# Patient Record
Sex: Male | Born: 1935 | Race: White | Hispanic: No | Smoking: Former smoker
Health system: Southern US, Community
[De-identification: ages and names within clinical notes are randomized; demographics above are authoritative.]

## PROBLEM LIST (undated history)

## (undated) DIAGNOSIS — C9200 Acute myeloblastic leukemia, not having achieved remission: Principal | ICD-10-CM

## (undated) DIAGNOSIS — D61818 Other pancytopenia: Secondary | ICD-10-CM

## (undated) DIAGNOSIS — L309 Dermatitis, unspecified: Secondary | ICD-10-CM

## (undated) DIAGNOSIS — I2510 Atherosclerotic heart disease of native coronary artery without angina pectoris: Secondary | ICD-10-CM

## (undated) DIAGNOSIS — M1990 Unspecified osteoarthritis, unspecified site: Secondary | ICD-10-CM

## (undated) DIAGNOSIS — N400 Enlarged prostate without lower urinary tract symptoms: Secondary | ICD-10-CM

## (undated) DIAGNOSIS — E785 Hyperlipidemia, unspecified: Secondary | ICD-10-CM

## (undated) DIAGNOSIS — M719 Bursopathy, unspecified: Secondary | ICD-10-CM

## (undated) DIAGNOSIS — N181 Chronic kidney disease, stage 1: Secondary | ICD-10-CM

## (undated) DIAGNOSIS — N200 Calculus of kidney: Secondary | ICD-10-CM

## (undated) DIAGNOSIS — S51009A Unspecified open wound of unspecified elbow, initial encounter: Secondary | ICD-10-CM

## (undated) DIAGNOSIS — D46Z Other myelodysplastic syndromes: Secondary | ICD-10-CM

## (undated) DIAGNOSIS — I10 Essential (primary) hypertension: Secondary | ICD-10-CM

## (undated) DIAGNOSIS — E039 Hypothyroidism, unspecified: Secondary | ICD-10-CM

## (undated) DIAGNOSIS — M549 Dorsalgia, unspecified: Secondary | ICD-10-CM

## (undated) HISTORY — DX: Dorsalgia, unspecified: M54.9

## (undated) HISTORY — DX: Benign prostatic hyperplasia without lower urinary tract symptoms: N40.0

## (undated) HISTORY — DX: Atherosclerotic heart disease of native coronary artery without angina pectoris: I25.10

## (undated) HISTORY — DX: Acute myeloblastic leukemia, not having achieved remission: C92.00

## (undated) HISTORY — DX: Calculus of kidney: N20.0

## (undated) HISTORY — DX: Bursopathy, unspecified: M71.9

## (undated) HISTORY — DX: Chronic kidney disease, stage 1: N18.1

## (undated) HISTORY — DX: Unspecified open wound of unspecified elbow, initial encounter: S51.009A

## (undated) HISTORY — PX: TRANSURETHRAL RESECTION OF PROSTATE: SHX73

## (undated) HISTORY — DX: Hypothyroidism, unspecified: E03.9

## (undated) HISTORY — DX: Other pancytopenia: D61.818

## (undated) HISTORY — DX: Hyperlipidemia, unspecified: E78.5

## (undated) HISTORY — DX: Other myelodysplastic syndromes: D46.Z

## (undated) HISTORY — DX: Dermatitis, unspecified: L30.9

## (undated) HISTORY — DX: Unspecified osteoarthritis, unspecified site: M19.90

## (undated) HISTORY — DX: Essential (primary) hypertension: I10

---

## 1966-12-25 HISTORY — PX: APPENDECTOMY: SHX54

## 1993-12-25 HISTORY — PX: CORONARY ARTERY BYPASS GRAFT: SHX141

## 2000-09-19 ENCOUNTER — Encounter: Payer: Self-pay | Admitting: Cardiology

## 2000-09-19 ENCOUNTER — Ambulatory Visit (HOSPITAL_COMMUNITY): Admission: RE | Admit: 2000-09-19 | Discharge: 2000-09-19 | Payer: Self-pay | Admitting: Cardiology

## 2001-08-23 ENCOUNTER — Encounter (INDEPENDENT_AMBULATORY_CARE_PROVIDER_SITE_OTHER): Payer: Self-pay

## 2001-08-23 ENCOUNTER — Other Ambulatory Visit: Admission: RE | Admit: 2001-08-23 | Discharge: 2001-08-23 | Payer: Self-pay | Admitting: Urology

## 2003-04-10 ENCOUNTER — Encounter: Payer: Self-pay | Admitting: Orthopedic Surgery

## 2003-04-17 ENCOUNTER — Ambulatory Visit (HOSPITAL_COMMUNITY): Admission: RE | Admit: 2003-04-17 | Discharge: 2003-04-17 | Payer: Self-pay | Admitting: Orthopedic Surgery

## 2004-03-15 ENCOUNTER — Ambulatory Visit (HOSPITAL_COMMUNITY): Admission: RE | Admit: 2004-03-15 | Discharge: 2004-03-15 | Payer: Self-pay | Admitting: Critical Care Medicine

## 2004-03-15 ENCOUNTER — Encounter (INDEPENDENT_AMBULATORY_CARE_PROVIDER_SITE_OTHER): Payer: Self-pay | Admitting: *Deleted

## 2004-03-22 ENCOUNTER — Emergency Department (HOSPITAL_COMMUNITY): Admission: EM | Admit: 2004-03-22 | Discharge: 2004-03-22 | Payer: Self-pay

## 2004-04-08 ENCOUNTER — Encounter (INDEPENDENT_AMBULATORY_CARE_PROVIDER_SITE_OTHER): Payer: Self-pay | Admitting: *Deleted

## 2004-04-08 ENCOUNTER — Observation Stay (HOSPITAL_COMMUNITY): Admission: RE | Admit: 2004-04-08 | Discharge: 2004-04-09 | Payer: Self-pay | Admitting: Urology

## 2004-04-12 ENCOUNTER — Emergency Department (HOSPITAL_COMMUNITY): Admission: EM | Admit: 2004-04-12 | Discharge: 2004-04-12 | Payer: Self-pay | Admitting: Emergency Medicine

## 2004-11-24 ENCOUNTER — Ambulatory Visit: Payer: Self-pay | Admitting: Internal Medicine

## 2004-12-02 ENCOUNTER — Ambulatory Visit: Payer: Self-pay | Admitting: Cardiology

## 2004-12-12 ENCOUNTER — Ambulatory Visit: Payer: Self-pay | Admitting: Cardiovascular Disease

## 2005-04-04 ENCOUNTER — Ambulatory Visit: Payer: Self-pay | Admitting: Critical Care Medicine

## 2005-12-21 ENCOUNTER — Ambulatory Visit: Payer: Self-pay | Admitting: Cardiology

## 2005-12-25 HISTORY — PX: TOTAL KNEE ARTHROPLASTY: SHX125

## 2006-02-26 ENCOUNTER — Ambulatory Visit: Payer: Self-pay

## 2006-03-15 ENCOUNTER — Ambulatory Visit: Payer: Self-pay | Admitting: Cardiology

## 2006-04-02 ENCOUNTER — Inpatient Hospital Stay (HOSPITAL_COMMUNITY): Admission: RE | Admit: 2006-04-02 | Discharge: 2006-04-12 | Payer: Self-pay | Admitting: Orthopedic Surgery

## 2006-04-10 ENCOUNTER — Ambulatory Visit: Payer: Self-pay | Admitting: Physical Medicine & Rehabilitation

## 2007-02-26 ENCOUNTER — Ambulatory Visit: Payer: Self-pay | Admitting: Cardiology

## 2007-03-11 ENCOUNTER — Ambulatory Visit: Payer: Self-pay

## 2007-11-28 ENCOUNTER — Encounter (HOSPITAL_BASED_OUTPATIENT_CLINIC_OR_DEPARTMENT_OTHER): Admission: RE | Admit: 2007-11-28 | Discharge: 2007-12-23 | Payer: Self-pay | Admitting: Surgery

## 2007-12-24 ENCOUNTER — Encounter (HOSPITAL_BASED_OUTPATIENT_CLINIC_OR_DEPARTMENT_OTHER): Admission: RE | Admit: 2007-12-24 | Discharge: 2008-03-23 | Payer: Self-pay | Admitting: Surgery

## 2008-02-12 ENCOUNTER — Encounter (HOSPITAL_COMMUNITY): Admission: RE | Admit: 2008-02-12 | Discharge: 2008-02-26 | Payer: Self-pay | Admitting: Internal Medicine

## 2008-03-12 ENCOUNTER — Ambulatory Visit: Payer: Self-pay | Admitting: Cardiology

## 2008-03-27 ENCOUNTER — Encounter (HOSPITAL_BASED_OUTPATIENT_CLINIC_OR_DEPARTMENT_OTHER): Admission: RE | Admit: 2008-03-27 | Discharge: 2008-06-25 | Payer: Self-pay | Admitting: Surgery

## 2008-04-09 ENCOUNTER — Ambulatory Visit: Payer: Self-pay | Admitting: Gastroenterology

## 2008-04-09 LAB — CONVERTED CEMR LAB: Tissue Transglutaminase Ab, IgA: 0.2 units (ref <7)

## 2008-04-23 ENCOUNTER — Encounter: Payer: Self-pay | Admitting: Gastroenterology

## 2008-04-23 ENCOUNTER — Ambulatory Visit: Payer: Self-pay | Admitting: Gastroenterology

## 2008-04-28 ENCOUNTER — Encounter: Payer: Self-pay | Admitting: Gastroenterology

## 2008-07-01 ENCOUNTER — Encounter: Payer: Self-pay | Admitting: Gastroenterology

## 2008-07-01 ENCOUNTER — Telehealth: Payer: Self-pay | Admitting: Gastroenterology

## 2008-07-02 ENCOUNTER — Ambulatory Visit: Payer: Self-pay | Admitting: Gastroenterology

## 2008-07-16 ENCOUNTER — Ambulatory Visit: Payer: Self-pay | Admitting: Gastroenterology

## 2008-07-17 ENCOUNTER — Encounter (HOSPITAL_BASED_OUTPATIENT_CLINIC_OR_DEPARTMENT_OTHER): Admission: RE | Admit: 2008-07-17 | Discharge: 2008-09-18 | Payer: Self-pay | Admitting: Internal Medicine

## 2008-07-22 ENCOUNTER — Encounter: Payer: Self-pay | Admitting: Gastroenterology

## 2008-07-22 ENCOUNTER — Telehealth: Payer: Self-pay | Admitting: Gastroenterology

## 2008-08-24 ENCOUNTER — Ambulatory Visit (HOSPITAL_COMMUNITY): Admission: RE | Admit: 2008-08-24 | Discharge: 2008-08-24 | Payer: Self-pay | Admitting: Internal Medicine

## 2008-09-24 ENCOUNTER — Encounter (HOSPITAL_BASED_OUTPATIENT_CLINIC_OR_DEPARTMENT_OTHER): Admission: RE | Admit: 2008-09-24 | Discharge: 2008-12-17 | Payer: Self-pay | Admitting: Internal Medicine

## 2008-11-06 ENCOUNTER — Encounter: Payer: Self-pay | Admitting: Gastroenterology

## 2008-12-22 ENCOUNTER — Encounter (HOSPITAL_BASED_OUTPATIENT_CLINIC_OR_DEPARTMENT_OTHER): Admission: RE | Admit: 2008-12-22 | Discharge: 2008-12-24 | Payer: Self-pay | Admitting: Internal Medicine

## 2008-12-24 ENCOUNTER — Encounter (HOSPITAL_BASED_OUTPATIENT_CLINIC_OR_DEPARTMENT_OTHER): Admission: RE | Admit: 2008-12-24 | Discharge: 2009-03-24 | Payer: Self-pay | Admitting: Internal Medicine

## 2009-03-18 ENCOUNTER — Encounter (HOSPITAL_BASED_OUTPATIENT_CLINIC_OR_DEPARTMENT_OTHER): Admission: RE | Admit: 2009-03-18 | Discharge: 2009-06-16 | Payer: Self-pay | Admitting: General Surgery

## 2009-03-20 DIAGNOSIS — I10 Essential (primary) hypertension: Secondary | ICD-10-CM | POA: Insufficient documentation

## 2009-03-20 DIAGNOSIS — N200 Calculus of kidney: Secondary | ICD-10-CM | POA: Insufficient documentation

## 2009-03-20 DIAGNOSIS — Z87898 Personal history of other specified conditions: Secondary | ICD-10-CM

## 2009-03-20 DIAGNOSIS — R0602 Shortness of breath: Secondary | ICD-10-CM

## 2009-03-20 DIAGNOSIS — E039 Hypothyroidism, unspecified: Secondary | ICD-10-CM | POA: Insufficient documentation

## 2009-03-20 DIAGNOSIS — D630 Anemia in neoplastic disease: Secondary | ICD-10-CM

## 2009-03-20 DIAGNOSIS — J4489 Other specified chronic obstructive pulmonary disease: Secondary | ICD-10-CM | POA: Insufficient documentation

## 2009-03-20 DIAGNOSIS — J209 Acute bronchitis, unspecified: Secondary | ICD-10-CM

## 2009-03-20 DIAGNOSIS — M1990 Unspecified osteoarthritis, unspecified site: Secondary | ICD-10-CM | POA: Insufficient documentation

## 2009-03-20 DIAGNOSIS — E785 Hyperlipidemia, unspecified: Secondary | ICD-10-CM

## 2009-03-20 DIAGNOSIS — D126 Benign neoplasm of colon, unspecified: Secondary | ICD-10-CM

## 2009-03-20 DIAGNOSIS — K644 Residual hemorrhoidal skin tags: Secondary | ICD-10-CM | POA: Insufficient documentation

## 2009-03-20 DIAGNOSIS — I2510 Atherosclerotic heart disease of native coronary artery without angina pectoris: Secondary | ICD-10-CM | POA: Insufficient documentation

## 2009-03-20 DIAGNOSIS — K219 Gastro-esophageal reflux disease without esophagitis: Secondary | ICD-10-CM

## 2009-03-20 DIAGNOSIS — J449 Chronic obstructive pulmonary disease, unspecified: Secondary | ICD-10-CM | POA: Insufficient documentation

## 2009-03-22 DIAGNOSIS — I10 Essential (primary) hypertension: Secondary | ICD-10-CM | POA: Insufficient documentation

## 2009-03-22 DIAGNOSIS — I25810 Atherosclerosis of coronary artery bypass graft(s) without angina pectoris: Secondary | ICD-10-CM | POA: Insufficient documentation

## 2009-04-01 ENCOUNTER — Encounter: Payer: Self-pay | Admitting: Cardiology

## 2009-04-01 ENCOUNTER — Ambulatory Visit: Payer: Self-pay | Admitting: Cardiology

## 2009-04-06 ENCOUNTER — Telehealth (INDEPENDENT_AMBULATORY_CARE_PROVIDER_SITE_OTHER): Payer: Self-pay | Admitting: *Deleted

## 2009-04-07 ENCOUNTER — Ambulatory Visit: Payer: Self-pay

## 2009-04-07 ENCOUNTER — Encounter: Payer: Self-pay | Admitting: Cardiology

## 2009-04-15 ENCOUNTER — Telehealth (INDEPENDENT_AMBULATORY_CARE_PROVIDER_SITE_OTHER): Payer: Self-pay | Admitting: *Deleted

## 2009-05-03 ENCOUNTER — Encounter: Payer: Self-pay | Admitting: Cardiology

## 2009-05-04 ENCOUNTER — Telehealth: Payer: Self-pay | Admitting: Cardiology

## 2009-06-24 ENCOUNTER — Encounter (HOSPITAL_BASED_OUTPATIENT_CLINIC_OR_DEPARTMENT_OTHER): Admission: RE | Admit: 2009-06-24 | Discharge: 2009-09-01 | Payer: Self-pay | Admitting: General Surgery

## 2009-09-29 ENCOUNTER — Encounter (HOSPITAL_BASED_OUTPATIENT_CLINIC_OR_DEPARTMENT_OTHER): Admission: RE | Admit: 2009-09-29 | Discharge: 2009-11-01 | Payer: Self-pay | Admitting: General Surgery

## 2009-10-28 ENCOUNTER — Telehealth: Payer: Self-pay | Admitting: Gastroenterology

## 2010-01-05 ENCOUNTER — Encounter (HOSPITAL_BASED_OUTPATIENT_CLINIC_OR_DEPARTMENT_OTHER): Admission: RE | Admit: 2010-01-05 | Discharge: 2010-04-05 | Payer: Self-pay | Admitting: Internal Medicine

## 2010-04-28 ENCOUNTER — Telehealth (INDEPENDENT_AMBULATORY_CARE_PROVIDER_SITE_OTHER): Payer: Self-pay | Admitting: *Deleted

## 2010-06-07 ENCOUNTER — Telehealth: Payer: Self-pay | Admitting: Cardiology

## 2010-07-20 ENCOUNTER — Encounter (HOSPITAL_BASED_OUTPATIENT_CLINIC_OR_DEPARTMENT_OTHER): Admission: RE | Admit: 2010-07-20 | Discharge: 2010-09-26 | Payer: Self-pay | Admitting: Internal Medicine

## 2010-08-04 ENCOUNTER — Ambulatory Visit (HOSPITAL_COMMUNITY): Admission: RE | Admit: 2010-08-04 | Discharge: 2010-08-04 | Payer: Self-pay | Admitting: General Surgery

## 2010-09-28 ENCOUNTER — Encounter (HOSPITAL_BASED_OUTPATIENT_CLINIC_OR_DEPARTMENT_OTHER)
Admission: RE | Admit: 2010-09-28 | Discharge: 2010-12-27 | Payer: Self-pay | Source: Home / Self Care | Attending: Internal Medicine | Admitting: Internal Medicine

## 2010-10-21 ENCOUNTER — Encounter: Payer: Self-pay | Admitting: Cardiology

## 2010-10-21 ENCOUNTER — Ambulatory Visit: Payer: Self-pay | Admitting: Cardiology

## 2010-11-24 ENCOUNTER — Ambulatory Visit (HOSPITAL_COMMUNITY)
Admission: RE | Admit: 2010-11-24 | Discharge: 2010-11-24 | Payer: Self-pay | Source: Home / Self Care | Admitting: Internal Medicine

## 2010-12-23 ENCOUNTER — Encounter
Admission: RE | Admit: 2010-12-23 | Discharge: 2010-12-23 | Payer: Self-pay | Source: Home / Self Care | Attending: Plastic Surgery | Admitting: Plastic Surgery

## 2010-12-25 HISTORY — PX: ELBOW ARTHROSCOPY: SHX614

## 2010-12-29 ENCOUNTER — Ambulatory Visit
Admission: RE | Admit: 2010-12-29 | Discharge: 2010-12-29 | Payer: Self-pay | Source: Home / Self Care | Attending: Plastic Surgery | Admitting: Plastic Surgery

## 2010-12-29 LAB — POCT HEMOGLOBIN-HEMACUE: Hemoglobin: 11.5 g/dL — ABNORMAL LOW (ref 13.0–17.0)

## 2011-01-09 LAB — TISSUE CULTURE
Culture: NO GROWTH
Gram Stain: NONE SEEN
Gram Stain: NONE SEEN

## 2011-01-24 NOTE — Progress Notes (Signed)
Summary: refill  Phone Note Refill Request Message from:  Patient on June 07, 2010 1:55 PM  Refills Requested: Medication #1:  TOPROL XL 100MG  1 tab qd [facility]  [phone] 90 supply  Initial call taken by: Judie Grieve,  June 07, 2010 1:56 PM    New/Updated Medications: METOPROLOL SUCCINATE 100 MG XR24H-TAB (METOPROLOL SUCCINATE) Take one tablet by mouth daily Prescriptions: METOPROLOL SUCCINATE 100 MG XR24H-TAB (METOPROLOL SUCCINATE) Take one tablet by mouth daily  #90 x 3   Entered by:   Kem Parkinson   Authorized by:   Lenoria Farrier, MD, [facility]   Signed by:   Kem Parkinson on 06/07/2010   Method used:   Electronically to        [facility]* (retail)             ,          Ph: [phone]       Fax: [phone]   RxID:   [id number]

## 2011-01-24 NOTE — Procedures (Signed)
Summary: EGD   EGD  Procedure date:  07/16/2008  Findings:      Location: [facility]    Patient Name: Kyle Brewer, Kyle Brewer MRN:  Procedure Procedures: Panendoscopy (EGD) CPT: 43235.    with esophageal dilation. CPT: G9296129.  Personnel: Endoscopist: Venita Lick. Russella Dar, MD, Clementeen Graham.  Exam Location: Exam performed in Outpatient Clinic. Outpatient  Patient Consent: Procedure, Alternatives, Risks and Benefits discussed, consent obtained, from patient. Consent was obtained by the RN.  Indications Symptoms: Dysphagia.  Surveillance of: Prior gastric ulcer.  History  Current Medications: Patient is not currently taking Coumadin.  Pre-Exam Physical: Performed Jul 16, 2008  Cardio-pulmonary exam, HEENT exam, Abdominal exam, Mental status exam WNL.  Comments: Pt. history reviewed/updated, physical exam performed prior to initiation of sedation?Yes Exam Exam Info: Maximum depth of insertion Duodenum, intended Duodenum. Vocal cords not visualized. Gastric retroflexion performed. Images taken. ASA Classification: II.  Sedation Meds: Patient assessed and found to be appropriate for moderate (conscious) sedation. Fentanyl 25 mcg. given IV. Versed 4 mg. given IV. Cetacaine Spray 2 sprays given aerosolized.  Monitoring: BP and pulse monitoring done. Oximetry used. Supplemental O2 given  Findings Normal: Proximal Esophagus to Mid Esophagus.  STRICTURE / STENOSIS: Distal Esophagus.  Constriction: partial. Lumen diameter is 14 mm. ICD9: Esophageal Stricture: 530.3.  - Dilation: Distal Esophagus. Savary dilator used, Diameter: 16 mm, Minimal Resistance, No Heme present on extraction. Savary dilator used, Diameter: 17 mm, Minimal Resistance, No Heme present on extraction. Patient tolerance excellent. Outcome: successful.  HIATAL HERNIA: Regular, 7 cms. in length. ICD9: Hernia, Hiatal: 553.3. Normal: Fundus to Proximal Esophagus. Not Seen: Ulcer.  Normal: Duodenal Bulb to  Duodenal 2nd Portion.   Assessment  Diagnoses: 553.3: Hernia, Hiatal.  530.3: Esophageal Stricture.  530.81: GERD.   Events  Unplanned Intervention: No unplanned interventions were required.  Unplanned Events: There were no complications. Plans Medication(s): Await pathology. PPI: Omeprazole/Prilosec 40 mg QAM, for indefinitely.   Patient Education: Patient given standard instructions for: Hiatal Hernia. Reflux. Stenosis / Stricture.  Disposition: After procedure patient sent to recovery. After recovery patient sent home.  Scheduling: Office Visit, to [employer]. Russella Dar, MD, Clementeen Graham, prn  Primary Care Provider, to Creola Corn, MD, as planned     cc.   John Russo,MD   This report was created from the original endoscopy report, which was reviewed and signed by the above listed endoscopist.

## 2011-01-24 NOTE — Assessment & Plan Note (Signed)
Summary: f1y   Visit Type:  1 yr f/u Primary Provider:  Dr. Timothy Lasso  CC:  pt c/o middle to upper back pain when he is cleaning house...sob at times...denies any cp....  History of Present Illness: Kyle Brewer is 77 years old and returns for management of CAD. In 1995 he underwent bypass surgery. In 2001 he had patent grafts. He had a negative Myoview scan last year with an ejection fraction of 64%.  He's done well over the past year with no chest pain or palpitations. He does have some shortness of breath with exertion but this has not changed.  His past medical history significant for hypertension hyperlipidemia and anemia.  Current Medications (verified): 1)  Calcium 600 Mg Tabs (Calcium) .Marland Kitchen.. 1 Tab Two Times A Day 2)  Aspirin 81 Mg Tbec (Aspirin) .... Take One Tablet By Mouth Daily 3)  Lorazepam 0.5 Mg Tabs (Lorazepam) .Marland Kitchen.. 1 Tab As Needed 4)  Alendronate Sodium 70 Mg Tabs (Alendronate Sodium) .Marland Kitchen.. 1 Tab Weekly 5)  Mobic 7.5 Mg Tabs (Meloxicam) .Marland Kitchen.. 1 Tab Two Times A Day 6)  Avodart 0.5 Mg Caps (Dutasteride) .Marland Kitchen.. 1 Cap At Bedtime 7)  Terazosin Hcl 10 Mg Caps (Terazosin Hcl) .Marland Kitchen.. 1 Cap At Bedtime 8)  Synthroid 50 Mcg Tabs (Levothyroxine Sodium) .Marland Kitchen.. 1 Tab At Bedtime 9)  Lisinopril 10 Mg Tabs (Lisinopril) .Marland Kitchen.. 1 Tab At Bedtime 10)  Simvastatin 20 Mg Tabs (Simvastatin) .Marland Kitchen.. 1 Tab At Bedtime 11)  Amlodipine Besylate 5 Mg Tabs (Amlodipine Besylate) .Marland Kitchen.. 1 Tab Once Daily 12)  Metoprolol Succinate 100 Mg Xr24h-Tab (Metoprolol Succinate) .... Take One Tablet By Mouth Daily  Allergies: 1)  ! Septra  Past History:  Past Medical History: Reviewed history from 03/31/2009 and no changes required. Current Problems:  AZOTEMIA, MILD (ICD-790.6) GERD (ICD-530.81) BENIGN PROSTATIC HYPERTROPHY, HX OF (ICD-V13.8) DYSPNEA (ICD-786.05) UNSPECIFIED ANEMIA (ICD-285.9) ASTHMATIC BRONCHITIS, ACUTE (ICD-466.0) COPD (ICD-496) EXTERNAL HEMORRHOIDS (ICD-455.3) COLONIC POLYPS (ICD-211.3) DEGENERATIVE  JOINT DISEASE (ICD-715.90) CAD (ICD-414.00) HYPERLIPIDEMIA (ICD-272.4) 1. Coronary artery disease status post coronary artery bypass graft     1994 now stable. 2. Good left ventricular function. 3. Hypertension. 4. Hyperlipidemia. 5. Degenerative joint disease. 6. Chronic anemia.  1. Coronary artery disease status post coronary artery bypass graft     1994 now stable. 2. Good left ventricular function. 3. Hypertension. 4. Hyperlipidemia. 5. Degenerative joint disease. 6. Chronic anemia.  NEPHROLITHIASIS (ICD-592.0) HYPOTHYROIDISM (ICD-244.9) HYPERTENSION (ICD-401.9)  Review of Systems       ROS is negative except as outlined in HPI.   Vital Signs:  Patient profile:   77 year old male Height:      70 inches Weight:      199 pounds BMI:     28.66 Pulse rate:   58 / minute Pulse rhythm:   irregular BP sitting:   116 / 72  (left arm) Cuff size:   large  Vitals Entered By: Danielle Rankin, CMA (October 21, 2010 3:26 PM)  Physical Exam  Additional Exam:  Gen. Well-nourished, in no distress   Neck: No JVD, thyroid not enlarged, no carotid bruits Lungs: No tachypnea, clear without rales, rhonchi or wheezes Cardiovascular: Rhythm regular, PMI not displaced,  heart sounds  normal, no murmurs or gallops, no peripheral edema, pulses normal in all 4 extremities. Abdomen: BS normal, abdomen soft and non-tender without masses or organomegaly, no hepatosplenomegaly. MS: No deformities, no cyanosis or clubbing   Neuro:  No focal sns   Skin:  no lesions    Impression &  Recommendations:  Problem # 1:  CAD (ICD-414.00) He is status post bypass surgery in 1995. He has had no chest pain and this problem is stable. The following medications were removed from the medication list:    Amlodipine Besylate 5 Mg Tabs (Amlodipine besylate) .Marland Kitchen... Take 1 tablet by mouth once a day    Metoprolol Succinate 100 Mg Xr24h-tab (Metoprolol succinate) .Marland Kitchen... Take 1 tablet by mouth once a day His updated  medication list for this problem includes:    Aspirin 81 Mg Tbec (Aspirin) .Marland Kitchen... Take one tablet by mouth daily    Lisinopril 10 Mg Tabs (Lisinopril) .Marland Kitchen... 1 tab at bedtime    Amlodipine Besylate 5 Mg Tabs (Amlodipine besylate) .Marland Kitchen... 1 tab once daily    Metoprolol Succinate 100 Mg Xr24h-tab (Metoprolol succinate) .Marland Kitchen... Take one tablet by mouth daily  Problem # 2:  HYPERLIPIDEMIA-MIXED (ICD-272.4) This is followed by his primary care physician. His updated medication list for this problem includes:    Simvastatin 20 Mg Tabs (Simvastatin) .Marland Kitchen... 1 tab at bedtime  Problem # 3:  HYPERTENSION, BENIGN (ICD-401.1) This is well controlled on current medications. The following medications were removed from the medication list:    Amlodipine Besylate 5 Mg Tabs (Amlodipine besylate) .Marland Kitchen... Take 1 tablet by mouth once a day    Metoprolol Succinate 100 Mg Xr24h-tab (Metoprolol succinate) .Marland Kitchen... Take 1 tablet by mouth once a day His updated medication list for this problem includes:    Aspirin 81 Mg Tbec (Aspirin) .Marland Kitchen... Take one tablet by mouth daily    Terazosin Hcl 10 Mg Caps (Terazosin hcl) .Marland Kitchen... 1 cap at bedtime    Lisinopril 10 Mg Tabs (Lisinopril) .Marland Kitchen... 1 tab at bedtime    Amlodipine Besylate 5 Mg Tabs (Amlodipine besylate) .Marland Kitchen... 1 tab once daily    Metoprolol Succinate 100 Mg Xr24h-tab (Metoprolol succinate) .Marland Kitchen... Take one tablet by mouth daily  Other Orders: EKG w/ Interpretation (93000)  Patient Instructions: 1)  Your physician recommends that you continue on your current medications as directed. Please refer to the Current Medication list given to you today. 2)  Your physician wants you to follow-up in: 1 year with Dr. Clifton James.  You will receive a reminder letter in the mail two months in advance. If you don't receive a letter, please call our office to schedule the follow-up appointment.

## 2011-01-24 NOTE — Progress Notes (Signed)
Summary: refill  Phone Note Refill Request Message from:  Patient on Apr 28, 2010 10:04 AM  Refills Requested: Medication #1:  AMLODIPINE BESYLATE 5 MG TABS Take 1 tablet by mouth once a day Send to [facility] [phone]  Initial call taken by: Judie Grieve,  Apr 28, 2010 10:05 AM  Follow-up for Phone Call        Rx faxed to pharmacy [facility]  Follow-up by: Oswald Hillock,  Apr 28, 2010 1:59 PM    Prescriptions: AMLODIPINE BESYLATE 5 MG TABS (AMLODIPINE BESYLATE) Take 1 tablet by mouth once a day  #90 x 3   Entered by:   Oswald Hillock   Authorized by:   Lenoria Farrier, MD, [facility]   Signed by:   Oswald Hillock on 04/28/2010   Method used:   Electronically to        [facility]* (mail-order)             ,          Ph: [phone]       Fax: [phone]   RxID:   [id number]

## 2011-02-03 NOTE — Op Note (Signed)
  NAMEJUAQUIN, Kyle Brewer               ACCOUNT NO.:  [id number]  MEDICAL RECORD NO.:  [id number]          PATIENT TYPE:  AMB  LOCATION:  [facility]                          FACILITY:  [facility]  PHYSICIAN:  [employer], DO      DATE OF BIRTH:  07-27-1927  DATE OF PROCEDURE:  12/29/2010 DATE OF DISCHARGE:                              OPERATIVE REPORT   PREOPERATIVE DIAGNOSIS:  Right elbow wound 2 x 3cm.  POSTOPERATIVE DIAGNOSIS:  Right elbow wound.  PROCEDURE:  Irrigation and debridement of right elbow wound with culture and primary closure.  ATTENDING SURGEON:  Wayland Denis, DO  ANESTHESIA:  General.  INDICATIONS FOR PROCEDURE:  The patient is an 77 year old who has had a chronic wound to the right elbow.  He has had infection in the past, it was not clear whether of not the infection was still present.  There was no obvious soft tissue infection but the bone was in question.  He was seen in preop holding, the wound was examined, and the decision was made for irrigation and debridement with possible primary closure and possible flap depending on the way that it looked and how the tissue responded.  The patient was in agreement and understood that he may have a wound dress postoperatively.  DESCRIPTION OF PROCEDURE:  The patient was taken to the operating room and placed on the operating room table in supine position.  General anesthesia was administered.  Once adequate, he was prepped and draped in usual sterile fashion.  A tourniquet was applied prior to the prepping just in case we need it.  Normal saline 3 L was used to irrigate the wound with an additional bag of antibiotic solution.  A #15 blade was used to make an incision at the proximal and distal edge of the wound and to clean up the skin edges.  The area was released.  It was quite scarred down.  It was difficult to assess tissue planes due to the scarring.  The bone was very visible and was rongeured. The first layer was  sent for micro and then a deeper layer was sent to micro as well.  The area closed with very little tension and therefore 3- 0 nylon was used to reapproximate the skin edges with horizontal mattress sutures and a 5-0 nylon just at the skin edge.  Xeroform was applied and a Kerlix.  The patient was awoken and taken to the recovery room in stable condition.     Wayland Denis, DO     CS/MEDQ  D:  12/29/2010  T:  12/29/2010  Job:  [id number]  Electronically Signed by Wayland Denis  on 02/03/2011 02:31:56 PM

## 2011-02-28 ENCOUNTER — Encounter (HOSPITAL_BASED_OUTPATIENT_CLINIC_OR_DEPARTMENT_OTHER): Payer: Medicare Other | Attending: Plastic Surgery

## 2011-02-28 DIAGNOSIS — Y929 Unspecified place or not applicable: Secondary | ICD-10-CM | POA: Insufficient documentation

## 2011-02-28 DIAGNOSIS — S51009A Unspecified open wound of unspecified elbow, initial encounter: Secondary | ICD-10-CM | POA: Insufficient documentation

## 2011-02-28 DIAGNOSIS — Z7982 Long term (current) use of aspirin: Secondary | ICD-10-CM | POA: Insufficient documentation

## 2011-02-28 DIAGNOSIS — Z87442 Personal history of urinary calculi: Secondary | ICD-10-CM | POA: Insufficient documentation

## 2011-02-28 DIAGNOSIS — Z8614 Personal history of Methicillin resistant Staphylococcus aureus infection: Secondary | ICD-10-CM | POA: Insufficient documentation

## 2011-02-28 DIAGNOSIS — I10 Essential (primary) hypertension: Secondary | ICD-10-CM | POA: Insufficient documentation

## 2011-02-28 DIAGNOSIS — M7020 Olecranon bursitis, unspecified elbow: Secondary | ICD-10-CM | POA: Insufficient documentation

## 2011-02-28 DIAGNOSIS — Z96659 Presence of unspecified artificial knee joint: Secondary | ICD-10-CM | POA: Insufficient documentation

## 2011-02-28 DIAGNOSIS — Z79899 Other long term (current) drug therapy: Secondary | ICD-10-CM | POA: Insufficient documentation

## 2011-02-28 DIAGNOSIS — X58XXXA Exposure to other specified factors, initial encounter: Secondary | ICD-10-CM | POA: Insufficient documentation

## 2011-03-06 LAB — BASIC METABOLIC PANEL
BUN: 18 mg/dL (ref 6–23)
CO2: 27 mEq/L (ref 19–32)
Calcium: 8.9 mg/dL (ref 8.4–10.5)
Chloride: 110 mEq/L (ref 96–112)
Creatinine, Ser: 1.4 mg/dL (ref 0.4–1.5)
GFR calc Af Amer: 59 mL/min — ABNORMAL LOW (ref >60)
GFR calc non Af Amer: 49 mL/min — ABNORMAL LOW (ref >60)
Glucose, Bld: 87 mg/dL (ref 70–99)
Potassium: 4.6 mEq/L (ref 3.5–5.1)
Sodium: 140 mEq/L (ref 135–145)

## 2011-03-27 ENCOUNTER — Encounter (HOSPITAL_BASED_OUTPATIENT_CLINIC_OR_DEPARTMENT_OTHER): Payer: Medicare Other | Attending: Plastic Surgery

## 2011-03-27 DIAGNOSIS — Y929 Unspecified place or not applicable: Secondary | ICD-10-CM | POA: Insufficient documentation

## 2011-03-27 DIAGNOSIS — X58XXXA Exposure to other specified factors, initial encounter: Secondary | ICD-10-CM | POA: Insufficient documentation

## 2011-03-27 DIAGNOSIS — I10 Essential (primary) hypertension: Secondary | ICD-10-CM | POA: Insufficient documentation

## 2011-03-27 DIAGNOSIS — Z8614 Personal history of Methicillin resistant Staphylococcus aureus infection: Secondary | ICD-10-CM | POA: Insufficient documentation

## 2011-03-27 DIAGNOSIS — Z87442 Personal history of urinary calculi: Secondary | ICD-10-CM | POA: Insufficient documentation

## 2011-03-27 DIAGNOSIS — M7020 Olecranon bursitis, unspecified elbow: Secondary | ICD-10-CM | POA: Insufficient documentation

## 2011-03-27 DIAGNOSIS — Z96659 Presence of unspecified artificial knee joint: Secondary | ICD-10-CM | POA: Insufficient documentation

## 2011-03-27 DIAGNOSIS — S51009A Unspecified open wound of unspecified elbow, initial encounter: Secondary | ICD-10-CM | POA: Insufficient documentation

## 2011-03-27 DIAGNOSIS — Z79899 Other long term (current) drug therapy: Secondary | ICD-10-CM | POA: Insufficient documentation

## 2011-03-27 DIAGNOSIS — Z7982 Long term (current) use of aspirin: Secondary | ICD-10-CM | POA: Insufficient documentation

## 2011-04-13 ENCOUNTER — Telehealth: Payer: Self-pay | Admitting: Cardiology

## 2011-04-13 NOTE — Telephone Encounter (Signed)
Amlodipine and metoprolol needs written rx for 90 days-pls mail to pt

## 2011-04-14 MED ORDER — AMLODIPINE BESYLATE 5 MG PO TABS: 5.0000 mg | ORAL_TABLET | Freq: Every day | ORAL | Status: DC

## 2011-04-14 MED ORDER — METOPROLOL SUCCINATE ER 100 MG PO TB24: 100.0000 mg | ORAL_TABLET | Freq: Every day | ORAL | Status: DC

## 2011-04-14 NOTE — Telephone Encounter (Signed)
Printed -- will give to [facility] for Dr Sanjuana Kava to sign --- Pt was Dr Regino Schultze but will now see Dr Sanjuana Kava

## 2011-04-18 MED ORDER — AMLODIPINE BESYLATE 5 MG PO TABS: 5.0000 mg | ORAL_TABLET | Freq: Every day | ORAL | Status: DC

## 2011-04-18 MED ORDER — METOPROLOL SUCCINATE ER 100 MG PO TB24: 100.0000 mg | ORAL_TABLET | Freq: Every day | ORAL | Status: DC

## 2011-04-26 ENCOUNTER — Encounter (HOSPITAL_BASED_OUTPATIENT_CLINIC_OR_DEPARTMENT_OTHER): Payer: Medicare Other | Attending: Plastic Surgery

## 2011-04-26 DIAGNOSIS — S51009A Unspecified open wound of unspecified elbow, initial encounter: Secondary | ICD-10-CM | POA: Insufficient documentation

## 2011-04-26 DIAGNOSIS — X58XXXA Exposure to other specified factors, initial encounter: Secondary | ICD-10-CM | POA: Insufficient documentation

## 2011-04-26 DIAGNOSIS — M7020 Olecranon bursitis, unspecified elbow: Secondary | ICD-10-CM | POA: Insufficient documentation

## 2011-05-09 NOTE — Assessment & Plan Note (Signed)
[facility]   NAME:  BREYLAN, LEFEVERS NO.:  [id number]   MEDICAL RECORD NO.:  [id number]      DATE OF BIRTH:  February 21, 1927   PHYSICIAN:  Maxwell Caul, M.D. VISIT DATE:  09/18/2008                                   OFFICE VISIT   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  I have been working on this solidly  for perhaps the last 2 months with Oasis and we have made good progress  in taking what was an oval-shaped longitudinal wound down to 2-3 smaller  wounds, however.  The one that we have labeled as the distal right wound  is a slit-like area that probes right down to the olecranon itself.  Nonetheless, we have been making progress here.   PHYSICAL EXAMINATION:  He is afebrile.  The two tinier wounds that we  have labeled, right distal lateral and right proximal, both are tiny  wounds but still present.  The major wound here was given a  nonexcisional debridement of some adherent eschar.  We reapplied Oasis  to wall of these areas.  Ultimately, I would like to try to put a  Apligraf folded into the slit-like wound and I will see if I can get a  piece of one soon perhaps tomorrow.  At that point in time, I will wrap  the entire area in an Ace wrap.   IMPRESSION:  Chronic olecranon bursitis related wounds.  The delay in  healing is related to chronic inflammation and a very chronic wound of  uncertain etiology.  Nevertheless, I have continued the Oasis with a  plan to try and put use off label Apligraf to the slit-like wound to  probe to bone.  There is no evidence of underlying infection.           ______________________________  Maxwell Caul, M.D.     MGR/MEDQ  D:  09/17/2008  T:  09/18/2008  Job:  [id number]

## 2011-05-09 NOTE — Assessment & Plan Note (Signed)
 HEALTHCARE                            CARDIOLOGY OFFICE NOTE   Kyle, Brewer                      MRN:          [id number]  DATE:03/12/2008                            DOB:          1927-02-02    PRIMARY CARE PHYSICIAN:  Dr. Creola Brewer.   CLINICAL HISTORY:  Kyle Brewer is 77 years old and returns for follow-up  management of his coronary heart disease.  He is a retired Engineer, drilling.  He had bypass surgery in 1994 by Dr. Andrey Brewer and had patent  grafts at catheterization in 1991.  He had a Myoview scan last year  which showed no evidence of ischemia and ejection fraction of 60%.   He has done quite well from standpoint of his heart.  He has had no  chest pain or palpitations.  He does have shortness of breath with  exertion, but this is a chronic problem and it has not changed.  He also  has had some slight edema in the lower extremities which had not  changed.   PAST MEDICAL HISTORY:  Significant for hypertension and hyperlipidemia.  He also has severe degenerative joint disease, and has had knee  replacement and hip replacement surgery.   CURRENT MEDICATIONS:  1. Toprol XL 100 mg daily.  2. Amlodipine 5 mg daily.  3. Zocor 20 mg daily.  4. Lisinopril 10 mg daily.  5. Synthroid.  6. Hytrin.  7. Avodart.  8. Fosamax.  9. Aspirin.  10.Calcium.  11.Iron.   PAST MEDICAL HISTORY:  Also significant for anemia which is partially  iron deficiency managed by Dr. Timothy Brewer.   PHYSICAL EXAMINATION:  VITAL SIGNS:  Blood pressure 140/80, pulse 76 and  regular.  He indicated his blood pressures have been 122/70 at home.  NECK:  There was no venous distension.  Carotid pulses were full without  bruits.  CHEST:  Was clear without rales or rhonchi.  HEART:  Rhythm is regular.  No murmurs or gallops.  ABDOMEN:  Soft with normal bowel sounds.  There is no  hepatosplenomegaly.  EXTREMITIES:  There is trace peripheral edema.  Pedal pulses are equal.   IMPRESSION:  1. Coronary artery disease status post coronary artery bypass graft      1994 now stable.  2. Good left ventricular function.  3. Hypertension.  4. Hyperlipidemia.  5. Degenerative joint disease.  6. Chronic anemia.   RECOMMENDATIONS:  I think [name] is doing quite well.  His labs by  Dr. Timothy Brewer showed improved hemoglobin up to or greater than 12 and a good  lipid profile.  Will plan to continue his current medical treatment and  see him back in follow-up in a year.     Kyle Kyle Lennox Juanda Chance, MD, [facility]  Electronically Signed    BRB/MedQ  DD: 03/12/2008  DT: 03/12/2008  Job #: [id number]

## 2011-05-09 NOTE — Assessment & Plan Note (Signed)
[facility]   NAME:  Kyle Brewer, Kyle Brewer NO.:  [id number]   MEDICAL RECORD NO.:  [id number]      DATE OF BIRTH:  01/25/1927   PHYSICIAN:  Maxwell Caul, M.D. VISIT DATE:  09/10/2008                                   OFFICE VISIT   [name] is a gentleman we have been following for a difficult wound on  his right elbow that resulted from some form of chronic olecranon  bursitis.  I have been working on this fairly solidly for the last 4 or  5 weeks with Oasis, and we have made good progress in taking what was an  oval-shaped longitudinal wound down to 2 smaller size wounds.  We  actually had some breakdown 2 weeks ago probably due to coexistent  cellulitis.  However, that wound has healed over today.  He is left with  both a what we have labeled a proximal and a distal area in the elbow.  The proximal area is tiny and always requires a nonselective debridement  to remove some slough, however, underneath this the wound bed looks  healthy.  The more distal wound is actually difficult and goes right  down to bone, although the surrounding tissue looks fairly healthy.  This is going to be a very difficult area to heal.   On examination, temperature 98.2.  Areas as essentially as described  above, the proximal wound had a small area of slough debrided.  Nothing  need a debriding in the distal area.   IMPRESSION:  Wounds on the right elbow secondary to chronic olecranon  bursitis.  Debridement of the wound as noted above.  We reapplied Oasis  to both of these areas.  I would like to get a small piece of an  Apligraf into the base of the distal wound to see if we could stimulate  some degree of granulation here as the Oasis does not seem to be helping  here.  I am optimistic that the right proximal area may close over.  For  now, I am going to continue with the Oasis in both of these areas.           ______________________________  Maxwell Caul, M.D.     MGR/MEDQ  D:  09/10/2008  T:  09/11/2008  Job:  [id number]

## 2011-05-09 NOTE — Assessment & Plan Note (Signed)
[facility]   NAME:  Kyle Brewer, Kyle Brewer               ACCOUNT NO.:  [id number]   MEDICAL RECORD NO.:  [id number]      DATE OF BIRTH:  February 03, 1927   PHYSICIAN:  Leonie Man, M.D.    VISIT DATE:  03/01/2009                                   OFFICE VISIT   PROBLEM:  Nonhealing right elbow wound secondary to chronic olecranon  bursitis.   The patient has been treated vigorously with Oasis and Apligrafs,  Regranex and Promogran as well as Hydrogel.  The wound is slowly closing  at this time.  He did have a recent episode of MRSA infection, treated  with doxycycline 100 mg b.i.d.  His most recent culture shows a few  squamous epithelial cells and PMNs and a few Gram-positive cocci in  pairs, no sensitives were shown.  Currently, the wounds measure 0.3 x  0.7 x 0.5 cm at the distal right elbow and more proximally 0.6 x 0.7 x  0.1.  There is no drainage.  There is no odor.  The base of the wound is  granulating satisfactorily.  Previous measurements were all greater than  1 cm, which shows continued healing.   We will continue him on current therapy with a bulky dressing and  hydrogel.   Follow up with him in 2 weeks.      Leonie Man, M.D.  Electronically Signed     PB/MEDQ  D:  03/01/2009  T:  03/01/2009  Job:  [id number]

## 2011-05-09 NOTE — Assessment & Plan Note (Signed)
[facility]   NAME:  Kyle Brewer, Kyle Brewer NO.:  [id number]   MEDICAL RECORD NO.:  [id number]      DATE OF BIRTH:  August 12, 1927   PHYSICIAN:  Maxwell Caul, M.D.      VISIT DATE:                                   OFFICE VISIT   [name] is a gentleman we have been following for a difficult wound on  his right elbow secondary to chronic olecranon bursitis of really  uncertain etiology.  I had some progress through the summer and fall of  2009 with Oasis in this wound.  However, the wound stalled.  We have  tried Regranex samples, collagen, and hydrogel.  I cultured the wound in  late February, it indeed grew MRSA.  We treated this with doxycycline  for 10 days.  We have also been using SilvaSorb gel, which should have  antibacterial properties.   On examination, the wound itself has now 2 areas.  The major one is the  distal area which has a depth to this.  Previously, this probed the  bone, although it does not do so.  The proximal wound is the smaller of  the areas and is roughly the same in size as last time, although the  overall appearance of this certainly looks better than when I last saw  this, however, at that point it was a single wound.   IMPRESSION:  Now 2 separate wounds with an Delaware of healing between  them.  The slit-like area is improved.  It does not probe the bone.  There is no drainage.  We continued the SilvaSorb gel with a nonadherent  dressing and a Kerlix wrap.  We will see him again in 2 weeks' time.           ______________________________  Maxwell Caul, M.D.     MGR/MEDQ  D:  03/15/2009  T:  03/15/2009  Job:  [id number]

## 2011-05-09 NOTE — Assessment & Plan Note (Signed)
[facility]   NAME:  VIRL, COBLE NO.:  [id number]   MEDICAL RECORD NO.:  [id number]      DATE OF BIRTH:  04-Jan-1927   PHYSICIAN:  Maxwell Caul, M.D.      VISIT DATE:                                   OFFICE VISIT   Mr. Dettore is a gentleman we have been following for refractory area of  wound on his right elbow secondary to chronic olecranon bursitis of  uncertain etiology with some degree of repetitive trauma.  When I first  saw this, he had a fairly large side wound over the entire surface of  the olecranon, which probed easily to bone.  We have been able to get  some resolution of this with lessening of the length of the wound and  some degree of granulation.  However, we have still been left with a  oval-shaped wound over the olecranon, which probes to bone in inferior  recess.  I had some success using Oasis with this.  However, we entered  a stalled phase.  There is no evidence of infection here.  Last week, I  changed him to Regranex with a hydrogel gauze.  We have had some  improvement in the wound in terms of the overall length.   On examination, the wound now measures 0.7 x 1.2 x 0.2, which again is  an improvement in the overall length of things.  However, the recess of  this which again is in the inferior aspect of the wound still probes  down to periosteum.   IMPRESSION:  Chronic wound on the right elbow in the setting of chronic  olecranon bursitis.  I would like to give him a month's trial of  Regranex.  I am hoping to see some granulation form over the periosteum.  So far, I am not seeing this either with the Oasis and/or now Regranex.  However, we are continuing to hope that this will granulate and that we  might get closure.  I would want to use an off-label piece of Apligraf  from another patient, however, this still might be an option.           ______________________________  Maxwell Caul, M.D.     MGR/MEDQ  D:  11/26/2008  T:  11/26/2008  Job:  [id number]

## 2011-05-09 NOTE — Assessment & Plan Note (Signed)
[facility]   NAME:  Kyle Brewer, Kyle Brewer               ACCOUNT NO.:  [id number]   MEDICAL RECORD NO.:  [id number]      DATE OF BIRTH:  27-Jul-1927   PHYSICIAN:  Leonie Man, M.D.    VISIT DATE:  05/31/2009                                   OFFICE VISIT   PROBLEM:  Chronic draining of right elbow wound.  This wound is  associated with an old bursitis with a chronic nonhealing area.  This  has had a history of MRSA in the past.  No recent cultures have been  done to see whether this has resolved.  However, the patient had a full  14-day course of antibiotic therapy with doxycycline.   The patient has been treated last with SilvaSorb.  This along with the  drainage has probably caused some maceration on the skin around the  wound.  Today, there is an area of redness and maceration in inferior  portion of the wound.  Wound measurements today are 1.0 x 0.2 x 0.2.  On  probing the wound, there is tunneling to approximately 1 cm.   We covered the area of maceration with skin protectant and placed a  SilvaSorb down within the wound cavity.  We will follow up with the  patient again in 1 week to assess his progress.  Temperature today is  97, pulse 64, respirations 18, and blood pressure is 118/74.      Leonie Man, M.D.  Electronically Signed     PB/MEDQ  D:  05/31/2009  T:  06/01/2009  Job:  [id number]

## 2011-05-09 NOTE — Assessment & Plan Note (Signed)
[facility]   NAME:  Kyle Brewer, Kyle Brewer               ACCOUNT NO.:  [id number]   MEDICAL RECORD NO.:  [id number]      DATE OF BIRTH:  27-Jun-1927   PHYSICIAN:  Leonie Man, M.D.    VISIT DATE:  04/26/2009                                   OFFICE VISIT   PROBLEM:  Chronic right elbow bursitis with nonhealing wound, now going  on over 15 months.  This has been healing very slowly.  The current  wound size is 1 cm x 0.3 cm x 1.5 cm.   Mr. Heims has been treated during the past several weeks with Promogran,  Kerlix, and Allevyn elbow pad.  The wound has been fairly stable and  unchanged over some period of time.   On examination today, the patient's temperature is 98.0, pulse 60,  respirations 21, blood pressure 136/71.  There is significant scarring  over the elbow with some surrounding reactive erythema but no evidence  of infection, specifically no swelling.  The base of the wound is clean,  and there is a little evidence of any increased granulations despite his  Promogran.   Treatment for this, we will continue Promogran, Kerlix, and Allevyn  elbow pad and to keep the wound moist, we will add some hydrogel.  I  will follow up with him.  This should be changed every 3 days and  followup will be in 2 weeks.      Leonie Man, M.D.  Electronically Signed     PB/MEDQ  D:  04/26/2009  T:  04/26/2009  Job:  [id number]

## 2011-05-09 NOTE — Group Therapy Note (Signed)
NAME:  Kyle Brewer, Kyle Brewer               ACCOUNT NO.:  [id number]   MEDICAL RECORD NO.:  [id number]          PATIENT TYPE:  REC   LOCATION:  FOOT                         FACILITY:  [facility]   PHYSICIAN:  Lenon Curt. Chilton Si, M.D.  DATE OF BIRTH:  Dec 17, 1927                                 PROGRESS NOTE   [name] is a 77 year old male who has been followed chronically for an  ulceration of the right elbow.  He has had Oasis applied for each of the  last 2 weeks and has noticed a substantial improvement in wound healing.  There is no bridging across the center of this wound and the appearance  of granulation tissue.  There is some slight undermining at the superior  proximal edge of this and at the inferior edge of it beyond the area of  bridging in the middle.  There is no pain associated, no significant  drainage.   IMPRESSION:  Patient has benefitted substantially from the application of Oasis.  It  was reapplied today and then covered with Steri-Strips and gauze and  then fishnet gauze on top of the bandage.  The patient was asked to  return in one week for further evaluation of his wound.      Lenon Curt Chilton Si, M.D.  Electronically Signed     AGG/MEDQ  D:  07/31/2008  T:  07/31/2008  Job:  [id number]

## 2011-05-09 NOTE — Assessment & Plan Note (Signed)
[facility]   NAME:  Kyle Brewer, Kyle Brewer               ACCOUNT NO.:  [id number]   MEDICAL RECORD NO.:  [id number]      DATE OF BIRTH:  12-20-1927   PHYSICIAN:  Theresia Majors. Tanda Rockers, M.D. VISIT DATE:  12/16/2007                                   OFFICE VISIT   SUBJECTIVE:  Kyle Brewer is a 77 year old man whom we are treating for an  infected bursa involving the right elbow.  During his last visit his  wound management was changed to eliminate the Oasis applications, and to  place a silver matrix dressing with hydrogel.  In the interim he reports  that there has been less drainage, less pain.  He continues to have a  normal range of motion of the elbow.  There has been no fever and no  malodor.   OBJECTIVE:  Blood pressure is 145/80, respirations 16, pulse rate 77,  temperature is 98.5.  The examination of the wound shows a decrease in the hyperemia.  There  is no drainage whatsoever.  The range of motion remains normal.  There  is evidence of advancing granulation from the 5 o'clock position of the  wound extending toward the center.  The fibrous tissue is still evident,  but there is no evidence of liquefaction or necrosis.  No debridement  was needed.   ASSESSMENT:  Clinical improvement of infected bursa.   PLAN:  We will continue the silver matrix dressing.  We will reevaluate  the patient in 1 week.      [name], M.D.  Electronically Signed     HAN/MEDQ  D:  12/16/2007  T:  12/16/2007  Job:  [id number]

## 2011-05-09 NOTE — Assessment & Plan Note (Signed)
[facility]   NAME:  Kyle Brewer, KISSOON               ACCOUNT NO.:  [id number]   MEDICAL RECORD NO.:  [id number]      DATE OF BIRTH:  [birth year]   PHYSICIAN:  Lenon Curt. Chilton Si, M.D.   VISIT DATE:  05/07/2009                                   OFFICE VISIT   HISTORY:  A 77 year old male with chronic lesion at the right elbow  returns today for a recheck.  This appears to be an old bursitis with a  chronic draining area and nonhealing area.  Drainage has increased some  and it is a little more sore today than recently.  The patient did have  a culture positive for MRSA on February 15, 2009.  This has not been  repeated since then.   PHYSICAL EXAMINATION:  VITAL SIGNS:  Temperature 98, pulse 64,  respirations 14, blood pressure 158/81.  SKIN:  Wound measurement of right elbow now 1.5 x 1.0 x 0.3 cm.  Small  overhanging flap of tissue at about the 6 to 8 o'clock position.  There  is a clear fluid drainage from this wound.  There is no odor to the  fluid.  There is minimal to mild erythema present.   TREATMENT:  Wound was cultured and he was started on doxycycline 100 mg  twice daily empirically pending return in the culture.  Wound was  bandaged with SilvaSorb, Adaptic, and Kerlix.  He is to repeat this  every 2-3 days.  He will return in clinic in 1-2 weeks for re-  inspection.   ICD-9:  707.01, ulcer of the elbow.   CPT Code:  54098.      Lenon Curt Chilton Si, M.D.  Electronically Signed     AGG/MEDQ  D:  05/07/2009  T:  05/08/2009  Job:  [id number]

## 2011-05-09 NOTE — Assessment & Plan Note (Signed)
[facility]   NAME:  Kyle Brewer, Kyle Brewer NO.:  [id number]   MEDICAL RECORD NO.:  [id number]      DATE OF BIRTH:  [birth year]   PHYSICIAN:  Maxwell Caul, M.D. VISIT DATE:  10/15/2008                                   OFFICE VISIT   Kyle Brewer is a 77 year old man with chronic wound in the right elbow  related to chronic olecranon bursitis of uncertain etiology.  We have  been applying Oasis to his right elbow.  We have actually had good  results and what was an initially stalled wound and we are seeing him  today in followup.   On examination, all of the areas except for the slit-like area that was  initially the center part of the original larger wound are closed.  The  base of the slit-like wound no longer probes easily to bone and I think  everything is improving here.  The surrounding tissue inflammation  remains the same.  There is no evidence of infection.  Nothing needed  culturing or deriding.   IMPRESSION:  Wound, right elbow related to chronic olecranon bursitis.  Although, I have wondered about other dressing types.  I think the  continued response to Oasis is there.  Therefore, this was reapplied  with packing into the slit-like space with Oasis.  We applied hydrogel,  Mepitel, fishnet, and Kerlix.  I will see him again in a week.           ______________________________  Maxwell Caul, M.D.     MGR/MEDQ  D:  10/15/2008  T:  10/15/2008  Job:  [id number]

## 2011-05-09 NOTE — Assessment & Plan Note (Signed)
[facility]   NAME:  Kyle Brewer, Kyle Brewer               ACCOUNT NO.:  [id number]   MEDICAL RECORD NO.:  [id number]      DATE OF BIRTH:  [birth year]   PHYSICIAN:  Theresia Majors. Tanda Rockers, M.D. VISIT DATE:  03/02/2008                                   OFFICE VISIT   SUBJECTIVE:  [name] is a 77 year old man whom we are following for  ulcerative bursitis involving his right elbow.  In the interim, he has  been using matrix dressing which he has changed every other day.  He has  not showered or cleansed this wound except with normal saline.  There  has been no interim fever.  He continues to have a full range of motion.  His pain is well managed on the current regimen.   OBJECTIVE:  Blood pressure is 145/79, respirations of 20, pulse rate 71,  temperature is 99.  Inspection of the wound shows constriction of the ulcer bed itself.  There is healthy-appearing granulation with areas of fibrous elbow  capsule.  This area is not desiccated.  There is no evidence of extreme  dehydration.  The periwound area is clean and healthy in appearance.  There is a reproducible pain on palpation of the wound, but there is no  abscess or malodorous drainage.   ASSESSMENT:  Clinical improvement.   PLAN:  We have instructed the patient to use the matrix dressing more  appropriately to the package insert.  I.e., the patient will change this  dressing every 3-4 days.  Prior to the dressing change, he will take a  thorough shower using antiseptic soap.  If he notices any excessive  drainage, redness, or pain.  He will call the clinic for an interim  appointment.  Otherwise we will reevaluate him in 2 weeks.   We have given the patient opportunity to ask questions.  He seems to  understand the instructions and indicates that he will be compliant.      [name], M.D.  Electronically Signed     HAN/MEDQ  D:  03/02/2008  T:  03/02/2008  Job:  [id number]

## 2011-05-09 NOTE — Assessment & Plan Note (Signed)
[facility]   NAME:  LADARRIAN, ASENCIO               ACCOUNT NO.:  [id number]   MEDICAL RECORD NO.:  [id number]      DATE OF BIRTH:  1927/03/21   PHYSICIAN:  Theresia Majors. Tanda Rockers, M.D. VISIT DATE:  12/09/2007                                   OFFICE VISIT   SUBJECTIVE:  Mr. Haskew is a 77 year old man who has been followed for a  chronic ulceration involving the right elbow.  He was last seen on  December 02, 2007 and was treated with Oasis, hydrogel, and a moist  dressing with a bulky wrap.  In the interim there has been no excessive  drainage malodor pain or fever.  He continues to use the elbow without  restriction of activity.  His only complaint is that he has excruciating  pain when he bumps the elbow.   OBJECTIVE:  Blood pressure is 139/104, respirations 16, pulse rate 67,  temperature 98.3.  Inspection of the right elbow shows a penetrating ulcer extending down  to the capsule.  The capsule is glistening white, appears to be healthy  in appearance.  There is a centimeter of undermining circumstantially.  There is a halo of erythema.  There is no malodorous drainage.  The  wound was cultured.  A punctate area located at 4 o'clock was incised  and the wound was converted into a single oval configuration.  The  neurovascular exam of the right upper extremity is normal.  There is no  associated axillary adenopathy.   ASSESSMENT:  Chronic ulceration with a history of infected right  olecranon bursa.  At present this wound does not clinically appear to be  infected.  We have cultured the wound.  We will discontinue the use of  the Oasis protocol for a lack of appropriate granulation in this wound.  Rather we will stimulate the lateral migration with a silver matrix  dressing.  If the culture remains negative, we will discontinue the  silver and institute a promagran dressing with appropriate moisturizing.  We have explained this approach to the patient in terms that  he seems to  understand.  We anticipate that this wound will eventually heal over a  protracted length of time.  Our key therapeutic objectives will be to  maintain a healthy wound environment avoiding infection and continuing  to maintain an adequate range of motion.      [name], M.D.  Electronically Signed     HAN/MEDQ  D:  12/09/2007  T:  12/09/2007  Job:  [id number]

## 2011-05-09 NOTE — Assessment & Plan Note (Signed)
[facility]   NAME:  OSMANY, AZER               ACCOUNT NO.:  [id number]   MEDICAL RECORD NO.:  [id number]      DATE OF BIRTH:  02-02-1927   PHYSICIAN:  Theresia Majors. Tanda Rockers, M.D. VISIT DATE:  04/13/2008                                   OFFICE VISIT   SUBJECTIVE:  Mr. Gariepy is a 77 year old man who we followed for  ulcerative bursitis involving his right elbow.  In the interim, he has  used the moist Moist dressing with daily antibacterial over-the-counter  soap washes.  There has been no excessive drainage, malodor, pain, or  fever.   OBJECTIVE:  VITAL SIGNS:  Blood pressure is 130/71, respirations 18, and  pulse rate 61.  EXTREMITIES:  Inspection of the right elbow shows continued contraction  of the wound.  There is now 98% granulating base.  The edges of the  wound, although, rolled up, continuing to contract.  There is a full  range of motion at the elbow joint with no evidence of effusion or  drainage.   ASSESSMENT:  Clinical improvement.   PLAN:  We will continue the daily antibacterial soap washes with the  moist Moist dressing and a fishnet to hold 4 x 4 in place.  We will re-  evaluate the patient in 2 weeks p.r.n.      [name], M.D.  Electronically Signed     HAN/MEDQ  D:  04/13/2008  T:  04/13/2008  Job:  [id number]

## 2011-05-09 NOTE — Assessment & Plan Note (Signed)
[facility]   NAME:  ZIERE, DOCKEN               ACCOUNT NO.:  [id number]   MEDICAL RECORD NO.:  [id number]      DATE OF BIRTH:  [birth year]   PHYSICIAN:  Theresia Majors. Tanda Rockers, M.D. VISIT DATE:  02/03/2008                                   OFFICE VISIT   SUBJECTIVE:  Mr. Ashe is a 77 year old man who we are following for a  right elbow ulcerative bursitis.  Most recently we have treated him with  open irrigation and Promogran applications.  He reports that there has  been no decrease in the range of motion.  There has been no fever and no  excessive pain.  The drainage has been minuscule.   OBJECTIVE:  VITALS:  Blood pressure is 120/72, respirations 16, pulse  rate 73, temperature is 98.3.  EXTREMITIES:  Inspection of the elbow shows that the wound continues to  contract. The fibrous tissue is being slowly covered by granulation.  There is a normal range of motion.  There is no periwound erythema,  cellulitis or drainage.   ASSESSMENT:  Clinical improvement.   PLAN:  We will continue the Promogran dressing every 3 days.  We will  have the patient evaluated by the nurse in 1 week and by the physician  in 2 weeks.      [name], M.D.  Electronically Signed     HAN/MEDQ  D:  02/03/2008  T:  02/03/2008  Job:  [id number]

## 2011-05-09 NOTE — Assessment & Plan Note (Signed)
[facility]   NAME:  CHANDLER, SWIDERSKI               ACCOUNT NO.:  [id number]   MEDICAL RECORD NO.:  [id number]      DATE OF BIRTH:  1927-01-15   PHYSICIAN:  Theresia Majors. Tanda Rockers, M.D. VISIT DATE:  12/30/2007                                   OFFICE VISIT   SUBJECTIVE:  Mr. Aldaco is a 77 year old man who we are following for an  infected bursa involving his left elbow.  In the interim we have treated  him with Aquacel Silver and Hydrogel.  He continues to have a normal  range of motion.  He denies fever or malodor.  He is having some trouble  keeping the dressing in place due to his motion.   OBJECTIVE:  Blood pressure is 125/72, respiratory rate 18, pulse rate  74, temperature 97.1.  Inspection of the wound shows that there is  additional granulation developing at between 2 and 3 o'clock.  The  previously noted granulation at the 6 o'clock position has increased.  The exposed capsule appears to be clean and not infected.  There is no  drainage.  There is no periwound cellulitis or abscess.   ASSESSMENT:  Improving wound.   PLAN:  Will continue Aquacel Silver and Hydrogel.  Re-evaluate the  patient in one week.      [name], M.D.  Electronically Signed     HAN/MEDQ  D:  12/30/2007  T:  12/30/2007  Job:  [id number]

## 2011-05-09 NOTE — Assessment & Plan Note (Signed)
[facility]   NAME:  Kyle Brewer, Kyle Brewer NO.:  [id number]   MEDICAL RECORD NO.:  [id number]      DATE OF BIRTH:  November 13, 1927   PHYSICIAN:  Maxwell Caul, M.D.      VISIT DATE:                                   OFFICE VISIT   [name] is a gentleman we have been following for most of this year  for a wound related to chronic bursitis over his right elbow.  In any  case, he has been left with a chronic wound with likely surrounding  inflammation.  There is no evidence of gout.  He does not describe pain  in the area.  When I saw him on July 20, 2008, he had been washing this  with antibacterial soap and applying protective dressings.  This did not  seem to help.  The wound was dry and although there was granulation,  there was certainly no epithelialization.   We applied Oasis first on July 20, 2008.  He arrives for a followup  placement and then this will be done weekly for the next several weeks.   On examination, his temperature is 98.1, pulse 62, respirations 18, and  blood pressure 118/72.  The wound measure is an irregular wound over the  extensor surfaces elbow measuring 2.8 x 2.5 x 0.3.  This was  anesthetized with EMLA cream and the wound was lightly debrided to  remove some light adherent eschar.  Underneath the base of the wound  appears to be well granulated.  There is some epithelium moving from the  medial side which is indeed gratifying.  There is also some overhang  measuring perhaps 1.5 cm superiorly from roughly 10 to 2 o'clock.   After debridement, we applied the Oasis with careful attention to pack  the superior area with Oasis.  After the Oasis was applied, we applied  saline, Mepitel, hydrogel over the Mepitel and wrapped this in Kerlix  and fishnet gauze.  This will be changed next week and I think this  should continue for the next several weeks to see if we have any effect.            ______________________________  Maxwell Caul, M.D.     MGR/MEDQ  D:  07/24/2008  T:  07/24/2008  Job:  [phone]

## 2011-05-09 NOTE — Assessment & Plan Note (Signed)
[facility]   NAME:  COE, ANGELOS               ACCOUNT NO.:  [id number]   MEDICAL RECORD NO.:  [id number]      DATE OF BIRTH:  03/27/1927   PHYSICIAN:  Joanne Gavel, M.D.         VISIT DATE:  06/11/2009                                   OFFICE VISIT   HISTORY OF PRESENT ILLNESS:  Chronic right elbow wound, evidently from a  bursitis operation.  MRSA was detected.  The patient was treated with  rifampin and tetracycline.  The wound has been packed with silver  alginate.   PHYSICAL EXAMINATION:  Temperature 98.4, pulse 56, respirations 20,  blood pressure 130/74.  The elbow wound is just a slit now, 0.8 x 0.2 x  0.2.  It can barely be probed.   IMPRESSION:  Marked improvement.   PLAN:  Dress with gauze and Kerlix.  See in 2 weeks.  I believe the  wound is almost healed.      Joanne Gavel, M.D.  Electronically Signed     RA/MEDQ  D:  06/11/2009  T:  06/12/2009  Job:  [id number]

## 2011-05-09 NOTE — Assessment & Plan Note (Signed)
[facility]   NAME:  Kyle Brewer, Kyle Brewer NO.:  [id number]   MEDICAL RECORD NO.:  [id number]      DATE OF BIRTH:  Apr 14, 1927   PHYSICIAN:  Maxwell Caul, M.D. VISIT DATE:  07/20/2008                                   OFFICE VISIT   [name] is a gentleman we had been following for most of this year who  has a chronic bursitis over his right elbow.  I think, he had a surgical  procedure in the area.  In any case, he has been left with a chronic  wound.  He has had this biopsied in the past, which showed chronic  inflammation.  No evidence of gout.  The patient does not describe pain  to the area; however, he is frustrated by the lack of progress in this  wound.  He has been continuing b.i.d. antiseptic soap washes and a dry  dressing.   On examination, temperature is 98.4, pulse 61, respirations 16, and  blood pressure 125/75.  Over the olecranon of the right elbow, is a very  pale, dry wound.  There is surrounding thick, inflamed skin that is not  particularly painful.  The wound itself is not draining.  There is no  evidence of cellulitis.   IMPRESSION:  Surgical wound elbow, likely secondary to ulcerative  bursitis.  This wound is in a stalled phase.  I think this basically has  some degree of surrounding inflammation of uncertain etiology and some  degree of the wound bed being excessively dry.  Going back to what I  think Dr. Cheryll Cockayne thought initially, I have applied Oasis to this wound  with hydrogel covered with Mepitel and a fish net.  I would like to give  this probably most of the next month to see if we can stimulate any  healing here.  I also think the superior aspect of this wound may need  to be debrided.  However, I did not do this today.  We moved to an Oasis  protocol as noted above.  I will change this again on Friday and then it  can be changed again in a week's time.           ______________________________  Maxwell Caul, M.D.     MGR/MEDQ  D:  07/20/2008  T:  07/21/2008  Job:  [id number]

## 2011-05-09 NOTE — Assessment & Plan Note (Signed)
[facility]   NAME:  Kyle Brewer, Kyle Brewer               ACCOUNT NO.:  [id number]   MEDICAL RECORD NO.:  [id number]      DATE OF BIRTH:  27-Sep-1927   PHYSICIAN:  Theresia Majors. Tanda Rockers, M.D. VISIT DATE:  04/27/2008                                   OFFICE VISIT   SUBJECTIVE:  [name] is a 77 year old man whom we are following for  ulcerative bursitis involving the right elbow.  In the interim, we have  treated him with daily antibacterial soap washing and moist-moist  dressing with a tube fish-net gauze.  There has been no fever.  No  excessive drainage or malodor.  He continues to have normal range of  motion in the elbow.   OBJECTIVE:  VITAL SIGNS:  Blood pressure is 120/72, respirations 18,  pulse rate 61, and temperature is 98.7.  EXTREMITIES:  Inspection of the right elbow shows that there is indeed a  normal range of motion.  There is no evidence of local edema, hyperemia,  or axillary adenopathy.  The wound itself has contracted nicely with  100% granulation.  No evidence of significant undermining, drainage, or  malodor.  The radial pulses are readily palpable.   ASSESSMENT:  Clinical improvement and resolution of ulcerative bursitis.   PLAN:  We will continue the current regimen of once to twice daily  antibacterial soap washings and 4 x 4.  We will re-evaluate the patient  in 1 month or p.r.n.  We anticipate that this wound should continue to  contract.  Nevertheless, if the patient develops local swelling,  drainage, malodor, pain, or fever; he is to call the clinic for an  interim appointment.  Otherwise, we will see him in 1 month.  We have  given the patient opportunity to ask questions.  He seems to understand  these instructions and indicates that he will be compliant.      [name], M.D.  Electronically Signed     HAN/MEDQ  D:  04/27/2008  T:  04/27/2008  Job:  [id number]

## 2011-05-09 NOTE — Assessment & Plan Note (Signed)
[facility]   NAME:  Kyle Brewer, Kyle Brewer NO.:  [id number]   MEDICAL RECORD NO.:  [id number]      DATE OF BIRTH:  1927-05-08   PHYSICIAN:  Maxwell Caul, M.D. VISIT DATE:  11/05/2008                                   OFFICE VISIT   HISTORY:  Kyle Brewer is a gentleman with a difficult wound on his right  elbow secondary to chronic olecranon bursitis and probably repetitive  trauma.  He had a fairly large size wound over the entire surface of the  olecranon when I first saw him.  We used Oasis on this and got a  considerable degree of closure, however, recently he has been in a  stalled phase.  We have switched to a collagen-based dressing with  hydrogel and he returns today in followup.   PHYSICAL EXAMINATION:  The wound opening actually is considerably  smaller at 1 x 0.5 x 0.2, although I think this still is probing down to  bone.  He is not having any drainage or pain.   IMPRESSION:  Wound related to chronic olecranon bursitis.  The patient  actually has a large supply of Promogran at home.  We have given him  some hydrogel.  I would like to try and pack this small open area and  the wound surface.  He was going to continue to wrap this with a Kerlix  and fish net.  He can change the dressing every 2-3 days.  We will see  him again in 2 weeks time.           ______________________________  Maxwell Caul, M.D.     MGR/MEDQ  D:  11/05/2008  T:  11/05/2008  Job:  [id number]

## 2011-05-09 NOTE — Assessment & Plan Note (Signed)
[facility]   NAME:  ANA, LIAW NO.:  [id number]   MEDICAL RECORD NO.:  [id number]      DATE OF BIRTH:  01/03/1927   PHYSICIAN:  Maxwell Caul, M.D. VISIT DATE:  08/10/2008                                   OFFICE VISIT   Mr. Welter returns today in followup for his chronic wound on his right  elbow.  The exact etiology of this appears to have been on a chronic  olecranon bursitis.  I applied Oasis for the first time on July 22, 2008  and he has noted substantial improvement on this.  He has had no  discomfort.  He has not had excessive drainage.  This was last changed  on July 31, 2008.   PHYSICAL EXAMINATION:  Temperature is 98.6, pulse 69, respirations 20,  and blood pressure 111/70.  The original wound which was a oval-shaped  wound over his elbow was now on separate islands.  One of these is  proximal and one of these is distal.  Both of these have had  considerable improvement.  However, the proximal one has some overhang  at 9 o'clock as you look at the small circle.  The distal one has more  depth to it.  In between these two open areas, there has been complete  epithelialization.   IMPRESSION:  Chronic wound, likely secondary to chronic olecranon  bursitis which has improved substantially with Oasis.  I reapplied this  to both of the wound areas today covered with Steri-Strip, Mepitel, and  fish neck gauze.  This will be looked out again in a week.  I am hopeful  that we will see further improvement in both of these wound areas.           ______________________________  Maxwell Caul, M.D.     MGR/MEDQ  D:  08/10/2008  T:  08/10/2008  Job:  [id number]

## 2011-05-09 NOTE — Assessment & Plan Note (Signed)
[facility]   NAME:  Kyle Brewer, Kyle Brewer               ACCOUNT NO.:  [id number]   MEDICAL RECORD NO.:  [id number]      DATE OF BIRTH:  [birth year]   PHYSICIAN:  Theresia Majors. Tanda Rockers, M.D. VISIT DATE:  01/06/2008                                   OFFICE VISIT   SUBJECTIVE:  [name] is a 77 year old man who we are following for an  infected bursa.  We have been treating the wound open with a silver  matrix dressing and hydrogel.  There has been no interim fever.  The  patient continues to be active with no restrictions in his range of  motion.   OBJECTIVE:  Blood pressure is 117/65, respirations 18, pulse rate 67,  temperature 98.1.  Inspection of the wound shows that there continues to  be advancing granulation, the fibrous capsule of the bursa remains  prominent in the wound but there is clear advancing epithelium.  There  is no active infection or hyperemia, debridement is not needed.   ASSESSMENT:  Definite improvement of the wound.   PLAN:  We will continue Aquacel silver with hydrogel, 4x4 and fishnet  gauze.  We will re-evaluate the patient in 1 week p.r.n.      [name], M.D.  Electronically Signed     HAN/MEDQ  D:  01/06/2008  T:  01/06/2008  Job:  [id number]

## 2011-05-09 NOTE — Assessment & Plan Note (Signed)
[facility]   NAME:  Kyle Brewer, Kyle Brewer NO.:  [id number]   MEDICAL RECORD NO.:  [id number]      DATE OF BIRTH:  [birth year]   PHYSICIAN:  Maxwell Caul, M.D. VISIT DATE:  08/24/2008                                   OFFICE VISIT   Kyle Brewer returns today in followup for his chronic wound on his right  elbow.  The exact etiology of this appears to have been some form of  chronic olecranon bursitis with some swelling and inflammation over this  area for many years.  I have been applying an Oasis to this for roughly  a month now and there was considerable improvement in the open area of  this wound, which is now in two separate islands.  He noted a 2 or 3  days ago that he developed increasing discomfort and minimal drainage.   On examination, his temperature was 98.2.  The elbow itself has two open  areas with a proximal area measuring 0.6 x 0.6 x 0.3.  The right distal  elbow measured 1.3 x 0.3 x 0.3, which is a linear area with some depth  to it.  Over the flap of tissue that covers the right distal elbow area  was a new open area measuring 0.6 x 0.7 x 0.2.  The whole area has  surrounding inflammation.  I think related to chronic olecranon  bursitis.  This is not really changed all that much.   IMPRESSIONS:  Wounds on the right elbow related to chronic olecranon  bursitis.  We had made some improvements here; however, the pain and  swelling has increased in this area.  I do a culture of the distal  linear area.  The right proximal lesion was debrided for a mild amount  of adherent eschar.  The new area was also debrided of some fibrous  adherent eschar.  I have started him on doxycycline empirically with a  worry of a possible infection here.  I will also send him for an x-ray  of his elbow to look at the underlying olecranon to make sure there is  no underlying infection or other etiologies, and I need to be aware of.   At this point, I  am really uncertain about progress.  We are making  clearly the Oasis had an abrupt response and tissue that covered large  portion of his wound; however, we have been left with a two islands and  now with a recent deterioration, I am uncertain about the benefit never  did the last I did, the reapply Oasis to both of the major wound areas.  We will see him again next week.  I will look forward to the x-ray and  the culture on doxycycline.           ______________________________  Maxwell Caul, M.D.     MGR/MEDQ  D:  08/24/2008  T:  08/25/2008  Job:  [id number]

## 2011-05-09 NOTE — Assessment & Plan Note (Signed)
[facility]   NAME:  DHANUSH, JOKERST NO.:  [id number]   MEDICAL RECORD NO.:  [id number]      DATE OF BIRTH:  [birth year]   PHYSICIAN:  Maxwell Caul, M.D.      VISIT DATE:                                   OFFICE VISIT   Mr. Voeltz is a 77 year old man with a chronic wound in his right elbow  related to chronic olecranon bursitis of uncertain etiology.  We have  been applying Oasis to his right elbow and actually have had some good  results with what was initially a stalled wound and we are seeing him  today in followup.  When he was seen here a week ago, actually things I  thought looked quite a bit better.  Today, the measurements have  increased.  He still has chronic pain in the wound, but no drainage.  At  least in terms of last 2 factors, that has not changed.   PHYSICAL EXAMINATION:  His temperature is 98.1.  The area on the elbow  in terms of dimensions has actually increased.  He has an oval slit-like  area that probes down to his olecranon.  There is better attempt the  granulation here, although we are not seeing progressive improvement.   IMPRESSION:  Wound, right elbow related to chronic olecranon bursitis.  Although, I have waxed and waned of my feelings about this.  I actually  am at the point now where I think we have gained as much as we can from  weekly applications of Oasis.  Although, I had wanted to try an Apligraf  here, this would strictly be off label and I do not think I am going to  be able to do this.  The only alternative I can think of is Regranex.  The patient balked at the out-of-pocket expense for this, although we  will check and see if this might be covered by his insurance.   Short of the Regranex, I think I would try a collagen-based dressing  again.  We may be into a palliative situation here.  I really do not  want to look at making more additional expense for this gentleman.  If  we have done  everything, we can do here.  There is no evidence of  infection.  No evidence of underlying osteomyelitis.  There is simply a  chronic inflammation.  I think that it is an impediment to healing.           ______________________________  Maxwell Caul, M.D.     MGR/MEDQ  D:  10/22/2008  T:  10/22/2008  Job:  [id number]

## 2011-05-09 NOTE — Assessment & Plan Note (Signed)
[facility]                         GASTROENTEROLOGY OFFICE NOTE   BRODEY, BONN                      MRN:          [id number]  DATE:04/22/2008                            DOB:          Jan 05, 1927    REQUESTING PHYSICIAN:  Dr. Timothy Lasso.   REASON FOR CONSULTATION:  Iron deficiency anemia and a personal history  of adenomatous colon polyps.   HISTORY OF PRESENT ILLNESS:  [name] is a 77 year old white male,  previously evaluated by Dr. Victorino Dike for hematochezia, abdominal  pain, and bloating. He underwent colonoscopy with polypectomy in  November 2003 showing diverticulosis, external hemorrhoids, and a 12-mm  adenomatous colon polyp from the ascending colon which was apparently  removed by piecemeal technique. A letter to return for colonoscopy was  sent in February of 2006, but the patient did not return for  colonoscopy. The patient relates episodes of mild left upper quadrant  discomfort associated with belching intermittently for the past few  months. He was diagnosed with an iron-deficiency anemia with a  hemoglobin of 9.6 and a ferritin of 5.8. His iron saturation was low at  5.9% with a normal TIBC of 390. Stool testing was negative. He was  placed on iron supplements, and he noted black stools since beginning  iron. He denies tarry, stick or frequent stools. He notes no  hematochezia, change in bowel habits, weight loss or rectal pain.   FAMILY HISTORY:  Negative for colon cancer, colon polyps and  inflammatory bowel disease.   PAST MEDICAL HISTORY:  1. Hypertension.  2. Hypothyroidism.  3. Nephrolithiasis.  4. Hyperlipidemia.  5. Coronary artery disease.  6. Degenerative joint disease.  7. Adenomatous colon polyps.  8. Diverticulosis.  9. External hemorrhoids.  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  11.COPD.  12.Asthmatic bronchitis.   PAST SURGICAL HISTORY:  1. Appendectomy 1968.  2. CABG x3 1995.  3. Right knee  replacement 2007.   CURRENT MEDICATIONS:  Listed on the chart, updated and reviewed.   MEDICATION ALLERGIES:  SEPTRA.   SOCIAL HISTORY AND REVIEW OF SYSTEMS:  Pertinents in hand-written form.   PHYSICAL EXAMINATION:  Well-developed, overweight, white male in no  acute distress. Height 5 feet 10 inches, weight 210 pounds, blood  pressure 136/68, pulse 64 and regular.  HEENT:  Anicteric sclerae. Oropharynx clear.  NECK:  Without thyromegaly or adenopathy noted.  CHEST:  Clear to auscultation bilaterally.  CARDIAC:  Regular rate and rhythm without murmurs noted.  ABDOMEN:  Soft, nontender, nondistended. Normal active bowel sounds. No  palpable organomegaly, masses or hernias.  RECTAL EXAMINATION:  Deferred to colonoscopy. Recent Hemosure testing  was negative.  EXTREMITIES:  Without clubbing, cyanosis, or edema.  NEUROLOGICAL:  Alert and oriented x4. Grossly nonfocal.   ASSESSMENT AND PLAN:  Refractory iron-deficiency anemia with Hemosure  negative stool and a personal history of a 12-mm adenomatous polyp  removed from his ascending colon. Rule out celiac disease leading to  poor iron and calcium absorption. Rule out occult sources of  gastrointestinal blood loss such as colorectal neoplasms, ulcer disease,  AVMs, and other disorders. Risks, benefits and alternatives of  colonoscopy with possible biopsy and possible polypectomy and upper  endoscopy with possible biopsy discussed with the patient, and he  consents to proceed.  Send a tissue transglutaminase.     Venita Lick. Russella Dar, MD, [facility]  Electronically Signed    MTS/MedQ  DD: 04/22/2008  DT: 04/22/2008  Job #: [id number]   cc:   Gwen Pounds, MD

## 2011-05-09 NOTE — Assessment & Plan Note (Signed)
[facility]   NAME:  ROXAS, CLYMER NO.:  [id number]   MEDICAL RECORD NO.:  [id number]      DATE OF BIRTH:  02-06-1927   PHYSICIAN:  Maxwell Caul, M.D. VISIT DATE:  01/11/2009                                   OFFICE VISIT   [name] is a gentleman who we have been following for a refractory  area of wound on his right elbow secondary to chronic olecranon bursitis  of uncertain etiology.  Three weeks ago, I used a remnant of an Apligraf  here.  He is returning today in followup.   He has been applying collagen and hydrogel to the wound area.  He does  not report any pain.   On examination, the area in his right elbow is now measuring 1.5 x 0.3 x  0.2.  There is at least 0.5 cm of undermining from 7 to 11 o'clock.  There is surrounding inflammation in the elbow, which is a chronic  finding for him.   IMPRESSION:  Chronic wound in the setting of chronic olecranon bursitis.  I have put him back on Regranex to the undermining area.  He is to  continue with a collagen and hydrogel-based dressing over the actual  wound surface itself.  I do not see a lot of improvement in this wound,  probably related to chronic ongoing inflammation.  We will see him back  in 2 weeks' time.  At that point, we will need to make a decision about  whether we continue with Regranex by writing him his prescription.           ______________________________  Maxwell Caul, M.D.     MGR/MEDQ  D:  01/11/2009  T:  01/12/2009  Job:  [id number]

## 2011-05-09 NOTE — Assessment & Plan Note (Signed)
[facility]   NAME:  SRIANSH, FARRA NO.:  [id number]   MEDICAL RECORD NO.:  [id number]      DATE OF BIRTH:  [birth year]   PHYSICIAN:  Maxwell Caul, M.D. VISIT DATE:  10/01/2008                                   OFFICE VISIT   [name] is a gentleman we have been following for a difficult wound on  his right elbow, resulting, I think, from chronic olecranon bursitis.  I  had been making good progress with Oasis over the last 2 months;  however, I thought last week we were in a stalled phase and made a  decision to try to move towards Regranex.  The major part of the wound  that remains is deep slit-like area that probes down to the olecranon.  I did apply Oasis last week.  He comes telling me that he actually had a  fall before his last visit, he forgot to tell me about this.  He did  land on the elbow; however, there was no pain or obvious consequences of  this.   On examination, he is afebrile.   The wound did not required debridement today.  In fact, the slit-like  area actually looks as though it is starting to fill in.  I did not  probe to bone.  I carefully looked as much as I could into the base of  the wound, and it appears that there was progressing granulation.   A chronic wound related to chronic olecranon bursitis.  I have basically  carefully reapplied the Oasis to all open areas, especially the slit-  like area, in which I have actually used Oasis as a packing.  We have  reapplied hydrogel, Mepitel, and a Kerlix wrap.  I am cautiously  optimistic that today this looked improved enough that I did not proceed  to Regranex.           ______________________________  Maxwell Caul, M.D.     MGR/MEDQ  D:  10/01/2008  T:  10/01/2008  Job:  [id number]

## 2011-05-09 NOTE — Assessment & Plan Note (Signed)
[facility]   NAME:  Kyle Brewer, Kyle Brewer               ACCOUNT NO.:  [id number]   MEDICAL RECORD NO.:  [id number]      DATE OF BIRTH:  08-Nov-1927   PHYSICIAN:  Theresia Majors. Tanda Rockers, M.D. VISIT DATE:  02/17/2008                                   OFFICE VISIT   SUBJECTIVE:  Kyle Brewer is a 77 year old man with ulcerative bursitis  involving his right elbow.  We have treated him in the interim with a  matrix dressing applied every 3 days.  There has been no interim fever.  There has been scant drainage.  She continues to have a normal range of  motion.   OBJECTIVE:  VITALS:  Blood pressure is 165/88, respirations 18, pulse  rate 64, temperature is 98.  Inspection of the right elbow shows that  the wound continues to shrink. There are areas of necrosed capsule and  cartilage which under topical anesthetics underwent an excisional  debridement with minimum hemorrhage effectively controlled with direct  pressure.  The patient retains a normal range of motion.   ASSESSMENT:  Clinical improvement.   PLAN:  We will continue to use a matrix dressing.  We have substituted  the trade Promogran for puracol. The patient will change this dressing  every 3 days.  We will reevaluate him in 2 weeks.      [name], M.D.  Electronically Signed     HAN/MEDQ  D:  02/17/2008  T:  02/17/2008  Job:  [id number]

## 2011-05-09 NOTE — Assessment & Plan Note (Signed)
[facility]   NAME:  Kyle Brewer, Kyle Brewer               ACCOUNT NO.:  [id number]   MEDICAL RECORD NO.:  [id number]      DATE OF BIRTH:  Jul 15, 1927   PHYSICIAN:  Theresia Majors. Tanda Rockers, M.D. VISIT DATE:  01/13/2008                                   OFFICE VISIT   SUBJECTIVE:  Kyle Brewer is a 77 year old man who we have followed for an  infected bursa.  In the interim we have treated him with an Aquacel  silver dressing.  There has been no interim drainage, malodor or fever.  He continues to have a normal range of motion in the elbow.   OBJECTIVE:  Blood pressure is 134/60, respirations 16, pulse rate 66,  temperature 98.1.  Inspection of the left elbow shows that there  continues to be advancing granulation with no evidence of active  infection.  The wound was recultured.  There continues to be a full  range of motion.   ASSESSMENT:  Clinical improvement.   PLAN:  We have recommended that the patient undergo a bipedicle flap  advancement to cover the wound.  We will proceed with this during his  next visit.  We have explained the technique as well as the potential  for an increase in the total area of ulceration if there is concurrent  peri flap repair injury.  We have given the patient an opportunity to  ask questions.  He appreciates the seriousness of the recommendation and  indicates his willingness to proceed as outlined.      [name], M.D.  Electronically Signed     HAN/MEDQ  D:  01/13/2008  T:  01/13/2008  Job:  [id number]

## 2011-05-09 NOTE — Assessment & Plan Note (Signed)
[facility]   NAME:  Kyle, Brewer NO.:  [id number]   MEDICAL RECORD NO.:  [id number]      DATE OF BIRTH:  Nov 14, 1927   PHYSICIAN:  Leonie Man, M.D.         VISIT DATE:                                   OFFICE VISIT   [name] is a 77 year old gentleman with long-standing right-sided  elbow bursitis and associated ulceration which has been recalcitrant to  treatment.  He has had drainage from this area for some years now.  He  has been treated over the past several months with SilvaSorb gel and a  bulky dressing.  The wound is healing and seems to be granulating well  at this point.   PHYSICAL EXAMINATION:  The patient is alert and oriented in no distress.  He is afebrile with temperature of 98.1, pulse 61, respirations 18,  blood pressure 129/73.  The wound shows a pink granulation.  There is  moderate drainage.  No necrosis.   Treatment Plan/ Promogran, bulky dressing, and an elbow pad for relief  of pressure against his elbow.   DISP/ We will follow up with him again in 2 weeks.      Leonie Man, M.D.  Electronically Signed     PB/MEDQ  D:  03/29/2009  T:  03/29/2009  Job:  [id number]

## 2011-05-09 NOTE — Assessment & Plan Note (Signed)
[facility]   NAME:  BRODE, SCULLEY NO.:  [id number]   MEDICAL RECORD NO.:  [id number]      DATE OF BIRTH:  09-26-1927   PHYSICIAN:  Joanne Gavel, M.D.         VISIT DATE:  04/12/2009                                   OFFICE VISIT   HISTORY OF PRESENT ILLNESS:  This is a 77 year old male with a  longstanding elbow bursitis, which had been drained or excised and had a  large wound, which was draining for several years.  He has been treated  recently with SilvaSorb and bulky dressing and the wound is healing.   PHYSICAL EXAMINATION:  The wound shows good granulation tissue.  It is  essentially slit, measuring 1 mm x 0.1 x 0.3, it is essentially  painless.   PLAN:  Continue Promogran and bulky dressing, add an elbow pad for  relief of pressure.   FOLLOWUP:  To Dr. Talmage Nap in 2 weeks.      Joanne Gavel, M.D.  Electronically Signed     RA/MEDQ  D:  04/12/2009  T:  04/12/2009  Job:  [id number]

## 2011-05-09 NOTE — Assessment & Plan Note (Signed)
[facility]   NAME:  Kyle Brewer, Kyle Brewer               ACCOUNT NO.:  [id number]   MEDICAL RECORD NO.:  [id number]      DATE OF BIRTH:  Dec 15, 1927   PHYSICIAN:  Theresia Majors. Tanda Rockers, M.D. VISIT DATE:  01/28/2008                                   OFFICE VISIT   SUBJECTIVE:  Kyle Brewer is a 77 year old man whom we have followed for  ulcerative bursitis involving his left elbow.  In the interim we have  dressed him with Chromogranin and silver gel.  There has been a marked  decrease in drainage.  He continues to have a normal range of motion.  There has been no fever and no pain.   OBJECTIVE:  Blood pressure is 105/50, respirations 18, pulse rate 69,  temperature is 98.1.  Inspection of the left elbow shows that there is healthy clean  granulation with a well adherence of the wound edges.  There is no  undermining.  There is no drainage.  There is no malodor and there is no  obvious need for debridement.  The cartilaginous/fibrous components of  the elbow are visible and appear to be healthy and with good moisture.  There is minimum periwound inflammation.   ASSESSMENT:  Satisfactory progress.   PLAN:  We will no longer considers the patient a candidate for a  Bipedicle flap advancement. We will return to Asquacel with the silver  gel, 4x4, and fish net, and reevaluate him on a weekly basis.  There  appears to be adequate advancement of granulation and we will maintain a  range of motion and guard against infection.  We have explained this  approach to the patient.  We have given him an opportunity to ask  questions.  He seems to understand and indicates that he will be  compliant.      [name], M.D.  Electronically Signed     HAN/MEDQ  D:  01/28/2008  T:  01/28/2008  Job:  [id number]

## 2011-05-09 NOTE — Assessment & Plan Note (Signed)
[facility]   NAME:  GREYDON, BETKE NO.:  [id number]   MEDICAL RECORD NO.:  [id number]      DATE OF BIRTH:  1927-09-22   PHYSICIAN:  Maxwell Caul, M.D. VISIT DATE:  09/24/2008                                   OFFICE VISIT   Mr. 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  He has had  Oasis applied for the last 2 months, and we initially made rapid  progress here.  However, recently in the last 2-3 weeks, there has been  much less progress.  The major wound is a what is labeled as distal  right wound is a slit-like area that probes down to the olecranon  itself.   On examination, he is afebrile.  The wound underwent a non-excisional  debridement in its superior aspect.   IMPRESSION:  Chronic wound related to chronic olecranon bursitis.  Once  again, this underwent a non-excisional debridement.  I reapplied the  Oasis, although I think we are in a stalled mode here as the tissue  itself has chronic inflammation and is friable.  Even if we were to get  this to close over, he would need this chronically protected in some way  shape her forearm.  In any case, I have asked for prior assurance  approval of Regranex and what the patient's copay would be.  He remains  a candidate for a non-formulary use of an Apligraf, although I have not  yet seen a suitable donor for this.           ______________________________  Maxwell Caul, M.D.     MGR/MEDQ  D:  09/24/2008  T:  09/25/2008  Job:  [id number]

## 2011-05-09 NOTE — Assessment & Plan Note (Signed)
[facility]   NAME:  Kyle Brewer, Kyle Brewer NO.:  [id number]   MEDICAL RECORD NO.:  [id number]      DATE OF BIRTH:  July 19, 1927   PHYSICIAN:  Maxwell Caul, M.D.      VISIT DATE:                                   OFFICE VISIT   LOCATION:  [facility].   [name] returns today in followup for his chronic wound in his right  elbow.  The exact etiology of this appears to have been a chronic  olecranon bursitis.  I have been applying Oasis since July 22, 2008, and  there has been substantial improvement in this wound including  separation of the original wound into 2 separate islands.  He has some  discomfort.  No excessive drainage.  He was last seen and had Oasis  changed on August 10, 2008.   On examination, his vital signs are stable.  He is afebrile.  The two  areas of this are now labeled as proximal and distal.  The proximal is  0.9 x 0.6 x 0.2; however, there is considerable amount of undermining  here from roughly 9-12 o'clock.  The right distal elbow is an oval-  shaped linear slit measuring 0.5 x 0.7 x 0.2.   I debrided some thick adherent eschar from the proximal aspect of this  wound using EMLA for anesthesia, hemostasis with direct pressure.  The  wound had a cleaner granulating base after debridement.  The linear  wound that is distal was not debrided.  We applied Oasis to both areas  of this wound.  The Oasis was moistened with saline.  I applied topical  hydrogel to the proximal part of this as this wound bed always appears  dry.  Of note, Oasis needed to be packed into the undermining aspect of  this wound.   IMPRESSION:  Very difficult wound on the right elbow related to chronic  olecranon bursitis.  There has been improvement with Oasis; however, the  wound continues to be very difficult wound especially the proximal  aspect of this with the undermining.  I wondered out whether this might  be off-label  Apligraf candidate and we will look into this.  For now, I  have continued with the Oasis which I Steri-Stripped and applied  Mepitel.  He will wrap this with fish neck gauze.  We will see him in a  week.           ______________________________  Maxwell Caul, M.D.     MGR/MEDQ  D:  08/17/2008  T:  08/18/2008  Job:  [id number]

## 2011-05-09 NOTE — Assessment & Plan Note (Signed)
[facility]   NAME:  [name], [name] NO.:  [id number]   MEDICAL RECORD NO.:  [id number]      DATE OF BIRTH:  04-05-1927   PHYSICIAN:  Maxwell Caul, M.D.      VISIT DATE:                                   OFFICE VISIT   Kyle Brewer is a gentleman with a difficult wound on his right elbow  secondary to chronic olecranon bursitis and probably repetitive trauma.  He has not had specific infection in this wound, and I have see no other  particular features other than chronic inflammation.  We have been  making over the last 2 or 3 months good progress with Oasis, although  recently, I felt this is stalled out.   On examination, the wound actually is down to the single open area  measured 1.8 x 0.4 x 0.4.  This still probes down to his olecranon.  I  have switched him to Puracol, hydrogel, Kerlix, and fish net.  He has  Promogran at home from a previous attempt at this, and I think if we can  change this 2 times a week with the help of a friend of his, we can  stretch his visits out.  As mentioned, I felt today that we have gotten  as much out of the Oasis as we could have hope for.   IMPRESSION:  Wound related to chronic olecranon bursitis.  Unfortunately, his insurance will cover none of Regranex costing 650  dollars.  I have packed this with Puracol, applied hydrogel, and a  covering layer of Puracol over the open area.  We will try to teach his  significant other to do this and we will see him again in a week.           ______________________________  Maxwell Caul, M.D.     MGR/MEDQ  D:  10/29/2008  T:  10/29/2008  Job:  [id number]

## 2011-05-09 NOTE — Assessment & Plan Note (Signed)
[facility]   NAME:  Kyle Brewer, Kyle Brewer NO.:  [id number]   MEDICAL RECORD NO.:  [id number]      DATE OF BIRTH:  09/29/1927   PHYSICIAN:  Maxwell Caul, M.D. VISIT DATE:  11/16/2008                                   OFFICE VISIT   [name] is a gentleman we have been following for refractory area of  wound on his right elbow secondary to chronic olecranon bursitis of  uncertain etiology with probably some degree of repetitive trauma.  When  I initially saw this, he had a fairly large size wound over the entire  surface of the olecranon, which probed down to bone.  We are able to get  some resolution with Oasis and probably had about 60-70% overall wound  volume closure, however, this has recently stalled to a small oval-  shaped wound right over the olecranon.  We have had some improvement in  the granulation at the base of this, although this still probes the  bone.  When he was last here 2 weeks ago, we switched to Chromogranin  hydrogel.   The patient states that he is not having any particular difficulties  with the wound, although, he is having trouble with wound dressing.  His  girlfriend who is only available source of aide has trouble doing wound  dressings and he finds it difficult to do this himself specifically  packing the depth of the wound.   On examination, his wound measures 1.5 x 1.1 x 0.2.  There is still  visible bone here, although, there has been some granulation over the  surface of this.  There is no overt infection.   IMPRESSION:  Wound related to chronic olecranon bursitis.  The patient  actually has had some improvement in this, however, this is based  predominately on visual inspection rather than wound measurements.  There is no evidence of infection.  I have given him a sample of  Regranex and we have carefully taught him how to pack and dress this  with a moist gauze or hydrogel.  We will continue with Kerlix  and  fishnet.  We did have a discussion about how to cover this under home  care, although this is a daily dressing and I do not think home care  could continue to do this on a daily basis.  We will see if between the  patient and his significant other whether this can be done at home.           ______________________________  Maxwell Caul, M.D.     MGR/MEDQ  D:  11/16/2008  T:  11/16/2008  Job:  [id number]

## 2011-05-09 NOTE — Assessment & Plan Note (Signed)
[facility]   NAME:  Kyle Brewer, Kyle Brewer NO.:  [id number]   MEDICAL RECORD NO.:  [id number]      DATE OF BIRTH:  05-Jan-1927   PHYSICIAN:  Maxwell Caul, M.D.      VISIT DATE:                                   OFFICE VISIT   Kyle Brewer is a 77 year old man we have been following for a difficult  chronic wound on his right elbow in the setting of chronic olecranon  bursitis of uncertain etiology.  He had a recent MRSA infection in this  wound and a recent culture at the beginning of August grew a few coag-  negative staph.  Most recently, he has been applying Iodosorb.   On examination, there is a very tiny wound remaining here.  There is  still some drainage.  I did not reculture.   IMPRESSION:  Chronic olecranon bursitis.   We continued the Iodosorb gel with Adaptic and gauze.  We will see him  again in three weeks' time.           ______________________________  Maxwell Caul, M.D.     MGR/MEDQ  D:  08/26/2009  T:  08/27/2009  Job:  [id number]

## 2011-05-09 NOTE — Assessment & Plan Note (Signed)
[facility]   NAME:  [name], [name] NO.:  [id number]   MEDICAL RECORD NO.:  [id number]      DATE OF BIRTH:  15-Feb-1927   PHYSICIAN:  Maxwell Caul, M.D.      VISIT DATE:                                   OFFICE VISIT   [name] is a gentleman that we have been following for a nonhealing  wound on his right elbow secondary to chronic olecranon bursitis of  really uncertain etiology.  I made some progress with Oasis through the  late summer and fall of last year.  I have used off label Apligraf  applications once, this did not help.  We did give him Regranex   Dictation ended at this point.           ______________________________  Maxwell Caul, M.D.     MGR/MEDQ  D:  03/15/2009  T:  03/15/2009  Job:  [id number]

## 2011-05-09 NOTE — Assessment & Plan Note (Signed)
[facility]   NAME:  Kyle Brewer, Kyle Brewer NO.:  [id number]   MEDICAL RECORD NO.:  [id number]      DATE OF BIRTH:  1927/08/24   PHYSICIAN:  Maxwell Caul, M.D. VISIT DATE:  12/28/2008                                   OFFICE VISIT   [name] is a gentleman who we have been following for refractory area  of wound on his right elbow secondary to chronic olecranon bursitis of  uncertain etiology.  Two weeks ago, I used a remnant of an Apligraf in  here.  He is returning today now 2 weeks in followup.   On examination, the area that actually was a slit-like area, that probed  down the bone, is certainly a lot less open than it was last time.  I  believe there has been some progression of granulation here.  Unfortunately, there is also a smaller open area superior to the wound,  which is new.  I discussed this with the patient.  I am certainly not  trying to paint this as a tremendous improvement; however, the slit-like  area seems to be less depth although there is still a substantial open  area.   IMPRESSIONS:  Chronic wound in the setting of chronic olecranon  bursitis.  I have put him back on a collagen hydrogel-based dressing,  which he has at home.  I do not want to disturb the deep recess of this  wound, which I placed the Apligraf in.  For now, we will see him back in  2 weeks' time.           ______________________________  Maxwell Caul, M.D.     MGR/MEDQ  D:  12/28/2008  T:  12/28/2008  Job:  [id number]

## 2011-05-09 NOTE — Assessment & Plan Note (Signed)
[facility]   NAME:  DASHIEL, BERGQUIST NO.:  [id number]   MEDICAL RECORD NO.:  [id number]      DATE OF BIRTH:  [birth year]   PHYSICIAN:  Maxwell Caul, M.D. VISIT DATE:  07/15/2009                                   OFFICE VISIT   [name] is a 77 year old man we have been following for a very  difficult wound in the right elbow in the setting of chronic olecranon  bursitis.  The patient had a recent MRSA infection.  I have recently  switched back to Oasis, as the wound previously had had a good response  to this.   On examination, the dimensions of this are 0.9 x 0.3 x 0.5.  There is no  evidence of infection.  There is surrounding inflammation compatible  with his chronic olecranon bursitis, although I do not know exactly what  the etiology is.   IMPRESSION:  Right elbow wound in the setting of chronic olecranon  bursitis.  I have restarted Oasis, this is now the second week.  We will  see if we get another response to this as we did earlier this year.           ______________________________  Maxwell Caul, M.D.     MGR/MEDQ  D:  07/15/2009  T:  07/16/2009  Job:  [id number]

## 2011-05-09 NOTE — Assessment & Plan Note (Signed)
[facility]   NAME:  DOYLE, KUNATH               ACCOUNT NO.:  [id number]   MEDICAL RECORD NO.:  [id number]      DATE OF BIRTH:  01-26-1927   PHYSICIAN:  Theresia Majors. Tanda Rockers, M.D. VISIT DATE:  05/25/2008                                   OFFICE VISIT   SUBJECTIVE:  Mr. Heims is a 77 year old man who we have followed for  ulcerative bursitis involving the right elbow.  In the interim, we have  treated him with twice daily antibacterial soap washes and saline  rinses.  He continues to use a fishnet tube gauze and a 4x4 for  protection.  There has been no interim fever or excessive pain.   OBJECTIVE:  Blood pressure is 138/80, respirations 16, pulse rate 57,  temperature 98.4.  Inspection of the right elbow shows that the ulcer  has contracted.  There is healthy-appearing granulation with advancing  epithelium.  There is no drainage, no malodor.  The neurovascular exam  is normal.  There is no evidence of infection.   ASSESSMENT:  Clinical improvement.   PLAN:  We have encouraged the patient to continue daily antibacterial  soap washings and protection with a tube gauze and 4x4.  We will  reevaluate him in 1 month p.r.n.      [name], M.D.  Electronically Signed     HAN/MEDQ  D:  05/25/2008  T:  05/25/2008  Job:  [id number]

## 2011-05-09 NOTE — Assessment & Plan Note (Signed)
[facility]   NAME:  MARSH, HECKLER               ACCOUNT NO.:  [id number]   MEDICAL RECORD NO.:  [id number]      DATE OF BIRTH:  11-Jun-1927   PHYSICIAN:  Lenon Curt. Chilton Si, M.D.        VISIT DATE:                                   OFFICE VISIT   This is a 77 year old male with chronic nonhealing wound to the right  elbow, whose treatment initially began December 2008, who has made some  progress over the last couple of months and he has had 15 applications  of Oasis to his right elbow.  Review of previous pictures and notes  indicates that at least half of his wound at the right elbow seems to  have healed, and he has no pain in this area.  Upon flexion of his  elbow, there is a very tight skin, however, bandages have kept him from  fully flexing his elbow, so I do not think that this is compromising the  healing of the wound.  There is no evidence of infection of the wound.  Segments of the wound have healed and there continues to be a long slit-  like area, which may actually have enlarged a bit since last week.  The  Oasis, which has been applied multiple times and does seem that they  have improved the wound overall, however.   Treatment today is to reapply Oasis Hydrogel, Mepitel, bulky dressing,  cover with Kerlix and fish net.  The patient is advised to return in 1  week for further evaluation and followup.      Lenon Curt Chilton Si, M.D.  Electronically Signed     AGG/MEDQ  D:  10/08/2008  T:  10/08/2008  Job:  [id number]

## 2011-05-09 NOTE — Assessment & Plan Note (Signed)
[facility]   NAME:  Kyle Brewer, Kyle Brewer               ACCOUNT NO.:  [id number]   MEDICAL RECORD NO.:  [id number]      DATE OF BIRTH:  1927/07/08   PHYSICIAN:  Lenon Curt. Chilton Si, M.D.   VISIT DATE:  07/29/2009                                   OFFICE VISIT   HISTORY:  A 78 year old male returns for recheck of the wound to the  right elbow.  There is chronic olecranon bursitis and there is an  ulceration center to this.  Wound is erythematous, looks smaller than in  the past.  There is constant drainage from the ulceration.   WOUND TREATMENT:  On July 08, 2009, insisted on application of the  Oasis, hydrogel, Mofetil, and gauze.  Bandages have been in place now  for over 2 weeks.  They are saturated with drainage from this wound.   PHYSICAL EXAMINATION:  Temperature 98.2, pulse 63, respirations 19, and  blood pressure 133/77.   WOUND MEASUREMENTS:  Wound measurement of the right elbow is 1.0 x 0.4 x  0.3 cm depth.  The ulceration is surrounded by some significant erythema  and chronic scar tissue.  There is a new small area that is 1 inch  distal to the wounds.  It seems to be a type of ulcer or abrasion.  It  is slightly tender to the patient.  There is no odor to the wound.  There is serous fluid draining from the wound area in the course of  today's exam.   TREATMENT:  We applied Puracol Plus Ag, hydrogel, adaptic, and gauze to  the right elbow.  He is to return in 1 week.  Cultured the drainage from  the wound.   ICD-9 CODE:  707.8 chronic ulcer of the elbow.  7726.33 olecranon bursitis.   CPT CODE:  04540.      Lenon Curt Chilton Si, M.D.  Electronically Signed     AGG/MEDQ  D:  07/29/2009  T:  07/30/2009  Job:  [id number]

## 2011-05-09 NOTE — Assessment & Plan Note (Signed)
[facility]   NAME:  EDKER, PUNT               ACCOUNT NO.:  [id number]   MEDICAL RECORD NO.:  [id number]      DATE OF BIRTH:  1927-12-21   PHYSICIAN:  Theresia Majors. Tanda Rockers, M.D. VISIT DATE:  12/23/2007                                   OFFICE VISIT   SUBJECTIVE:  Mr. Barkan is being followed for an infected bursa involving  his right elbow.  During the interim there has been no excessive  drainage.  He has been dressed with a silver matrix dressing.  There has  been no fever.  He maintains a normal range of motion.   OBJECTIVE:  VITAL SIGNS:  Blood pressure is 161/79, respirations 18,  pulse rate 74, temperature 98.4.  EXTREMITIES:  Inspection of the right elbow shows that there is a  continuing advancing epithelial island from 4 to 2 o' clock in the wound  itself.  No debridement is needed.  The range of motion is normal.  There is no neurovascular compromise.   ASSESSMENT:  Slow, but definite clinical improvement.   PLAN:  We will continue silver matrix dressing, Kerlix and an ace.  We  will reevaluate the patient in one week.      [name], M.D.  Electronically Signed     HAN/MEDQ  D:  12/23/2007  T:  12/23/2007  Job:  [id number]

## 2011-05-09 NOTE — Assessment & Plan Note (Signed)
[facility]   NAME:  Kyle Brewer, Kyle Brewer NO.:  [id number]   MEDICAL RECORD NO.:  [id number]      DATE OF BIRTH:  [birth year]   PHYSICIAN:  Maxwell Caul, M.D.      VISIT DATE:                                   OFFICE VISIT   Kyle Brewer is a gentleman we have been following for chronic right elbow  wound in the setting of a chronic olecranon bursitis with MRSA  infection.  The patient was treated with rifampin and doxycycline.  Initially with this wound, we made considerable amount of progress with  Oasis.  However, the wound now is a small open area.  However, there  remains a considerable amount of undermining in this wound.   IMPRESSION:  Chronic right elbow wound in the setting of chronic  olecranon bursitis.  The orifice of this wound is considerably better,  although there is at least a centimeter of undermining.  We used plain  packing with SilvaSorb gel and a dry dressing.  He will be back next  week to have the nurses change this and to teach his family how to do  this.  We will see him again in a week.  The question came out about  retrying the Oasis, although I do not think we could do this unless we  opened the wound further and I am really not prepared to do this.  Closing down the skin with leaving virtual cavity in this wound would be  less than ideal in most circumstances.  However, in this circumstance,  this might not be a totally poor outcome.           ______________________________  Maxwell Caul, M.D.     MGR/MEDQ  D:  06/24/2009  T:  06/25/2009  Job:  [id number]

## 2011-05-09 NOTE — Assessment & Plan Note (Signed)
[facility]   NAME:  Kyle Brewer, Kyle Brewer               ACCOUNT NO.:  [id number]   MEDICAL RECORD NO.:  [id number]      DATE OF BIRTH:  Dec 01, 1927   PHYSICIAN:  Theresia Majors. Tanda Rockers, M.D. VISIT DATE:  03/30/2008                                   OFFICE VISIT   SUBJECTIVE:  Kyle Brewer is a 77 year old man who we have followed for  several weeks for ulcerative bursitis involving his right elbow.  In the  interim, he continues to have full and normal range of motion of the  elbow.  There has been no significant pain or drainage.  He continues to  use a moist, moist dressing with daily antiseptic soap washes.  There  has been no malodor.  There is some serous drainage, but there is been  no purulence.   OBJECTIVE:  VITAL SIGNS:  Blood pressure 124/75, respirations 18, pulse  rate 69, temperature 97.7.  RIGHT ELBOW:  Inspection of the right elbow shows that there is minimum  edema.  The wound edges are contracting.  There remains some soft  exudate, but there is absolutely no malodor and no exposed tendon at  this point.  The perfusion to the right upper extremity is normal.  There is no evidence of axillary or epitrochlear adenopathy.   ASSESSMENT:  Clinical response to local treatment and continued passive  range of motion.   PLAN:  We will continue the moist, moist dressing with antiseptic soap  washes, 4x4 and a fish net.  We will reevaluate the patient in 2 weeks.      [name], M.D.  Electronically Signed     HAN/MEDQ  D:  03/30/2008  T:  03/30/2008  Job:  [id number]

## 2011-05-09 NOTE — Assessment & Plan Note (Signed)
[facility]   NAME:  Kyle Brewer, GROENEVELD NO.:  [id number]   MEDICAL RECORD NO.:  [id number]      DATE OF BIRTH:  07/08/1927   PHYSICIAN:  Maxwell Caul, M.D. VISIT DATE:  12/14/2008                                   OFFICE VISIT   Mr. Marsalis is a gentleman who we have been following for a refractory  area of wound on his right elbow secondary to chronic olecranon bursitis  of uncertain etiology.  We actually made a fair amount of progression on  this with Oasis; however, once again, we reached a stalled phase.  We  also temporarily have tried Regranex.  This does not seem to have been  helpful.  Most recently, he has been using Regranex, hydrogel, Kerlix,  and fish net.   On examination, his wound area measures 1.4 x 0.8 x 0.2 and there is  undermining of 0.7 cm from 6-11 o'clock which probes down to his  olecranon.  There is surrounding granulation here; however, I have never  been able to get this to progress up towards a healing platform.   On examination, temperature is 97.  The wound's description is as above.  We made a decision to use some leftover Apligraf.  This is strictly off  label; however, I have extensively discussed this with the patient.  The  remnant of Apligraf was kindly donated by an earlier patient today.  I  wrapped the Apligraf on some pickups and buried it deep within the  wound.  We then used Steri-Strips, Mepitel, and fish net to hold this in  place.  He will need a nurse visit next week to change down to the  Mepitel and then we will see the wound again in 2 weeks' time.  Although, this is an off-label application, I think it is justified  based on the need to stimulate granulation tissue.   IMPRESSION:  Chronic wound, nonhealing.   PLAN:  As above.           ______________________________  Maxwell Caul, M.D.     MGR/MEDQ  D:  12/14/2008  T:  12/14/2008  Job:  [id number]

## 2011-05-09 NOTE — Assessment & Plan Note (Signed)
[facility]   NAME:  BRAYDIN, ALOI NO.:  [id number]   MEDICAL RECORD NO.:  [id number]      DATE OF BIRTH:  January 25, 1927   PHYSICIAN:  Maxwell Caul, M.D. VISIT DATE:  02/15/2009                                   OFFICE VISIT   Mr. Weier has a difficult wound on his right elbow secondary to chronic  olecranon bursitis of really uncertain etiology.  I had made some  progress with Oasis through the late summer and fall of last year.  I  have been using Regranex, Promogran and hydrogel to the more superficial  part of this wound.  His friend is actually changing the wound dressing.   On examination, the wound really looks much the same as usual.  Measurements are 1.2 x 1.5 x 0.3.  There is a 0.9 cm of undermining from  7 to 9 o'clock overall a big improvement from what this was but it is  really stalled.  The patient tells me that there is a bit of an odor to  the wound recently.  I did a culture from the deeper recesses of this.   IMPRESSION:  Chronic right elbow wound related to olecranon bursitis.  I  have cultured this wound.  I have changed his dressings from a collagen  based dressing to SilvaSorb gel with a nonadherent covering under a  Kerlix wrap.  I will give this probably 2-3 weeks to see if it has any  effect and then a more protracted course of Regranex would be the only  other suggestion I would have.  This stalled on Oasis and did not  respond to a off label Apligraf application  I put on.  I do not think  this would be an area that is amendable to any form of surgical  intervention.           ______________________________  Maxwell Caul, M.D.     MGR/MEDQ  D:  02/15/2009  T:  02/15/2009  Job:  [id number]

## 2011-05-09 NOTE — Assessment & Plan Note (Signed)
[facility]   NAME:  PAPE, PARSON NO.:  [id number]   MEDICAL RECORD NO.:  [id number]      DATE OF BIRTH:  December 18, 1927   PHYSICIAN:  Maxwell Caul, M.D. VISIT DATE:  07/08/2009                                   OFFICE VISIT   Kyle Brewer is a 77 year old man we have been following for a very  difficult area in his right elbow in the setting of chronic olecranon  bursitis.  The patient has had a recent MRSA infection.  Initially when  I started looking at this wound at the beginning part of this year,  we  made considerable amount of progress with Oasis however, the wound never  really cleared.  We have been using most recently a plain packing with  SilvaSorb gel.   On examination of the right elbow, the wound continues to be of sizable  depth measuring 0.9 x 0.6 x 0.5.  There was no overt infection here  nothing that looked like that needed culturing.   IMPRESSION:  Right elbow wound in the setting of chronic olecranon  bursitis.  For want of something else to try here I have gone ahead and  restarted the Oasis.  Previously, his wound had a nice response in terms  of filling up large areas of this wound and I am going to try to  replicate this at least to get some reduction of the depth.           ______________________________  Maxwell Caul, M.D.     MGR/MEDQ  D:  07/08/2009  T:  07/09/2009  Job:  [id number]

## 2011-05-09 NOTE — Assessment & Plan Note (Signed)
[facility]   NAME:  ZAYNE, DRAHEIM NO.:  [id number]   MEDICAL RECORD NO.:  [id number]      DATE OF BIRTH:  03/11/1927   PHYSICIAN:  Maxwell Caul, M.D. VISIT DATE:  02/01/2009                                   OFFICE VISIT   HISTORY:  Mr. Bourdon is a gentleman we have been following for a  difficult area of ulceration on his right elbow secondary to chronic  olecranon bursitis of uncertain etiology.  I had been using Regranex to  the undermining area and Promogran and hydrogel to the more superficial  area of his wound.  His friend is changing this every other day and  wrapping with Kerlix and fish net.   PHYSICAL EXAMINATION:  Temperature is 97.8.  We had previously defined  two areas here, however, the area is down now to one wound.  The area  that we had previously labeled as 4A has resolved.  The area of major  ulceration is measuring 1.2 x 1.3 x 0.2, this is somewhat better.  The  area of undermining that was at 1-3 o'clock also is somewhat less  impressive to me.  I cannot easily probe to bone and I cannot easily  even see bone underneath this.  This suggests that the Regranex may be  playing a role.  Nevertheless, when I discussed this in detail with Mr.  Jaquith, the [phone] cost he felt was prohibitive and we are going to simply  try to just dress this once again with Promogran and hydrogel.  I have  used Oasis extensively on this with some effect, however, this stalled  eventually.  I did use an off label Apligraf application at one point  which did not seem to help.   IMPRESSION:  Chronic wound related to chronic olecranon bursitis.  We  will continue with Promogran and hydrogel.  If this stalls again or  regresses, I would consider retrying the Regranex, although the patient  would have to deal with a prohibitive cost that would probably not be  covered by his insurance.           ______________________________  Maxwell Caul, M.D.     MGR/MEDQ  D:  02/01/2009  T:  02/02/2009  Job:  [id number]

## 2011-05-09 NOTE — Assessment & Plan Note (Signed)
[facility]   NAME:  Kyle Brewer, Kyle Brewer               ACCOUNT NO.:  [id number]   MEDICAL RECORD NO.:  [id number]      DATE OF BIRTH:  18-Mar-1927   PHYSICIAN:  Theresia Majors. Tanda Rockers, M.D. VISIT DATE:  03/16/2008                                   OFFICE VISIT   SUBJECTIVE:  Kyle Brewer is a 77 year old male who we are following for  ulcerative bursitis involving his left elbow.  He continues to use  Promogran biweekly with showers.  There has been no excessive drainage,  malodor, pain or fever.  He continues to have a normal range of motion.   OBJECTIVE:  Blood pressure 139/90, respirations 16, pulse rate 63,  temperature 98.1.  Inspection of the right elbow shows that there is  healthy-appearing granulation.  There are areas of callous and  inspissated Promogran.  There is no need for debridement.  There is no  excessive drainage.  There is no significant undermining.  There is a  full range of motion with a readily palpable radius and brachial artery  pulsation.  There is no evidence of ascending infection.   ASSESSMENT:  Definite clinical improvement.   PLAN:  We will discontinue the Promogran following the use of his  current supplies.  Thereafter, the patient will institute daily  antiseptic soap washing and thorough rinsing, preferably with a shower.  We will reevaluate him in 2 weeks p.r.n.      [name], M.D.  Electronically Signed     HAN/MEDQ  D:  03/16/2008  T:  03/16/2008  Job:  [id number]

## 2011-05-09 NOTE — Assessment & Plan Note (Signed)
[facility]   NAME:  ZACHARIA, SOWLES               ACCOUNT NO.:  [id number]   MEDICAL RECORD NO.:  [id number]      DATE OF BIRTH:  December 01, 1927   PHYSICIAN:  Theresia Majors. Tanda Rockers, M.D. VISIT DATE:  01/20/2008                                   OFFICE VISIT   SUBJECTIVE:  Mr. Dudding is a 77 year old man who we are following for  ulcerative bursitis in the interim. We have treated him with an Aquacel  silver hydrogel 4x4 fish net. He continues to be active.  There has been  moderate drainage with a mild malodor but no fever.   OBJECTIVE:  VITALS:  Blood pressure is 137/72, respirations 18, pulse  rate 65, temperature 97.  EXTREMITIES:  Inspection of the left elbow shows that the wound is open.  There is no evidence of excessive hyperemia.  There is no abscess.  The  wound was probed and is mildly tender at 11 o'clock.  There is no  excessive recess or undermining.  There appears to be an increased  amount of granulation.   ASSESSMENT:  Clinical improvement.   PLAN:  We resume local wound care with the elimination of the Aquacel  silver. We will replace that with Chromogranin plus 4x4 silver gel and  fish net.  We will also place him in an Ace wrap.  We will reevaluate  him in 1 week.  We will reconsider advancing the bipedicle flap at that  time.      [name], M.D.  Electronically Signed     HAN/MEDQ  D:  01/20/2008  T:  01/21/2008  Job:  [id number]

## 2011-05-09 NOTE — Assessment & Plan Note (Signed)
[facility]   NAME:  Kyle Brewer, Kyle Brewer NO.:  [id number]   MEDICAL RECORD NO.:  [id number]      DATE OF BIRTH:  Apr 18, 1927   PHYSICIAN:  Ardath Sax, M.D.           VISIT DATE:                                   OFFICE VISIT   He comes back for follow up.  He has had an infected bursitis in his  right elbow and at the present time, it has healed over.  There is one  middle pinhole which he says some clear fluid comes through.  I just put  a bulky dressing on with Kerlix, and he takes care of it at home.  He  says he would like to come back and see Dr. Leanord Hawking which I told him is  fine.  So we made him an appointment for 2 weeks.      Ardath Sax, M.D.     PP/MEDQ  D:  08/05/2009  T:  08/06/2009  Job:  [id number]

## 2011-05-09 NOTE — Assessment & Plan Note (Signed)
[facility]   NAME:  Kyle Brewer, Kyle Brewer NO.:  [id number]   MEDICAL RECORD NO.:  [id number]      DATE OF BIRTH:  Aug 22, 1927   PHYSICIAN:  Maxwell Caul, M.D. VISIT DATE:  08/12/2009                                   OFFICE VISIT   HISTORY:  [name] is a 77 year old man we have been following for a  difficult area on the right elbow in the setting of chronic olecranon  bursitis.  The patient had a recent MRSA infection.  Most recently, he  had a Puracol AG, hydrogel, Adaptic, and a gauze wrap placed to his  elbow.  A recent culture grew few coag-negative staph which was not felt  to be a true infection.  Last week, he simply had this covered with  Adaptic, although I think he has been using zinc oxide at home.   PHYSICAL EXAMINATION:  On examination of right elbow, there is a very  tiny open area remaining in this wound.  There is a deep recess in this,  however, the opening to this is very small as well.  There is some  serous drainage.   IMPRESSION:  Chronic olecranon bursitis.  The area is moist with some  drainage but no infection.  I actually wanted to try Iodosorb gel with  Adaptic and gauze.  We will see if we can get this to close over.  There  may be a cavity in this even if the skin closes over, however, I think  this would be a good outcome.           ______________________________  Maxwell Caul, M.D.     MGR/MEDQ  D:  08/12/2009  T:  08/13/2009  Job:  [id number]

## 2011-05-09 NOTE — Consult Note (Signed)
Kyle Brewer, [name]               ACCOUNT NO.:  [id number]   MEDICAL RECORD NO.:  [id number]          PATIENT TYPE:  REC   LOCATION:  FOOT                         FACILITY:  [facility]   PHYSICIAN:  Jonelle Sports. Sevier, M.D. DATE OF BIRTH:  12-08-1927   DATE OF CONSULTATION:  12/02/2007  DATE OF DISCHARGE:                                 CONSULTATION   HISTORY:  This 77 year old white male is seen at the courtesy of Dr.  Timothy Lasso for consideration of a chronic wound to the right elbow.   The patient is unable to date exactly the beginning of this wound,  although he said that during his years of employment, he had frequent  repeated trauma to that area.  He does not remember any episodes of  acute bursitis, but did have sack-like swelling of that elbow from time  to time.  He thinks approximately a year ago that he first developed an  open wound in the area.  He had been seen since then by several  physicians to include Dr. Marlowe Kays.  Dr. Simonne Come thought that  probably this wound resulted from a chronic bursitis and that he also  raised the question of gout in that there were several nodular areas  around the wound that looked like they might represent tophi.  The wound  was biopsied and found simply to show chronic inflammation with no  definite evidence of gout.  Apparently, Dr. Timothy Lasso has also screened him  for gout at least in terms of hyperuricemia and has found none.  It is  noted that the patient has had nephrolithiasis, but he has been told his  stones have all been calcium stones.  He is not on any medication to  reduce uric acid.  He has had no inflammatory joint symptoms indicative  of gout and has no other skin nodules on any other area of his body  suggestive of toe fascias accumulations elsewhere.   Recently, he has been treating this wound simply with an application of  Neosporin and trying to keep a bandage over it and then also wearing a  heel pad to keep pressure  off of it as well.   He reports no fever, chills or systemic symptoms.  He is unaware of any  significant drainage.  There is no odor.  There is some discomfort  involved when he touches around the wound in certain areas where he  obviously triggers a nerve like pain.   It bears mention that the wound was x-rayed by Dr. Simonne Come at the time  of his consultation and show no evidence for osteomyelitis or other deep  infection.  This x-ray was done on May 29, 2007.   PAST MEDICAL HISTORY:  The patient's medical history is noteworthy for  several conditions, but nothing that would appear to be directly  contributory.  He does have coronary artery disease and has undergone  bypass surgery in 1994.  He has had hip fracture in May 2007, and had a  knee replacement surgery done in April 2007.  He also has had a past  appendectomy.  REGULAR MEDICATIONS:  Toprol XL, Norvasc, Lorazepam, Synthroid,  lisinopril, Fosamax, Mobic, Avodart, Hytrin and some medication  beginning with a Z which he is taking 20 mg daily.  He also takes a  multiple vitamin daily and a baby aspirin.   ALLERGIES:  He has no known medicinal allergies.   FAMILY HISTORY:  Notable for some joint problems and congestive heart  failure but no known gout.  There is no diabetes anywhere in the family.   REVIEW OF SYSTEMS:  The patient does have hypertension which apparently  has been reasonably well-controlled.  He has some trouble with dyspnea,  the basis of which is unclear.  He has compensated primary  hypothyroidism.  He has had fractures and bone interventions as  previously indicated.  He has a history of nephrolithiasis, but again  none of the stones were found to be anything other than calcium in  nature.   PHYSICAL EXAMINATION:  Examination today is limited.  VITAL SIGNS:  Show blood pressure 163/84, pulse 67 and regular,  respirations 20, temperature 98.2.   Quick survey is done looking for tophaceous changes in  the hands or at  the elbows or pretibial areas.  None are found.  Pulses in his upper  extremities are palpable and quite adequate, particularly in the right  upper extremity which is the area of concern.  There is no definite  adenopathy at the epitrochlear area or in the right axilla.   On the posterior aspect of the left elbow overlying the olecranon bursa  area is an open wound measuring 0.9 x 1.0 cm and approximately 3 cm in  depth.  The base of this wound is non granular white fibrous in  appearance.  There is no active drainage.  In the base of the wound is  one central area of slightly discolored fibrous tissue, the basis of  which is uncertain, but is clearly not necrotic.  Probing the wound  shows that there is no significant undermining in any direction.  There  is some chronic inflammation around the wound, but no warmth and no  other evidence to suggest active infection at the moment.   There are several palpable subcutaneous nodularities in the scarring  area, the wound particular just distal down the forearm from the primary  wound itself.  At one point overlying the posterior ulna is a softer  subcutaneous mass that appears completely innocent.   IMPRESSION:  Chronic bursitis, right elbow, now inactive with persistent  open wound (it is my assessment that this wound is stuck, if you will,  to the extensor tendon which the bursa lay, and has sealed off so that  the main body of the bursa is not directly involved.  Quite possibly,  the nodular areas that we are feeling just distal to the wound down onto  the upper forearm are areas of scar furcation in other points within  this bursa.   This clearly appears to be a chronic wound with no granular base and  therefore no likely opportunity for skin edge advance.   DISPOSITION:  1. The area of brownish tissue from the base of the wound is sharply      selectively debrided without incident and without any significant       bleeding.  This is sent for biopsy although such is not expected to      be in anyway surprising.  2. The wound is treated with an application of Oasis with appropriate  overlying nonstick dressings, and with hydrogel to keep the wound      place moist.  The wound is then covered with a nonstick pad held in      place with paper tape over Steri-Strips and the elbow placed in a      bulky dressing.  The patient is instructed not to change the dressing until his next  appearance here, and to keep it dry at the times of bathing through the  use of plastic bag or whatever other method he finds most satisfactory.    The plan will be hopefully through the use Oasis or whatever to get a  more healthy wound base that has a transfer granulation, at which point  the wound margins themselves will need to be addressed, possibly  saucerized in hopes of stimulating epithelialization and then having the  wound more nearly like an acute wound.  We will attempt to heal it in  ordinary fashion.  At some point, if we can get the wound preparation  adequate, he might be a candidate for a  Regranex and/or an Apligraf application.  He understands that any  progress in this will be extremely slow.  He promises cooperation with  the recommended treatments.   Follow-up visit will be here in one week.           ______________________________  Jonelle Sports. Cheryll Cockayne, M.D.     RES/MEDQ  D:  12/02/2007  T:  12/02/2007  Job:  [id number]   cc:   Gwen Pounds, MD

## 2011-05-09 NOTE — Assessment & Plan Note (Signed)
[facility]   NAME:  Kyle Brewer, Kyle Brewer               ACCOUNT NO.:  [id number]   MEDICAL RECORD NO.:  [id number]      DATE OF BIRTH:  19-Nov-1927   PHYSICIAN:  Leonie Man, M.D.    VISIT DATE:  05/17/2009                                   OFFICE VISIT   PROBLEM:  Chronic draining right elbow wound.  This is apparently an old  bursitis with chronic nonhealing area.  The patient has in the past been  cultured for MRSA and has been on rifampin and tetracycline regimen.  The wound has been treated with silver alginate (SilvaSorb with Adaptic  Kerlix) and an elbow pad in the most recent past.   He is feeling generally well.  He is not complaining of any significant  pain or discomfort.   On exam, his temperature is 98.4, pulse 70, respirations 18, blood  pressure 130/74.  The wound dimensions are 1.2 x 0.5 x 0.3.  There is a  minimal surrounding erythema.  There is clear drainage from the wound.  On probing, the wound goes down to approximately 0.3 cm extending  posteriorly.  This is packed with silver alginate dressing covered with  Adaptic and an Allevyn pad, which the patient will change every other  day over the next 2 weeks when he returns for his next visit.      Leonie Man, M.D.  Electronically Signed     PB/MEDQ  D:  05/17/2009  T:  05/18/2009  Job:  [id number]

## 2011-05-09 NOTE — Assessment & Plan Note (Signed)
[facility]   NAME:  Kyle Brewer, Kyle Brewer NO.:  [id number]   MEDICAL RECORD NO.:  [id number]      DATE OF BIRTH:  Nov 25, 1927   PHYSICIAN:  Maxwell Caul, M.D. VISIT DATE:  09/03/2008                                   OFFICE VISIT   Kyle Brewer is followed today for a chronic wound on his right elbow felt  to be secondary to some form of chronic olecranon bursitis.  He has had  swelling and inflammation over this area for many years.  Initially,  this was a single wound, however, Oasis seemed to result in some healing  and now we are looking at 3 separate open areas.  When I saw this last  week it, it did appear that there had been some deterioration with some  increasing discomfort and drainage.  I gave him a course of doxycycline.  The culture grew few group B strep.  X-ray of the elbow was negative for  osteomyelitis.  I had started him on doxycycline.  Today, his  temperature is 93.  There are 3 areas we are following over the  olecranon of the elbow.  All of these underwent nonexcisional  debridements to remove some eschar from the wounds.  Afterwards, we  applied Oasis.  I think there has been some and continued improvement  with the Oasis.  Afterwards, we wrapped this in Kerlix and an Ace  bandage.  I would like to apply an Apligraf to this area which I may try  to do with some later date and I think this would be strictly off label  use.  Also of note, the healing here is the same thin friable skin that  is present over the entire area.  I think this is going to be an ongoing  battle and certainly if we are successful in getting this healed, this  area will need to be protected on an ongoing basis.  We will see him  again in a week.           ______________________________  Maxwell Caul, M.D.     MGR/MEDQ  D:  09/03/2008  T:  09/04/2008  Job:  [id number]

## 2011-05-09 NOTE — Assessment & Plan Note (Signed)
[facility]   NAME:  Kyle Brewer, Kyle Brewer               ACCOUNT NO.:  [id number]   MEDICAL RECORD NO.:  [id number]      DATE OF BIRTH:  1927/07/21   PHYSICIAN:  Theresia Majors. Tanda Rockers, M.D. VISIT DATE:  06/23/2008                                   OFFICE VISIT   SUBJECTIVE:  Kyle Brewer is a 77 year old man who we had followed for  ulcerated bursitis involving his right elbow.  In the interim, he has  continued to use the daily antibacterial soap wash with a fishnet and a  4 x 4.  There has been no pain.  He continues to have normal range of  motion.  There has been scant drainage only.   OBJECTIVE:  VITAL SIGNS:  Blood pressure is 128/71, respirations 18,  pulse rate 56, and temperature 98.3  EXTREMITIES:  Inspection of the right elbow shows that the wound is  indeed contracted.  There is no induration.  There is no evidence of  infection.  NEUROVASCULAR :  Completely normal.  The range of motion is normal.   ASSESSMENT:  Continue clinical improvement of ulcerated bursitis.  No  active infection.   PLAN:  We will continue local antibacterial cleansing and dry dressing.  We will re-evaluate the patient in 1 month p.r.n.      [name], M.D.  Electronically Signed     HAN/MEDQ  D:  06/23/2008  T:  06/23/2008  Job:  [id number]

## 2011-05-12 NOTE — Op Note (Signed)
[name], ZARCONE NO.:  [id number]   MEDICAL RECORD NO.:  [id number]          PATIENT TYPE:  INP   LOCATION:  1422                         FACILITY:  [facility]   PHYSICIAN:  Ollen Gross, M.D.    DATE OF BIRTH:  1927/02/13   DATE OF PROCEDURE:  04/09/2006  DATE OF DISCHARGE:                                 OPERATIVE REPORT   PREOPERATIVE DIAGNOSIS:  Right intertrochanteric/subtrochanteric femur  fracture.   POSTOPERATIVE DIAGNOSIS:  Right intertrochanteric/subtrochanteric femur  fracture.   PROCEDURE:  Intramedullary nailing right femur.   SURGEON:  Ollen Gross, M.D.   ASSISTANT:  Alexzandrew L. Julien Girt, P.A.   ANESTHESIA:  General.   ESTIMATED BLOOD LOSS:  Minimal.   COMPLICATIONS:  None.   CONDITION:  Stable to recovery.   BRIEF CLINICAL NOTE:  [name] is a 77 year old male who underwent a right  total knee arthroplasty last week.  He was getting ready to go home on 04/14  and fell in his room, sustaining a right intertrochanteric femur fracture.  We had to wait for his INR to normalize and now he presents for  intramedullary nailing.   PROCEDURE IN DETAIL:  After successful administration of general anesthetic,  the patient was placed on the fracture table, right lower extremity in a  well-padded traction boot and left lower extremity in the well-padded leg  holder.  Traction was applied under fluoroscopic guidance to get anatomic  reduction to this intertrochanteric/subtrochanteric femur fracture.  Thigh  was then prepped and draped in usual sterile fashion.  The guide pin for the  Ace trochanteric nail was passed percutaneously into the tip of the  trochanter into the femoral canal.  About a 2-3 inch incision is made around  the guide pin and the tissue protector and proximal reamer passed.  Proximal  reaming is performed and then the guide pin removed.  The 130 degrees 13 mm  diameter nail was then passed into the femoral canal via the  tip of the  trochanter.  We placed it down to a depth where the guide pin and lag screw  would be able to be in the center of femoral head and neck.  Once we got it  down to the appropriate depth, then we passed the tissue sleeve and  protector through the external guide for placement of the compression lag  screw.  A small incision was made and then the guide pin is passed into the  center of femoral head and neck on both the AP and lateral views.  The  length of this was 115 mm.  A triple reamer was passed over the guide pin  and then the bone is tapped.  The 115 mm compression screw was then passed  into the center of femoral head neck on the AP and lateral views.  We then  placed the distal interlock through the external guide through a small  incision.  This was 42 mm in length.  Fluoro spot taken AP and lateral  showed excellent reduction of fracture.  We then removed the  external guide and thoroughly irrigated  each incision with saline.  Tissues  were closed deep with 0 Vicryl, subcu 2-0 Vicryl, skin with staples.  Incisions cleaned and dried and bulky sterile dressing applied.  He was then  awakened and transferred to recovery in stable condition.      Ollen Gross, M.D.  Electronically Signed     FA/MEDQ  D:  04/09/2006  T:  04/10/2006  Job:  [id number]

## 2011-05-12 NOTE — Cardiovascular Report (Signed)
[place]  Patient:    Kyle Brewer, Kyle Brewer                      MRN: [id number] Proc. Date: 09/19/00 Adm. Date:  [id number] Attending:  Lenoria Farrier CC:         Leroy Sea., M.D.  Cath Lab   Cardiac Catheterization  INDICATIONS:  [name] is 77 years old and had bypass surgery in 1994.  He had patent grafts in 1999.  He recently had a screening Cardiolite scan which showed inferior ischemia and he was scheduled for evaluation with catheterization.  DESCRIPTION OF PROCEDURE:  The procedure was performed by the right femoral artery using arterial sheath and 6 French preformed coronary catheters. Arterial puncture was performed and Omnipaque contrast was used.  A JR4 catheter was used for injection of the vein graft.  A LIMA catheter was used for injection of the LIMA graft.  This was performed to rule out abdominal aortic aneurysm.  The patient tolerated the procedure well and left the laboratory in satisfactory condition.  RESULTS:  The left main coronary artery had a 50 to 70% stenosis.  The left anterior descending artery was diffusely irregular with two septal perforators and a diagonal branch in its proximal portion and then competing flow distally.  The circumflex artery was a codominant vessel that gave rise to two marginal branches and two posterolateral branches.  There was 80% stenosis in the first marginal branch and 70% stenosis in the proximal vessel after the first two marginal branches.  The right coronary artery was a small vessel that gave rise to a clonus branch, right ventricular branch, and two small posterior descending branches. These vessels were free of significant disease.  The left ventriculogram was performed in the RAO projection and showed good wall motion with no areas of hypokinesis.  The estimated ejection fraction was 60%.  A distal aortogram was performed which showed patent renal arteries  and no significant aortoiliac obstruction.  CONCLUSION:  Coronary artery disease, status post coronary artery bypass graft surgery in 1994.  The native circulation shows 50-70% stenosis in the left main artery, irregularities in the LAD, 80% stenosis in the first marginal, and 70% stenosis in the proximal portion of the circumflex artery, and a normal small right coronary artery.  The grafts showed a patent LIMA graft to the LAD and a patent sequential vein graft to the first and second marginal branches and posterolateral branch of the circumflex artery.  The left ventricular function was normal.  RECOMMENDATIONS:  The patient has no source of ischemia on angiography.  I suspect his recent Cardiolite scan represents a false positive test.  Will plan reassurance and continued risk factor modification.  ADDENDUM:  The right femoral artery was closed with Perclose at the end of the procedure. DD:  09/19/00 TD:  09/19/00 Job: [id number]

## 2011-05-12 NOTE — H&P (Signed)
NAME:  Kyle Brewer, Kyle Brewer NO.:  [id number]   MEDICAL RECORD NO.:  [id number]           PATIENT TYPE:   LOCATION:                                 FACILITY:   PHYSICIAN:  Ollen Gross, M.D.         DATE OF BIRTH:   DATE OF ADMISSION:  DATE OF DISCHARGE:                                HISTORY & PHYSICAL   CHIEF COMPLAINT:  \  Right knee pain.   HISTORY OF PRESENT ILLNESS:  The patient is a 77 year old male seen by Dr.  Lequita Halt for bilateral knee pain.  Right knee is more symptomatic and  problematic than the left.  He has had trouble now for quite some time.  It  has progressively gotten worse.  He is at the stage now where he is having  constant pain.  It is limiting what he can and cannot do.  He is seen in the  office where x-rays show end-stage arthritis in both knees, mainly about  knee to compartment, worse on the right than the left but also involving the  patellofemoral compartment, again worse on the right than the left.  It is  felt he has reached the point where he could benefit undergoing surgery.  Risks and benefits have been discussed and the patient subsequently is  admitted to the hospital.  He has recently undergone a cardiac stress test  and felt to be low risk adenosine nuclear study, but no ischemia  appreciated.   ALLERGIES:  No known drug allergies.   CURRENT MEDICATIONS:  Hytrin, Norvasc, Zocor, Synthroid, Avodart, Toprol,  lisinopril, Meloxicam, Lorazepam, baby aspirin.   PAST MEDICAL HISTORY:  1.  Past history of pulmonary infection in spring and summer 2005.  2.  Hypercholesterolemia.  3.  Hypertension.  4.  Coronary arterial disease.  5.  History of benign prostatic hypertrophy.  6.  History of renal calculi.  7.  Hypothyroidism.  8.  Gastroesophageal reflux disease, also hiatal hernia.  9.  History of azotemia.   PAST SURGICAL HISTORY:  1.  A procedure for pleurisy.  2.  Cardiac catheterizations x3.  3.  Appendectomy.  4.   Coronary artery bypass grafting of three vessels in 1994.  5.  TURP in 2005.  6.  Recent cataract surgery in 2006.   SOCIAL HISTORY:  Widowed, retired.  Currently a nonsmoker, quit back in the  1960s.  No alcohol.  Three children.   FAMILY HISTORY:  Mother with history of heart disease, father and two  brothers with history of lung cancer.   REVIEW OF SYSTEMS:  GENERAL:  No fevers, chills, night sweats.  NEUROLOGIC:  No seizures, syncope, paralysis.  RESPIRATORY:  He does get a little  shortness of breath with exertion.  No shortness of breath at rest.  No  productive cough or hemoptysis.  CARDIOVASCULAR:  Significant cardiac  history, but denies any chest pains or orthopnea.  GASTROINTESTINAL:  History of reflux, hiatal hernia, asymptomatic.  No nausea, vomiting,  diarrhea, or constipation.  GENITOURINARY:  A little bit of weak stream  secondary to the  prostate problems, but no dysuria, hematuria, or discharge.  MUSCULOSKELETAL:  Right knee.   PHYSICAL EXAMINATION:  VITAL SIGNS:  Pulse 64, respirations 16, blood  pressure 138/62.  GENERAL:  A 77 year old white male, well-nourished, well-developed, no acute  distress, alert and oriented,cooperative and, very pleasant.  He is a good  historian.  HEENT:  Normocephalic, atraumatic.  Pupils are round and reactive.  Oropharynx is clear.  EOMs are intact.  NECK:  Supple.  Faint right carotid bruit down on the left.  CHEST:  Clear, anterior/posterior chest walls.  No rhonchi, rales, or  wheezing.  HEART:  Regular rhythm with a grade 2-3/6 aortic systolic murmur.  ABDOMEN:  Soft, slightly round, nontender.  Bowel sounds present.  No  organomegaly is appreciated.  EXTREMITIES:  Right knee:  Right knee shows moderate crepitus on passive  range of motion.  Motor function is intact.  Range of motion of 10 to about  110 degrees.  No effusion appreciated.   IMPRESSION:  1.  Osteoarthritis, bilateral knees, right more symptomatic than left.   2.  History of pulmonary nocardiosis.  3.  Past history of pulmonary infection, 2005.  4.  Hyperlipidemia.  5.  Hypertension.  6.  Coronary arterial disease.  7.  Hiatal hernia.  8.  Reflux disease.  9.  Benign prostatic hypertrophy.  10. History of renal calculi.  11. History of azotemia.  12. Hypothyroidism.   PLAN:  The patient will be admitted to [facility] to undergo a  right total knee arthroplasty.  His cardiologist is Dr. Juanda Chance.  His medical  doctor is Dr. Timothy Lasso.  Both will be notified of the room number and admission  and will be consulted if needed for medical assistance with this patient  throughout the hospital course.      Alexzandrew L. Julien Girt, P.A.      Ollen Gross, M.D.  Electronically Signed    ALP/MEDQ  D:  04/01/2006  T:  04/01/2006  Job:  [id number]   cc:   Ollen Gross, M.D.  Fax: [phone]   Gwen Pounds, MD  Fax: [phone]   Charlies Constable, M.D. [facility]  [address]  [address]  [place]  [place] [address]

## 2011-05-12 NOTE — Discharge Summary (Signed)
Kyle Brewer, Kyle Brewer               ACCOUNT NO.:  [id number]   MEDICAL RECORD NO.:  [id number]          PATIENT TYPE:  INP   LOCATION:  1422                         FACILITY:  [facility]   PHYSICIAN:  Ollen Gross, M.D.    DATE OF BIRTH:  05/19/1927   DATE OF ADMISSION:  04/02/2006  DATE OF DISCHARGE:  04/12/2006                                 DISCHARGE SUMMARY   ADMISSION DIAGNOSES:  1.  Osteoarthritis, bilateral knees, right more symptomatic than left.  2.  Past history of pulmonary infection, 2005.  3.  Hyperlipidemia.  4.  Hypertension.  5.  Coronary arterial disease.  6.  Hiatal hernia.  7.  Reflux disease.  8.  Benign prostatic hypertrophy.  9.  History of renal calculi.  10. History of azotemia.  11. Hypothyroidism.   DISCHARGE DIAGNOSES:  1.  Osteoarthritis, right knee, status post right total knee arthroplasty.  2.  Osteoarthritis, left knee.  3.  Right intertrochanteric/subtrochanteric femur fracture secondary to      fall.  4.  Status post intramedullary nailing, right femur.  5.  Postoperative blood loss anemia.  6.  Postoperative transfusion without sequelae.  7.  Past history of pulmonary infection, 2005.  8.  Hyperlipidemia.  9.  Hypertension.  10. Coronary arterial disease.  11. Hiatal hernia.  12. Reflux disease.  13. Benign prostatic hypertrophy.  14. History of renal calculi.  15. History of azotemia.  16. Hypothyroidism.  17. Postoperative orthostatic tension, improved with fluids.   PROCEDURES:  1.  April 02, 2006, right total knee.  Surgeon, Ollen Gross, M.D.      Assistant, Alexzandrew L. Perkins, P.A.-C.  Anesthesia:  General,      postoperative Marcaine pain pump.  Tourniquet time 43 minutes.  2.  April 09, 2006, intramedullary nailing, right femur.  Surgeon, Ollen Gross, M.D.  Assistant, Alexzandrew L. Perkins, P.A.-C.  Anesthesia:      General.   CONSULTATIONS:  Rehabilitation services, Ranelle Oyster, M.D.   BRIEF HISTORY:  The  patient is a 77 year old male with end-stage  osteoarthritis of the right knee with intractable pain syndrome and varus  deformity, who now presents for a total knee arthroplasty.   LABORATORY DATA:  Preop CBC on admission showed a hemoglobin of 12.5,  hematocrit 38.5, normal white count of 6.6.  Serial H&H's were followed.  Hemoglobin dropped postoperatively down to 8.8 following his knee  replacement.  He was given one unit of blood, which pushed his hemoglobin  back up to 9.4.  When it got back up to 9.7, it drifted back down to 9.4.  After hip surgery it drifted down to 8.8 and was last noted at 8.8 with a  hematocrit of 26.4.  PT/PTT on admission 13.7 and 35, respectively, with an  INR of 1.0.  Serial protimes followed, last noted PT/INR of 23.9 and 2.1.  Chemistry panel on admission:  Slightly elevated potassium of 5.8, elevated  BUN and creatinine of 25 and 1.7 preoperatively.  Remaining chemistry panel  all within normal limits.  Serial BMETs were followed.  BUN  and creatinine  came down into normal limits, so did the potassium.  Potassium last noted at  4.1, BUN and creatinine last noted at 19 and 1.3.  Preoperative  UA:  Trace  leukocyte esterase with 0-2 white, 0-2 red, otherwise negative.  Blood group  type O positive.   X-RAYS:  Two-view chest March 23, 2006:  Cardiomegaly without congestive  failure, faint residual soft tissue density in the right paratracheal space  at the site of the right paratracheal mass in 2005.  Correlation with the  pathology of this mass treatment was recommended.  This area could relate to  treatment effects but is nonspecific.  Left-sided pleural thickening.  Moderate hiatal hernia.  Subcentimeter density of the right lung, frontal  view, could relate to vessel on end or granuloma.  Consider plain film.  Follow up x-ray in three to six months to confirm stability of the area.  If  prior mass malignant, then evaluation with CT scan.  Chest x-ray  was  reviewed.  Please note, chest x-ray was reviewed by Dr. Timothy Lasso and he  recommended follow-up chest x-ray in three to six months as recommended.  Portable hip film April 07, 2006, impacted intertrochanteric,  subtrochanteric femur fracture.  Intraoperative C-spot films used, six  intraoperative fluoroscopic spot films show dynamic hip screw fixation of  intertrochanteric fracture.   HOSPITAL COURSE:  The patient was admitted to [facility] and  tolerated procedure #1 well without difficulty.  He was started on PCA and  p.o. analgesics for pain control following surgery.  Sent to medical-  surgical floor for postoperative care, Dr. Timothy Lasso was the patient's medical  physician.  He was consulted postop to assist with medical management due to  his extensive medical history.  On day #1 he was doing well.  History of  azotemia, but his BUN and creatinine were stable postoperatively.  He had  good urinary output.  Encouraged liquids until passing flatus.  The drain  was removed on day #1.  By day #2, he had a little discomfort through the  night.  Encouraged pills.  Hemoglobin had dropped down to 9.3.  He was  placed on iron.  He was placed on DVT prophylaxis.  He started getting up  with physical therapy.  The dressing was changed on day #2.  The incision  looked good.  On day #2 when he was getting up with physical therapy, he had  some orthostatic complaints, some dizziness.  Orthostatic pressures were  taken and found that he had some orthostatic hypotension.  He was treated  with fluid boluses and then responded well to the fluid challenges.  His  hemoglobin was noted to be 8.8 on day #3, at which time he was given one  unit of blood.  BUN and creatinine were checked again and remained stable.  He responded well with the fluids.  It was felt that he was orthostatic due  to blood loss.  He tolerated the one unit well.  His post-transfusion hemoglobin came back up to 9.4.  His  azotemia and renal insufficiency would  remain stable.  He was given Reglan for postoperative nausea.  By day #4, he  was not eating well, no vomiting but still a little bit of intermittent  nausea.  He was passing gas.  A suppository was used.  From an orthopedic  standpoint it felt like he was progressing well and may be able to go home  over the weekend.  By  this time he was up ambulating approximately 200 feet  by day #3 and day #4.  Once he was doing well he was set up to go home over  the weekend.  On April 07, 2006, unfortunately, right before he was getting  ready to leave the hospital he fell and sustained an intertrochanteric,  subtrochanteric femur fracture.  He was placed at bed rest, given  medications, placed in Buck's traction until Dr. Lequita Halt returned.  He was  treated with pain medications and bed rest for the fracture.  He was  preopped and made n.p.o.  Coumadin was held.  He was rechecked.  His INR  came down to 1.2 by Monday.  He was added on and taken back to the operating  room on Monday, the 16th, and underwent procedure #2 as above.  He tolerated  the procedure well.  His cardiac history remained stable.  His Hytrin had  been decreased because of the orthostatic problems.  On day #1 after the hip  surgery he was doing pretty well but he did have some discomfort in his hip.  He was placed on a telemetry floor for monitoring postoperatively.  Hemoglobin was 9.5.  He was already low going in so he was typed and  crossed.  He was monitored from that standpoint.  Due to the unfortunate  fracture and requiring a second surgery during the hospitalization, a rehab  consult was called.  He was seen by rehab services, who felt that he would  likely need inpatient rehab.  Therapy was resumed after the hip fracture  surgery.  He still had some intermittent nausea.  A trial of Zofran was  used, which did help.  By day #2, he was starting to get up with physical  therapy out of  bed but he was not doing any ambulation at that point.  We  resumed his therapy.  The right knee incision had healed well.  The right  hip incision on day #2, his dressing was changed.  He had a little bit of  serous drainage from his proximal incision around the thigh, the two distal  incision puncture holes were healing well.  His hemoglobin had dropped down  to 8.8 but at this point he was not symptomatic.  He was rechecked and was  stable on postop day #4 on April 12, 2006, at 8.8.  He was placed on iron  supplementation in the form of Trinsicon.  They were recommending skilled  nursing facility versus acute rehab; therefore, discharge planning was  trying to find a bed.  They found a bed at one of the extended care  facilities on April 18 but he was not ready to go from that standpoint.  By  April 19 his hemoglobin was 8.8 one day and 8.8 the following day, so it had  stabilized.  He was on the iron supplement.  We rechecked availabilities of beds.  It was noted the afternoon of April 12, 2006, that a bed did become  available at the [facility] rehab facility.  This was the patient's first  choice.  The bed offer was accepted.  He was doing well from a therapy  standpoint, slow progressive due to the knee surgery and the hip fracture  surgery, but he was progressing.  We felt that he would benefit better in a  skilled nursing facility.  A bed was available at that afternoon.  Information was given to Dr. Lequita Halt and the patient was transferred out  at  that time.   DISCHARGE PLAN:  The patient was transferred to [facility] on April 12, 2006.   DISCHARGE DIAGNOSES:  Please see above.   DISCHARGE MEDICATIONS:  1.  Coumadin, please titrate the Coumadin level between 2.0 and 3.0.  He      will need to be on Coumadin a minimum of three weeks.  Coumadin protocol      for DVT prophylaxis.  2.  Colace 100 mg p.o. b.i.d.  3.  Avodart 0.5 mg p.o. daily.  4.  Levothyroxine 50 mcg p.o.  daily.  5.  Zocor 20 mg daily.  6.  Toprol XL 100 mg sustained release p.o. daily.  7.  Norvasc 5 mg p.o. daily, hold if systolic pressure less than 110.  8.  Iron supplement in the form of Nu-Iron 150 mg daily.  9.  Hytrin 5 mg p.o. q.h.s.  10. Lisinopril 10 mg p.o. daily.  11. Zofran 4-8 mg p.o. q.8h. p.r.n. nausea.  12. Dulcolax suppository per rectum p.r.n.  13. Fleet's enema per rectum p.r.n.  14. Laxative of choice.  15. Enema of choice.  16. Tylenol one or two every four to six hours as needed for pain.  17. Robaxin 500 mg p.o. q.6-8h. p.r.n. spasm.  18. Ativan 0.5 mg p.o. q.h.s. p.r.n. anxiety.  19. Milk of magnesia or Mylanta 30 mL p.o. p.r.n.  20. Vicodin 5 mg one or two tablets p.o. every four to six hours as needed      for pain.   DIET:  Cardiac diet.   ACTIVITY:  He is weightbearing as tolerated to the right lower extremity.  He needs to continue with total knee protocol with range of motion and  strengthening exercises to the right knee.  He also needs to continue with  the hip fracture protocol with weightbearing as tolerated, gait training,  ambulation as per PT and OT for ADLs.   Daily dressing changes to the right hip.  He is still having a little bit of  serous drainage from the proximal incision.  Change daily.  The right knee  incision has healed well.  He is nearly 10 days out and does not need any  dressing change, just wound observation.  He may start showering but do not  submerge incisions under water.   FOLLOW-UP:  The patient needs to follow up in the office in the next seven  to 10 days with [facility] and Dr. Ollen Gross, over at  [facility] building.  Please contact the office at [phone] to arrange  appointment time and help assist with transfer of the patient for follow-up  care in the next seven to 10 days.   DISPOSITION:  [facility].   CONDITION ON DISCHARGE:  Slow but improving.      Alexzandrew L.  Julien Girt, P.A.     Ollen Gross, M.D.  Electronically Signed    ALP/MEDQ  D:  04/12/2006  T:  04/12/2006  Job:  [id number]   cc:   Gwen Pounds, MD  Fax: [phone]   Charlies Constable, M.D. [facility]  [address]  [address]  [place]  [place] [address]

## 2011-05-31 ENCOUNTER — Encounter (HOSPITAL_BASED_OUTPATIENT_CLINIC_OR_DEPARTMENT_OTHER): Payer: Medicare Other | Attending: Plastic Surgery

## 2011-05-31 DIAGNOSIS — M7020 Olecranon bursitis, unspecified elbow: Secondary | ICD-10-CM | POA: Insufficient documentation

## 2011-05-31 DIAGNOSIS — X58XXXA Exposure to other specified factors, initial encounter: Secondary | ICD-10-CM | POA: Insufficient documentation

## 2011-05-31 DIAGNOSIS — S51009A Unspecified open wound of unspecified elbow, initial encounter: Secondary | ICD-10-CM | POA: Insufficient documentation

## 2011-07-05 ENCOUNTER — Encounter (HOSPITAL_BASED_OUTPATIENT_CLINIC_OR_DEPARTMENT_OTHER): Payer: Medicare Other | Attending: Plastic Surgery

## 2011-07-05 DIAGNOSIS — L98499 Non-pressure chronic ulcer of skin of other sites with unspecified severity: Secondary | ICD-10-CM | POA: Insufficient documentation

## 2011-07-05 NOTE — Progress Notes (Signed)
[facility]  NAME:  Kyle Brewer, Kyle Brewer NO.:  [id number]  MEDICAL RECORD NO.:  [id number]      DATE OF BIRTH:  10/27/1927  PHYSICIAN:  Wayland Denis, DO       VISIT DATE:  07/05/2011                                  OFFICE VISIT   Kyle Brewer is an 77 year old white male who is here for followup on his right elbow wound.  He has been using Oasis and trying to improve his overall health with protein and multivitamins.  He is doing very well with decreased size of the distal wound and the superior wound healed. He does not complain of any pain.  There is no change in his social history, medications, or review of systems.  PHYSICAL EXAMINATION:  GENERAL:  He is alert, oriented, cooperative, in no acute distress.  He is very pleasant. HEENT:  His pupils are equal.  Extraocular muscles are intact. NECK:  No cervical lymphadenopathy. LUNGS:  Clear. HEART:  Regular. ABDOMEN:  Soft.  Wound is as described.  Oasis was placed, and we will see him back in 1 week.     Wayland Denis, DO     CS/MEDQ  D:  07/05/2011  T:  07/05/2011  Job:  [id number]

## 2011-07-06 NOTE — Progress Notes (Signed)
[facility]  NAME:  HAFIZ, IRION                    ACCOUNT NO.:  MEDICAL RECORD NO.:  [id number]      DATE OF BIRTH:  1927/07/17  PHYSICIAN:  Wayland Denis, DO       VISIT DATE:  06/07/2011                                  OFFICE VISIT   Mr. Stidham is an 77 year old white male who is here for reevaluation of his right elbow wound.  He has been using Oasis and has done extremely well with a marked decrease in size.  He is very pleased with his progress.  PHYSICAL EXAMINATION:  GENERAL:  He is alert, oriented, and cooperative in no acute distress.  He is very pleasant. HEENT:  His pupils are equal.  Extraocular muscles are intact. NECK:  Supple. LUNGS:  Breathing is unlabored. HEART:  Regular. ABDOMEN:  Soft. EXTREMITIES:  The wound size is recorded and description is noted. There is no sign of infection, inflammation, and is in the process of healing.  There is no change in his medications or social history.  REVIEW OF SYSTEMS:  Unchanged.  ASSESSMENT:  Right elbow wound.  PLAN:  We are going to do triple antibiotic ointment daily this week and reassess it next week.     Wayland Denis, DO     CS/MEDQ  D:  06/07/2011  T:  06/08/2011  Job:  [id number]

## 2011-07-12 NOTE — Progress Notes (Signed)
[facility]  NAME:  Kyle Brewer, Kyle Brewer NO.:  [id number]  MEDICAL RECORD NO.:  [id number]      DATE OF BIRTH:  Apr 27, 1927  PHYSICIAN:  Wayland Denis, DO       VISIT DATE:  07/12/2011                                  OFFICE VISIT   Kyle Brewer is an 77 year old gentleman, who has had a wound on his right elbow originally from trauma.  He has been using Oasis for the past several weeks with improvement.  He does have a little bit of tunneling. Overall, he is doing well.  The bone I can feel with the tunneling, but does have skin overlying it.  The superior wound has completely healed. Overall, he is pleased with the progress, although slow.  There has been no change in his medical history, medications, or review of systems which is negative.  PHYSICAL EXAMINATION:  GENERAL:  On exam, he is alert, oriented, cooperative in no acute distress.  He is pleasant.  HEENT:  His pupils are equal.  Extraocular muscles are intact. NECK:  No cervical lymphadenopathy. LUNGS:  Clear. HEART:  Regular. ABDOMEN:  Soft. EXTREMITIES:  Pulses are strong and regular.  The wound is as described previously in the nurse's notes.  We will continue with Oasis and have him follow up in 1 week.     Wayland Denis, DO     CS/MEDQ  D:  07/12/2011  T:  07/12/2011  Job:  [id number]

## 2011-07-19 NOTE — Progress Notes (Signed)
[facility]  NAME:  Kyle Brewer, Kyle Brewer                    ACCOUNT NO.:  MEDICAL RECORD NO.:  [id number]      DATE OF BIRTH:  [birth year]  PHYSICIAN:  Wayland Denis, DO       VISIT DATE:  07/19/2011                                  OFFICE VISIT   Mr. Seckman is an 77 year old white male who is here for reevaluation of his right elbow ulcer.  Today, he has a scab over the area with no open portion of it.  He has been getting OASIS weekly and has seemed to have done very well with that.  The scab feels adherent, so at this point, I am not going to pull it off, we are going to wait and give it a week to monitor it.  There has been no change in his medications or social history.  He seems actually more pleasant today.  PHYSICAL EXAMINATION:  GENERAL:  He is alert and oriented, cooperative, in no acute distress. His pupils are equal.  Extraocular muscles are intact.  No cervical lymphadenopathy. LUNGS:  Clear. HEART:  Regular. ABDOMEN:  Soft. WOUND:  The superior wound is completely healed.  The lower wound is as described.  No sign of infection.  PLAN:  Just use triple antibiotic ointment on the area daily and we will see him back in 1 week.     Wayland Denis, DO     CS/MEDQ  D:  07/19/2011  T:  07/19/2011  Job:  [id number]

## 2011-07-26 ENCOUNTER — Encounter (HOSPITAL_BASED_OUTPATIENT_CLINIC_OR_DEPARTMENT_OTHER): Payer: Medicare Other | Attending: Plastic Surgery

## 2011-07-26 DIAGNOSIS — L98499 Non-pressure chronic ulcer of skin of other sites with unspecified severity: Secondary | ICD-10-CM | POA: Insufficient documentation

## 2011-07-26 NOTE — Progress Notes (Signed)
[facility]  NAME:  AKIL, HOOS NO.:  [id number]  MEDICAL RECORD NO.:  [id number]      DATE OF BIRTH:  10-26-1927  PHYSICIAN:  Wayland Denis, DO       VISIT DATE:  07/26/2011                                  OFFICE VISIT   Kyle Brewer is an 77 year old man, who has been seen here for his right elbow ulcer.  He has actually done extremely well and the ulcer has healed.  He accidently bumped it, which broke the scab a little bit, but overall he is doing well.  He is still eating his Juven.  There has been no change in his social or medical history.  PHYSICAL EXAMINATION:  GENERAL:  He is alert, oriented, cooperative, in no acute distress. HEENT:  Pupils are equal.  Extraocular muscles are intact. NECK:  No cervical lymphadenopathy. LUNGS:  Clear. HEART:  Regular. ABDOMEN:  Soft.  The wound is as described in the nurse's note, improved from last week. This may be an ongoing problem for him due to the fragility of that area.  This was opened up again and wound care is not the quick fix for him and he may need a flap, but he knows to continue with the padding, triple antibiotic ointment every couple of days and Eucerin cream to keep them moist and prevent it from cracking and opening.     Wayland Denis, DO     CS/MEDQ  D:  07/26/2011  T:  07/26/2011  Job:  [id number]

## 2011-09-27 ENCOUNTER — Telehealth: Payer: Self-pay | Admitting: Gastroenterology

## 2011-09-27 NOTE — Telephone Encounter (Signed)
Patient has a history with Dr Russella Dar.  He is c/o a hernia in his right groin. He reports he has to press on his side to reduce it.  He reports that he has to reduce his hernia several times a day by lying down.  He denies fever and says that the pain is not constant.  He has an appt scheduled with Dr Derrell Lolling that he has canceled.  He is asked to call back to [facility] and see if they can move up his appt.  He is advised he needs to see surgery or the ER if his pain worsens not GI.  He is provided the information to Dr Aura Camps office

## 2011-09-27 NOTE — Telephone Encounter (Signed)
Agree 

## 2011-09-27 NOTE — Telephone Encounter (Signed)
Phone busy I will continue to try and reach the patient 

## 2011-09-28 ENCOUNTER — Ambulatory Visit: Payer: PRIVATE HEALTH INSURANCE | Admitting: Gastroenterology

## 2011-09-29 ENCOUNTER — Encounter: Payer: Self-pay | Admitting: Cardiovascular Disease

## 2011-10-02 ENCOUNTER — Encounter (INDEPENDENT_AMBULATORY_CARE_PROVIDER_SITE_OTHER): Payer: Self-pay

## 2011-10-02 ENCOUNTER — Ambulatory Visit (INDEPENDENT_AMBULATORY_CARE_PROVIDER_SITE_OTHER): Payer: Medicare Other | Admitting: Surgery

## 2011-10-02 ENCOUNTER — Encounter (INDEPENDENT_AMBULATORY_CARE_PROVIDER_SITE_OTHER): Payer: Self-pay | Admitting: Surgery

## 2011-10-02 VITALS — BP 142/76 | HR 64 | Temp 97.4°F | Resp 20 | Ht 69.0 in | Wt 180.6 lb

## 2011-10-02 DIAGNOSIS — K4020 Bilateral inguinal hernia, without obstruction or gangrene, not specified as recurrent: Secondary | ICD-10-CM

## 2011-10-02 NOTE — Progress Notes (Addendum)
Subjective:     Patient ID: Kyle Brewer, male   DOB: 03/25/1927, 77 y.o.   MRN: [id number]  HPI  Patient Care Team: Gwen Pounds, MD as PCP - General (Internal Medicine) Cephas Darby (Cardiology) Meryl Dare, MD,FACG (Gastroenterology) Wayland Denis, DO as Surgeon (Plastic Surgery)  This patient is a 77 y.o.male who presents today for surgical evaluation.   Reason for visit: Right groin swelling, probable hernia  The patient is a relatively active, independent elderly male. He noted several months ago some increased groin discomfort. Worse with activity and twisting. Often will balloon out now. He gets a burning sensation also. Based concerns, he was evaluated by his primary care physician. Dr. Timothy Lasso was concerned about a hernia and sent the patient to [place] for surgical evaluation.    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 Most recently, Dr. Kelly Splinter has evaluated. She had discussion about possibly a flap to close the wound but currently is following expectantly for now. He has not been on antibiotics recently but he has in the past for this.  The patient has a bowel movement everyday. He gets tired after working about 10-20 minutes but no severe exertional chest pain. He had a cardiac bypass 18 years ago. Has some difficulty with urination status post TURP and is on prostate medications.    Past Medical History  Diagnosis Date  . Inguinal hernia     right  . Osteoporosis   . Thyroid disease   . Hyperlipidemia   . Arthritis   . BPH (benign prostatic hyperplasia)   . Wound, open, elbow     Right, followed [facility]    Past Surgical History  Procedure Date  . Appendectomy 1968  . Coronary artery bypass graft 1995  . Total knee arthroplasty 2007    right  . Elbow arthroscopy 2012    right  . Transurethral resection of prostate     Dr. Isabel Caprice     History   Social History  . Marital Status: Widowed    Spouse Name:  N/A    Number of Children: N/A  . Years of Education: N/A   Occupational History  . Not on file.   Social History Main Topics  . Smoking status: Former Games developer  . Smokeless tobacco: Not on file   Comment: quit 1965  . Alcohol Use: No  . Drug Use: No  . Sexually Active:    Other Topics Concern  . Not on file   Social History Narrative  . No narrative on file    Family History  Problem Relation Age of Onset  . Cancer Father     lung    Current outpatient prescriptions:amLODipine (NORVASC) 5 MG tablet, Take 5 mg by mouth daily.  , Disp: , Rfl: ;  Ascorbic Acid (VITAMIN C PO), Take by mouth.  , Disp: , Rfl: ;  aspirin 81 MG tablet, Take 81 mg by mouth daily.  , Disp: , Rfl: ;  CALCIUM PO, Take by mouth.  , Disp: , Rfl: ;  Cyanocobalamin (VITAMIN B12 PO), Take by mouth.  , Disp: , Rfl: ;  dutasteride (AVODART) 0.5 MG capsule, Take 0.5 mg by mouth daily.  , Disp: , Rfl:  levothyroxine (SYNTHROID, LEVOTHROID) 50 MCG tablet, Take 50 mcg by mouth daily.  , Disp: , Rfl: ;  lisinopril (PRINIVIL,ZESTRIL) 10 MG tablet, Take 10 mg by mouth daily.  , Disp: ,  Rfl: ;  LORazepam (ATIVAN) 0.5 MG tablet, Take 0.5 mg by mouth every 8 (eight) hours.  , Disp: , Rfl: ;  meloxicam (MOBIC) 7.5 MG tablet, Take 7.5 mg by mouth daily.  , Disp: , Rfl:  metoprolol (LOPRESSOR) 100 MG tablet, Take 100 mg by mouth 2 (two) times daily.  , Disp: , Rfl: ;  Multiple Vitamin (ONE-A-DAY MENS PO), Take by mouth.  , Disp: , Rfl: ;  simvastatin (ZOCOR) 20 MG tablet, Take 20 mg by mouth at bedtime.  , Disp: , Rfl: ;  terazosin (HYTRIN) 10 MG capsule, Take 10 mg by mouth at bedtime.  , Disp: , Rfl:   No Known Allergies     Review of Systems  Constitutional: Negative for fever, chills and diaphoresis.  HENT: Positive for congestion. Negative for nosebleeds, sore throat, facial swelling, mouth sores, trouble swallowing and ear discharge.   Eyes: Negative for photophobia, discharge and visual disturbance.  Respiratory:  Negative for choking, chest tightness, shortness of breath and stridor.   Cardiovascular: Negative for chest pain and palpitations.  Gastrointestinal: Negative for nausea, vomiting, abdominal pain, diarrhea, constipation, blood in stool, abdominal distention, anal bleeding and rectal pain.       BM daily  Genitourinary: Negative for dysuria, urgency, difficulty urinating and testicular pain.  Musculoskeletal: Positive for arthralgias. Negative for myalgias, back pain and gait problem.  Skin: Positive for wound. Negative for color change, pallor and rash.       [facility]  Neurological: Negative for dizziness, speech difficulty, weakness, numbness and headaches.  Hematological: Negative for adenopathy. Does not bruise/bleed easily.  Psychiatric/Behavioral: Negative for hallucinations, confusion and agitation.       Objective:   Physical Exam  Constitutional: He is oriented to person, place, and time. He appears well-developed and well-nourished. No distress.  HENT:  Head: Normocephalic.  Mouth/Throat: Oropharynx is clear and moist. No oropharyngeal exudate.  Eyes: Conjunctivae and EOM are normal. Pupils are equal, round, and reactive to light. No scleral icterus.  Neck: Normal range of motion. Neck supple. No tracheal deviation present.  Cardiovascular: Normal rate, regular rhythm and intact distal pulses.   Pulmonary/Chest: Effort normal and breath sounds normal. No respiratory distress.  Abdominal: Soft. He exhibits no distension. There is no tenderness. There is no rebound and no guarding.  Genitourinary: Penis normal.    No penile tenderness.  Musculoskeletal: Normal range of motion. He exhibits no tenderness.  Lymphadenopathy:    He has no cervical adenopathy.       Right: No inguinal adenopathy present.       Left: No inguinal adenopathy present.  Neurological: He is alert and oriented to person, place, and time. No cranial nerve  deficit. He exhibits normal muscle tone. Coordination normal.  Skin: Skin is warm and dry. No rash noted. He is not diaphoretic. No erythema. No pallor.  Psychiatric: He has a normal mood and affect. His behavior is normal. Judgment and thought content normal.       Assessment:     BIH, symptomatic      Plan:     He is a rather active physical male. I do think a benefit for cardiac clearance since it has been about a year since his last evaluation by them.   He recalls he is due to be seen by somebody in [facility] cardiology. Dr. Juanda Chance he believes is recently retired.    I touched base with Dr. Kelly Splinter with the  wound care center.  No recent infections.  Wound stable & nearly always closed.   She did not have any major concerns of increased infection with a chronic wound and putting in mesh.  I did warn he is at increased risk of urinary retention. We will try to minimize the risk.  The anatomy & physiology of the abdominal wall was discussed.  The pathophysiology of hernias was discussed.  Natural history risks without surgery of enlargement, pain, incarceration & strangulation was discussed.   Contributors to complications such as smoking, obesity, diabetes, prior surgery, etc were discussed.  I feel the risks of no intervention will lead to serious problems that outweigh the operative risks; therefore, I recommended surgery to reduce and repair the hernia.  I explained laparoscopic techniques with possible need for an open approach.  I noted the probable use of mesh to patch and/or buttress hernia repair  Risks such as bleeding, infection, abscess, need for further treatment, heart attack, death, and other risks were discussed.  Goals of post-operative recovery were discussed as well.  Possibility that this will not correct all symptoms was explained.  I stressed the importance of low-impact activity, aggressive pain control, avoiding constipation, & not pushing through pain to minimize risk of  post-operative chronic pain or injury. Possibility of reherniation was discussed.  We will work to minimize complications.   An educational handout further explaining the pathology & treatment options was given as well.  Questions were answered.  The patient expresses understanding & wishes to proceed with surgery.

## 2011-10-02 NOTE — Patient Instructions (Signed)
Inguinal Hernia, Adult Muscles help keep everything in the body in its proper place. But if a weak spot in the muscles develops, something can poke through. That is called a hernia. When this happens in the lower part of the belly (abdomen), it is called an inguinal hernia. (It takes its name from a part of the body in this region called the inguinal canal.) A weak spot in the wall of muscles lets some fat or part of the small intestine bulge through. An inguinal hernia can develop at any age. Men get them more often than women. CAUSES In adults, an inguinal hernia develops over time.  It can be triggered by:   Suddenly straining the muscles of the lower abdomen.   Lifting heavy objects.   Straining to have a bowel movement. Difficult bowel movements (constipation) can lead to this.   Constant coughing. This may be caused by smoking or lung disease.   Being overweight.   Being pregnant.   Working at a job that requires long periods of standing or heavy lifting.   Having had an inguinal hernia before.  One type can be an emergency situation. It is called a strangulated inguinal hernia. It develops if part of the small intestine slips through the weak spot and cannot get back into the abdomen. The blood supply can be cut off. If that happens, part of the intestine may die. This situation requires emergency surgery. SYMPTOMS Often, a small inguinal hernia has no symptoms. It is found when a healthcare provider does a physical exam. Larger hernias usually have symptoms.   In adults, symptoms may include:   A lump in the groin. This is easier to see when the person is standing. It might disappear when lying down.   In men, a lump in the scrotum.   Pain or burning in the groin. This occurs especially when lifting, straining or coughing.   A dull ache or feeling of pressure in the groin.   Signs of a strangulated hernia can include:   A bulge in the groin that becomes very painful and  tender to the touch.   A bulge that turns red or purple.   Fever, nausea and vomiting.   Inability to have a bowel movement or to pass gas.  DIAGNOSIS To decide if you have an inguinal hernia, a healthcare provider will probably do a physical examination.  This will include asking questions about any symptoms you have noticed.   The healthcare provider might feel the groin area and ask you to cough. If an inguinal hernia is felt, the healthcare provider may try to slide it back into the abdomen.   Usually no other tests are needed.  TREATMENT Treatments can vary. The size of the hernia makes a difference. Options include:  Watchful waiting. This is often suggested if the hernia is small and you have had no symptoms.   No medical procedure will be done unless symptoms develop.   You will need to watch closely for symptoms. If any occur, contact your healthcare provider right away.   Surgery. This is used if the hernia is larger or you have symptoms.   Open surgery. This is usually an outpatient procedure (you will not stay overnight in a hospital). An cut (incision) is made through the skin in the groin. The hernia is put back inside the abdomen. The weak area in the muscles is then repaired by:  --Herniorrhaphy. In this type of surgery, the weak muscles are sewn  back together. --Hernioplasty. A patch or mesh is used to close the weak area in the abdominal wall.   Laparoscopy. In this procedure, a surgeon makes small incisions. A thin tube with a tiny video camera (called a laparoscope) is put into the abdomen. The surgeon repairs the hernia with mesh by looking with the video camera and using two long instruments.  HOME CARE INSTRUCTIONS  After surgery to repair an inguinal hernia:   You will need to take pain medicine prescribed by your healthcare provider. Follow all directions carefully.   You will need to take care of the wound from the incision.   Your activity will be  restricted for awhile. This will probably include no heavy lifting for several weeks. You also should not do anything too active for a few weeks. When you can return to work will depend on the type of job that you have.   During "watchful waiting" periods, you should:   Maintain a healthy weight.   Eat a diet high in fiber (fruits, vegetables and whole grains).   Drink plenty of fluids to avoid constipation. This means drinking enough water and other liquids to keep your urine clear or pale yellow.   Do not lift heavy objects.   Do not stand for long periods of time.   Quit smoking. This should keep you from developing a frequent cough.  SEEK MEDICAL CARE IF:  A bulge develops in your groin area.   You feel pain, a burning sensation or pressure in the groin. This might be worse if you are lifting or straining.   You develop a fever of more than 100.59F (38.1 C).  SEEK IMMEDIATE MEDICAL CARE IF:  Pain in the groin increases suddenly.   A bulge in the groin gets bigger suddenly and does not go down.   For men, there is sudden pain in the scrotum. Or, the size of the scrotum increases.   A bulge in the groin area becomes red or purple and is painful to touch.   You have nausea or vomiting that does not go away.   You feel your heart beating much faster than normal.   You cannot have a bowel movement or pass gas.   You develop a fever of more than 102.52F (38.9C).  Document Released: 04/29/2009 Document Re-Released: 05/31/2010 [facility] Patient Information 2011 [place], [place].

## 2011-10-10 ENCOUNTER — Encounter (INDEPENDENT_AMBULATORY_CARE_PROVIDER_SITE_OTHER): Payer: Self-pay | Admitting: General Surgery

## 2011-10-17 ENCOUNTER — Encounter: Payer: Self-pay | Admitting: Cardiovascular Disease

## 2011-10-19 ENCOUNTER — Ambulatory Visit (INDEPENDENT_AMBULATORY_CARE_PROVIDER_SITE_OTHER): Payer: Medicare Other | Admitting: Cardiovascular Disease

## 2011-10-19 ENCOUNTER — Encounter: Payer: Self-pay | Admitting: Cardiovascular Disease

## 2011-10-19 VITALS — BP 131/71 | HR 53 | Ht 69.0 in | Wt 183.8 lb

## 2011-10-19 DIAGNOSIS — I2510 Atherosclerotic heart disease of native coronary artery without angina pectoris: Secondary | ICD-10-CM

## 2011-10-19 DIAGNOSIS — Z01810 Encounter for preprocedural cardiovascular examination: Secondary | ICD-10-CM | POA: Insufficient documentation

## 2011-10-19 NOTE — Patient Instructions (Signed)
Your physician wants you to follow-up in:  12 months.  You will receive a reminder letter in the mail two months in advance. If you don't receive a letter, please call our office to schedule the follow-up appointment.   

## 2011-10-19 NOTE — Assessment & Plan Note (Signed)
Stable. No changes in therapy. Lipids are followed in primary care. Recent LDL 69.

## 2011-10-19 NOTE — Progress Notes (Signed)
History of Present Illness:  77 yo WM with history of CAD, HTN, HLD here today for cardiac follow up. He has been followed in the past by Dr. Juanda Chance. In 1995 he underwent 3V CABG per Dr. Andrey Campanile. In 2001 he had patent grafts. He had a negative Myoview scan 2010 with an ejection fraction of 64%.  He's done well over the past year with no chest pain or palpitations. His breathing has been at baseline. He has bilateral hernias and needs to have surgery. Dr. Karie Soda with [facility] is his surgeon.   Dr. Timothy Lasso is his primary care [facility]).   Past Medical History  Diagnosis Date  . Inguinal hernia     right  . Osteoporosis   . Thyroid disease   . Hyperlipidemia   . Arthritis   . BPH (benign prostatic hyperplasia)   . Wound, open, elbow     Right, followed [facility]  . Hypertension   . CAD (coronary artery disease)     3V CABG 1995, relook cath 2001 with patent grafts. normal stress test 2010.     Past Surgical History  Procedure Date  . Appendectomy 1968  . Coronary artery bypass graft 1995  . Total knee arthroplasty 2007    right  . Elbow arthroscopy 2012    right  . Transurethral resection of prostate     Dr. Isabel Caprice    Current Outpatient Prescriptions  Medication Sig Dispense Refill  . alendronate (FOSAMAX) 70 MG tablet Take 70 mg by mouth every 7 (seven) days. Take with a full glass of water on an empty stomach.       Marland Kitchen amLODipine (NORVASC) 5 MG tablet Take 1 tablet (5 mg total) by mouth daily.  90 tablet  2  . Ascorbic Acid (VITAMIN C PO) Take by mouth.        Marland Kitchen aspirin 81 MG tablet Take 81 mg by mouth daily.        Marland Kitchen CALCIUM PO Take 1 tablet by mouth 2 (two) times daily.       . Cyanocobalamin (VITAMIN B12 PO) Take by mouth.        . dutasteride (AVODART) 0.5 MG capsule Take 0.5 mg by mouth daily.        Marland Kitchen levothyroxine (SYNTHROID, LEVOTHROID) 50 MCG tablet Take 50 mcg by mouth daily.        . meloxicam (MOBIC) 7.5 MG tablet Take 7.5 mg by  mouth 2 (two) times daily.       . metoprolol (TOPROL XL) 100 MG 24 hr tablet Take 1 tablet (100 mg total) by mouth daily.  90 tablet  2  . Multiple Vitamin (ONE-A-DAY MENS PO) Take by mouth.        . simvastatin (ZOCOR) 20 MG tablet Take 20 mg by mouth at bedtime.        Marland Kitchen terazosin (HYTRIN) 10 MG capsule Take 10 mg by mouth at bedtime.          Allergies  Allergen Reactions  . Sulfamethoxazole W/Trimethoprim     REACTION: Reaction not known    History   Social History  . Marital Status: Widowed    Spouse Name: N/A    Number of Children: N/A  . Years of Education: N/A   Occupational History  . Not on file.   Social History Main Topics  . Smoking status: Former Games developer  . Smokeless tobacco: Not on file   Comment: quit 1965  . Alcohol Use:  No  . Drug Use: No  . Sexually Active:    Other Topics Concern  . Not on file   Social History Narrative  . No narrative on file    Family History  Problem Relation Age of Onset  . Cancer Father     lung    Review of Systems:  As stated in the HPI and otherwise negative.   BP 131/71  Pulse 53  Ht 5\' 9"  (1.753 m)  Wt 183 lb 12.8 oz (83.371 kg)  BMI 27.14 kg/m2  Physical Examination: General: Well developed, well nourished, NAD HEENT: OP clear, mucus membranes moist SKIN: warm, dry. No rashes. Neuro: No focal deficits Musculoskeletal: Muscle strength 5/5 all ext Psychiatric: Mood and affect normal Neck: No JVD, no carotid bruits, no thyromegaly, no lymphadenopathy. Lungs:Clear bilaterally, no wheezes, rhonci, crackles Cardiovascular: Regular rate and rhythm. No murmurs, gallops or rubs. Abdomen:Soft. Bowel sounds present. Non-tender.  Extremities: No lower extremity edema. Pulses are 2 + in the bilateral DP/PT.  EAV:WUJWJ bradycardia, rate 53 bpm. 1st degree AV block.

## 2011-10-19 NOTE — Assessment & Plan Note (Signed)
He has no angina, CHF or arrhythmias. Based on current ACC/AHA guidelines, no further cardiac testing is needed before his planned surgical procedure. He can proceed to surgery.

## 2011-10-25 ENCOUNTER — Telehealth (INDEPENDENT_AMBULATORY_CARE_PROVIDER_SITE_OTHER): Payer: Self-pay

## 2011-10-25 ENCOUNTER — Telehealth: Payer: Self-pay | Admitting: Cardiovascular Disease

## 2011-10-25 NOTE — Telephone Encounter (Signed)
Cardiac Clearance from 10/19/11 OV Visit faxed to [facility] @ [phone]  10/25/11/km

## 2011-10-25 NOTE — Telephone Encounter (Signed)
Left message to see about the cardiac clearance b/c the pt is calling me telling me he was cleared for sx. They will look into this and fax the clearance./ [facility]

## 2011-11-07 DIAGNOSIS — N433 Hydrocele, unspecified: Secondary | ICD-10-CM

## 2011-11-07 DIAGNOSIS — K4020 Bilateral inguinal hernia, without obstruction or gangrene, not specified as recurrent: Secondary | ICD-10-CM

## 2011-11-07 HISTORY — PX: HERNIA REPAIR: SHX51

## 2011-11-23 NOTE — Telephone Encounter (Signed)
Please close encounter

## 2011-11-29 ENCOUNTER — Ambulatory Visit (INDEPENDENT_AMBULATORY_CARE_PROVIDER_SITE_OTHER): Payer: Medicare Other | Admitting: Surgery

## 2011-11-29 ENCOUNTER — Encounter (INDEPENDENT_AMBULATORY_CARE_PROVIDER_SITE_OTHER): Payer: Self-pay | Admitting: Surgery

## 2011-11-29 VITALS — BP 128/66 | HR 62 | Temp 97.4°F | Resp 14 | Ht 70.0 in | Wt 183.6 lb

## 2011-11-29 DIAGNOSIS — K4020 Bilateral inguinal hernia, without obstruction or gangrene, not specified as recurrent: Secondary | ICD-10-CM

## 2011-11-29 NOTE — Progress Notes (Signed)
Subjective:     Patient ID: Kyle Brewer, male   DOB: 09/04/1927, 77 y.o.   MRN: [id number]  HPI  Kyle Brewer  11/17/1927 [id number]  Patient Care Team: Gwen Pounds, MD as PCP - General  This patient is a 77 y.o.male who presents today for surgical evaluation.   Procedure: Laparoscopic bilateral inguinal hernia repairs 11/07/2011  The patient comes in today feeling pretty good. Took only 3 pain pills. Going up and down stairs well. Urinating fine. No constipation. Energy level good.  Patient Active Problem List  Diagnoses  . COLONIC POLYPS  . HYPOTHYROIDISM  . Other and unspecified hyperlipidemia  . UNSPECIFIED ANEMIA  . HYPERTENSION, BENIGN  . HYPERTENSION  . CAD  . CAD, AUTOLOGOUS BYPASS GRAFT  . EXTERNAL HEMORRHOIDS  . ASTHMATIC BRONCHITIS, ACUTE  . COPD  . GERD  . NEPHROLITHIASIS  . DEGENERATIVE JOINT DISEASE  . DYSPNEA  . BENIGN PROSTATIC HYPERTROPHY, HX OF  . Pre-operative cardiovascular examination    Past Medical History  Diagnosis Date  . Inguinal hernia     right  . Osteoporosis   . Thyroid disease   . Hyperlipidemia   . Arthritis   . BPH (benign prostatic hyperplasia)   . Wound, open, elbow     Right, followed [facility]  . Hypertension   . CAD (coronary artery disease)     3V CABG 1995, relook cath 2001 with patent grafts. normal stress test 2010.     Past Surgical History  Procedure Date  . Appendectomy 1968  . Coronary artery bypass graft 1995  . Total knee arthroplasty 2007    right  . Elbow arthroscopy 2012    right  . Transurethral resection of prostate     Dr. Isabel Caprice  . Hernia repair 11/07/11    lap bilateral inguinal    History   Social History  . Marital Status: Widowed    Spouse Name: N/A    Number of Children: N/A  . Years of Education: N/A   Occupational History  . Not on file.   Social History Main Topics  . Smoking status: Former Games developer  . Smokeless tobacco: Never Used   Comment: quit 1965  . Alcohol Use: No    . Drug Use: No  . Sexually Active: Not on file   Other Topics Concern  . Not on file   Social History Narrative  . No narrative on file    Family History  Problem Relation Age of Onset  . Cancer Father     lung    Current outpatient prescriptions:alendronate (FOSAMAX) 70 MG tablet, Take 70 mg by mouth every 7 (seven) days. Take with a full glass of water on an empty stomach. , Disp: , Rfl: ;  amLODipine (NORVASC) 5 MG tablet, Take 1 tablet (5 mg total) by mouth daily., Disp: 90 tablet, Rfl: 2;  Ascorbic Acid (VITAMIN C PO), Take by mouth.  , Disp: , Rfl: ;  aspirin 81 MG tablet, Take 81 mg by mouth daily.  , Disp: , Rfl:  CALCIUM PO, Take 1 tablet by mouth 2 (two) times daily. , Disp: , Rfl: ;  Cyanocobalamin (VITAMIN B12 PO), Take by mouth.  , Disp: , Rfl: ;  dutasteride (AVODART) 0.5 MG capsule, Take 0.5 mg by mouth daily.  , Disp: , Rfl: ;  levothyroxine (SYNTHROID, LEVOTHROID) 50 MCG tablet, Take 50 mcg by mouth daily.  , Disp: , Rfl: ;  meloxicam (MOBIC) 7.5 MG tablet, Take 7.5 mg  by mouth 2 (two) times daily. , Disp: , Rfl:  metoprolol (TOPROL XL) 100 MG 24 hr tablet, Take 1 tablet (100 mg total) by mouth daily., Disp: 90 tablet, Rfl: 2;  Multiple Vitamin (ONE-A-DAY MENS PO), Take by mouth.  , Disp: , Rfl: ;  simvastatin (ZOCOR) 20 MG tablet, Take 20 mg by mouth at bedtime.  , Disp: , Rfl: ;  terazosin (HYTRIN) 10 MG capsule, Take 10 mg by mouth at bedtime.  , Disp: , Rfl:   Allergies  Allergen Reactions  . Sulfamethoxazole W/Trimethoprim     REACTION: Reaction not known    BP 128/66  Pulse 62  Temp(Src) 97.4 F (36.3 C) (Temporal)  Resp 14  Ht 5\' 10"  (1.778 m)  Wt 183 lb 9.6 oz (83.28 kg)  BMI 26.34 kg/m2     Review of Systems  Constitutional: Negative for fever, chills and diaphoresis.  HENT: Negative for sore throat, trouble swallowing and neck pain.   Eyes: Negative for photophobia and visual disturbance.  Respiratory: Negative for choking and shortness of  breath.   Cardiovascular: Negative for chest pain and palpitations.  Gastrointestinal: Negative for nausea, vomiting, abdominal distention, anal bleeding and rectal pain.  Genitourinary: Positive for scrotal swelling. Negative for dysuria, urgency, penile swelling, difficulty urinating, penile pain and testicular pain.  Musculoskeletal: Negative for myalgias, arthralgias and gait problem.  Skin: Negative for color change and rash.  Neurological: Negative for dizziness, speech difficulty, weakness and numbness.  Hematological: Negative for adenopathy.  Psychiatric/Behavioral: Negative for hallucinations, confusion and agitation.       Objective:   Physical Exam  Constitutional: He is oriented to person, place, and time. He appears well-developed and well-nourished. No distress.  HENT:  Head: Normocephalic.  Mouth/Throat: Oropharynx is clear and moist. No oropharyngeal exudate.  Eyes: Conjunctivae and EOM are normal. Pupils are equal, round, and reactive to light. No scleral icterus.  Neck: Normal range of motion. No tracheal deviation present.  Cardiovascular: Normal rate, normal heart sounds and intact distal pulses.   Pulmonary/Chest: Effort normal. No respiratory distress.  Abdominal: Soft. He exhibits no distension. There is no tenderness. Hernia confirmed negative in the right inguinal area and confirmed negative in the left inguinal area.       Incisions clean with normal healing ridges.  I removed his dressings - he still had them on.  No hernias  Genitourinary: Penis normal. No penile tenderness.       Mild scrotal swelling/sensitivity  Musculoskeletal: Normal range of motion. He exhibits no tenderness.  Neurological: He is alert and oriented to person, place, and time. No cranial nerve deficit. He exhibits normal muscle tone. Coordination normal.  Skin: Skin is warm and dry. No rash noted. He is not diaphoretic.  Psychiatric: He has a normal mood and affect. His behavior is  normal.       Assessment:     2 weeks s/p lap BIH repair, recovering well    Plan:     Increase activity as tolerated.  Do not push through pain.  Advanced on diet as tolerated. Bowel regimen to avoid problems.  Return to clinic p.r.n. The patient expressed understanding and appreciation

## 2011-11-29 NOTE — Patient Instructions (Signed)

## 2012-01-03 ENCOUNTER — Encounter (HOSPITAL_BASED_OUTPATIENT_CLINIC_OR_DEPARTMENT_OTHER): Payer: Medicare Other | Attending: Plastic Surgery

## 2012-01-03 DIAGNOSIS — Z7982 Long term (current) use of aspirin: Secondary | ICD-10-CM | POA: Insufficient documentation

## 2012-01-03 DIAGNOSIS — T8189XA Other complications of procedures, not elsewhere classified, initial encounter: Secondary | ICD-10-CM | POA: Insufficient documentation

## 2012-01-03 DIAGNOSIS — Z4889 Encounter for other specified surgical aftercare: Secondary | ICD-10-CM | POA: Insufficient documentation

## 2012-01-03 DIAGNOSIS — Z79899 Other long term (current) drug therapy: Secondary | ICD-10-CM | POA: Insufficient documentation

## 2012-01-03 DIAGNOSIS — I10 Essential (primary) hypertension: Secondary | ICD-10-CM | POA: Insufficient documentation

## 2012-01-03 DIAGNOSIS — Z8614 Personal history of Methicillin resistant Staphylococcus aureus infection: Secondary | ICD-10-CM | POA: Insufficient documentation

## 2012-01-03 DIAGNOSIS — Y838 Other surgical procedures as the cause of abnormal reaction of the patient, or of later complication, without mention of misadventure at the time of the procedure: Secondary | ICD-10-CM | POA: Insufficient documentation

## 2012-01-03 DIAGNOSIS — L84 Corns and callosities: Secondary | ICD-10-CM | POA: Insufficient documentation

## 2012-02-07 ENCOUNTER — Encounter (HOSPITAL_BASED_OUTPATIENT_CLINIC_OR_DEPARTMENT_OTHER): Payer: Medicare Other | Attending: Plastic Surgery

## 2012-02-07 DIAGNOSIS — Z8614 Personal history of Methicillin resistant Staphylococcus aureus infection: Secondary | ICD-10-CM | POA: Insufficient documentation

## 2012-02-07 DIAGNOSIS — I10 Essential (primary) hypertension: Secondary | ICD-10-CM | POA: Insufficient documentation

## 2012-02-07 DIAGNOSIS — Z7982 Long term (current) use of aspirin: Secondary | ICD-10-CM | POA: Insufficient documentation

## 2012-02-07 DIAGNOSIS — Z79899 Other long term (current) drug therapy: Secondary | ICD-10-CM | POA: Insufficient documentation

## 2012-02-07 DIAGNOSIS — Y838 Other surgical procedures as the cause of abnormal reaction of the patient, or of later complication, without mention of misadventure at the time of the procedure: Secondary | ICD-10-CM | POA: Insufficient documentation

## 2012-02-07 DIAGNOSIS — T8189XA Other complications of procedures, not elsewhere classified, initial encounter: Secondary | ICD-10-CM | POA: Insufficient documentation

## 2012-02-07 DIAGNOSIS — L84 Corns and callosities: Secondary | ICD-10-CM | POA: Insufficient documentation

## 2012-04-16 ENCOUNTER — Other Ambulatory Visit: Payer: Self-pay | Admitting: Internal Medicine

## 2012-04-16 DIAGNOSIS — N182 Chronic kidney disease, stage 2 (mild): Secondary | ICD-10-CM

## 2012-04-17 ENCOUNTER — Ambulatory Visit
Admission: RE | Admit: 2012-04-17 | Discharge: 2012-04-17 | Disposition: A | Discharge status: Home / Self Care | Payer: Medicare Other | Source: Ambulatory Visit | Attending: Internal Medicine | Admitting: Internal Medicine

## 2012-04-17 DIAGNOSIS — N182 Chronic kidney disease, stage 2 (mild): Secondary | ICD-10-CM

## 2012-05-13 ENCOUNTER — Telehealth: Payer: Self-pay | Admitting: Cardiovascular Disease

## 2012-05-13 ENCOUNTER — Other Ambulatory Visit: Payer: Self-pay | Admitting: *Deleted

## 2012-05-13 MED ORDER — AMLODIPINE BESYLATE 5 MG PO TABS: 5.0000 mg | ORAL_TABLET | Freq: Every day | ORAL | Status: DC

## 2012-05-13 NOTE — Telephone Encounter (Signed)
[facility] supply, norvasc generic 5mg , [phone]

## 2012-06-10 ENCOUNTER — Encounter (HOSPITAL_BASED_OUTPATIENT_CLINIC_OR_DEPARTMENT_OTHER): Payer: Medicare Other | Attending: Plastic Surgery

## 2012-06-10 DIAGNOSIS — L98499 Non-pressure chronic ulcer of skin of other sites with unspecified severity: Secondary | ICD-10-CM | POA: Insufficient documentation

## 2012-06-10 DIAGNOSIS — Z79899 Other long term (current) drug therapy: Secondary | ICD-10-CM | POA: Insufficient documentation

## 2012-06-10 DIAGNOSIS — Z8614 Personal history of Methicillin resistant Staphylococcus aureus infection: Secondary | ICD-10-CM | POA: Insufficient documentation

## 2012-06-10 DIAGNOSIS — I10 Essential (primary) hypertension: Secondary | ICD-10-CM | POA: Insufficient documentation

## 2012-06-10 NOTE — Progress Notes (Signed)
[facility]  NAME:  Kyle Brewer, Kyle Brewer NO.:  [id number]  MEDICAL RECORD NO.:  [id number]      DATE OF BIRTH:  [birth year]  PHYSICIAN:  Kyle Denis, DO       VISIT DATE:  06/10/2012                                  OFFICE VISIT   Kyle Brewer is known to the clinic.  He is an 78 year old gentleman who is here for new wound on his right elbow.  His original injury was 3 years ago when he had a bursitis of the elbow and went for drainage.  He ended up unfortunately with MRSA, and the wound he was treated with several different kinds of dressings, VAC, and did finally heal.  He is not sure what happened but broke out 2 areas of the skin open in the last 2 weeks.  The surrounding skin is very thin and is over bone because of his original injury.  There has been no change in his medication.  He is taking Toprol, Norvasc, Synthroid, lisinopril, Fosamax, MOBIC, Avodart, Prilosec, Zocor, alendronate and terazosin, multivitamin, aspirin, calcium, vitamin B 12.  No known allergies.  He has hypertension, MRSA from his elbow history, kidney stone history, right elbow bursitis, and 2 hernia repairs in 2012.  REVIEW OF SYSTEMS:  Otherwise negative.  PHYSICAL EXAMINATION:  GENERAL: He is alert, oriented, cooperative.  He is a good historian. EYES: Pupils are equal.  Extraocular muscles are intact.  No cervical lymphadenopathy.  His glasses are in place. LUNGS: His breathing is unlabored. CARDIAC: His heart is regular. ABDOMEN:  Soft. EXTREMITIES: Upper extremity pulses are strong and regular.  The wound is described above and noted in the note.  Recommend Silvercel with gauze, Kerlix netting and further evaluation for flap with Dr. Dawna Part, and we will see him back in 1 week.     Kyle Denis, DO     CS/MEDQ  D:  06/10/2012  T:  06/10/2012  Job:  [id number]

## 2012-06-17 NOTE — Progress Notes (Signed)
[facility]  NAME:  Kyle Brewer, Kyle Brewer NO.:  [id number]  MEDICAL RECORD NO.:  [id number]      DATE OF BIRTH:  [birth year]  PHYSICIAN:  Wayland Denis, DO       VISIT DATE:  06/17/2012                                  OFFICE VISIT   The patient is an 78 year old man who is here for followup on his right elbow.  He had a cyst removed previously and since then has had trouble healing the area.  He underwent surgical repair with very good results for many months until he bumped it and it opened back up.  Due to the surrounding periwound area being so thin, the decision was made that he needs likely a flap.  We have been using Silvercel on the area with some improvement.  There does not appear to be infected.  It is not overly painful.  There is no change in his medications or social history.  PHYSICAL EXAMINATION:  GENERAL:  He is alert, oriented, cooperative, not in any acute distress.  He is very pleasant. EYES:  Pupils equal.  Extraocular muscles are intact. NECK:  No cervical lymphadenopathy. LUNGS:  His breathing is unlabored. HEART:  Regular. ABDOMEN:  Soft.  We will continue with Silvercel and have him follow up with Dr. Dawna Part for possible flap.     Wayland Denis, DO     CS/MEDQ  D:  06/17/2012  T:  06/17/2012  Job:  [id number]

## 2012-06-24 ENCOUNTER — Other Ambulatory Visit: Payer: Self-pay | Admitting: Cardiovascular Disease

## 2012-06-24 NOTE — Telephone Encounter (Signed)
Pt needs refill

## 2012-07-01 ENCOUNTER — Other Ambulatory Visit: Payer: Self-pay | Admitting: Cardiovascular Disease

## 2012-07-01 MED ORDER — METOPROLOL SUCCINATE ER 100 MG PO TB24: 100.0000 mg | ORAL_TABLET | Freq: Every day | ORAL | Status: DC

## 2012-07-01 NOTE — Telephone Encounter (Signed)
Refill- Patient would like to know why he has not received this RX he requested last week.  I did notice there is a line through the Medication on the medlist.  Please return call to patient at [phone] to advise.  Verified [facility], correct.

## 2012-07-08 ENCOUNTER — Encounter (HOSPITAL_BASED_OUTPATIENT_CLINIC_OR_DEPARTMENT_OTHER): Payer: Medicare Other

## 2012-10-17 ENCOUNTER — Telehealth: Payer: Self-pay | Admitting: Oncology

## 2012-10-17 NOTE — Telephone Encounter (Signed)
S/W with Dr. Timothy Lasso NP will see Dr. Gaylyn Rong on 10/25 @ 10:30.  Dx-Leukopenia/Thrombocytopenia

## 2012-10-18 ENCOUNTER — Ambulatory Visit: Payer: Medicare Other

## 2012-10-18 ENCOUNTER — Ambulatory Visit (HOSPITAL_BASED_OUTPATIENT_CLINIC_OR_DEPARTMENT_OTHER): Payer: Medicare Other | Admitting: Oncology

## 2012-10-18 ENCOUNTER — Telehealth: Payer: Self-pay | Admitting: Oncology

## 2012-10-18 ENCOUNTER — Encounter: Payer: Self-pay | Admitting: Oncology

## 2012-10-18 ENCOUNTER — Other Ambulatory Visit (HOSPITAL_BASED_OUTPATIENT_CLINIC_OR_DEPARTMENT_OTHER): Payer: Medicare Other | Admitting: Lab

## 2012-10-18 VITALS — BP 152/65 | HR 63 | Temp 97.2°F | Resp 20 | Ht 70.0 in | Wt 191.3 lb

## 2012-10-18 DIAGNOSIS — Z23 Encounter for immunization: Secondary | ICD-10-CM

## 2012-10-18 DIAGNOSIS — D61818 Other pancytopenia: Secondary | ICD-10-CM

## 2012-10-18 DIAGNOSIS — D539 Nutritional anemia, unspecified: Secondary | ICD-10-CM

## 2012-10-18 DIAGNOSIS — I10 Essential (primary) hypertension: Secondary | ICD-10-CM

## 2012-10-18 DIAGNOSIS — M810 Age-related osteoporosis without current pathological fracture: Secondary | ICD-10-CM

## 2012-10-18 LAB — CBC WITH DIFFERENTIAL/PLATELET
EOS%: 1.4 % (ref 0.0–7.0)
Eosinophils Absolute: 0 10*3/uL (ref 0.0–0.5)
LYMPH%: 64.9 % — ABNORMAL HIGH (ref 14.0–49.0)
MCH: 33.6 pg — ABNORMAL HIGH (ref 27.2–33.4)
MCHC: 33.8 g/dL (ref 32.0–36.0)
MCV: 99.4 fL — ABNORMAL HIGH (ref 79.3–98.0)
MONO%: 4.3 % (ref 0.0–14.0)
Platelets: 65 10*3/uL — ABNORMAL LOW (ref 140–400)
RBC: 3.61 10*6/uL — ABNORMAL LOW (ref 4.20–5.82)
RDW: 14.4 % (ref 11.0–14.6)

## 2012-10-18 LAB — MORPHOLOGY

## 2012-10-18 MED ORDER — INFLUENZA VIRUS VACC SPLIT PF IM SUSP
0.5000 mL | Freq: Once | INTRAMUSCULAR | Status: AC
  Administered 2012-10-18: 0.5 mL via INTRAMUSCULAR
  Filled 2012-10-18: qty 0.5

## 2012-10-18 NOTE — Progress Notes (Signed)
[facility]  Telephone:(336) [phone] [phone]     INITIAL HEMATOLOGY CONSULTATION    Referral MD:  Dr. Gwen Pounds, M.D.   Reason for Referral: pancytopenia.     HPI:  [name] is an 78 year-old man with HTN, HLP, CAD, osteoarthritis.  He has had slight leukopenia and thrombocytopenia the last 1-2 years.  However, his last routine CBC drawn on 10/17/2012 showed WBC 2.3; ANC 0.5; Hgb 11.4; Plt 66K.  Therefore, he was kindly referred to the [facility] for evaluation.   Kyle Brewer presented to the Clinic by himself today for evaluation.  He has mild fatigue.  He still lives by himself and is independent of all activities of daily living including shopping, cooking, cleaning, mowing grass.  He reports normal appetite and without weight loss.  He denied recurrent infection.  He denied any kind of bleeding.  He has bilateral knee pain for the past few years.    Patient denies fever, anorexia, weight loss, headache, visual changes, confusion, drenching night sweats, palpable lymph node swelling, mucositis, odynophagia, dysphagia, nausea vomiting, jaundice, chest pain, palpitation, shortness of breath, dyspnea on exertion, productive cough, gum bleeding, epistaxis, hematemesis, hemoptysis, abdominal pain, abdominal swelling, early satiety, melena, hematochezia, hematuria, skin rash, spontaneous bleeding, joint swelling, joint pain, heat or cold intolerance, bowel bladder incontinence, back pain, focal motor weakness, paresthesia, depression, suicidal or homicidal ideation, feeling hopelessness.   Past Medical History  Diagnosis Date  . Inguinal hernia     right  . Osteoporosis   . Hypothyroid   . Hyperlipidemia   . Arthritis   . BPH (benign prostatic hyperplasia)   . Wound, open, elbow     Right, followed [facility]  . Hypertension   . CAD (coronary artery disease)     3V CABG 1995, relook cath 2001 with patent grafts. normal stress test 2010.   .  Back pain   . CKD (chronic kidney disease), stage I   . Bursitis   . Pancytopenia   . Kidney stone   :    Past Surgical History  Procedure Date  . Appendectomy 1968  . Coronary artery bypass graft 1995  . Total knee arthroplasty 2007    right  . Elbow arthroscopy 2012    right  . Transurethral resection of prostate     Dr. Isabel Caprice  . Hernia repair 11/07/11    lap bilateral inguinal  :   CURRENT MEDS: Current Outpatient Prescriptions  Medication Sig Dispense Refill  . alendronate (FOSAMAX) 70 MG tablet Take 70 mg by mouth every 7 (seven) days. Take with a full glass of water on an empty stomach.       Marland Kitchen amLODipine (NORVASC) 5 MG tablet Take 1 tablet (5 mg total) by mouth daily.  90 tablet  2  . Ascorbic Acid (VITAMIN C PO) Take by mouth daily.       Marland Kitchen aspirin 81 MG tablet Take 81 mg by mouth daily.        Marland Kitchen CALCIUM PO Take 600 mg by mouth 2 (two) times daily. Plus Vit D      . Cyanocobalamin (VITAMIN B12 PO) Take 1,000 mcg by mouth daily.       Marland Kitchen dutasteride (AVODART) 0.5 MG capsule Take 0.5 mg by mouth daily.        Marland Kitchen levothyroxine (SYNTHROID, LEVOTHROID) 50 MCG tablet Take 50 mcg by mouth daily.        Marland Kitchen lisinopril (PRINIVIL,ZESTRIL) 10  MG tablet Take 10 mg by mouth daily.      . meloxicam (MOBIC) 7.5 MG tablet Take 7.5 mg by mouth 2 (two) times daily.       . metoprolol succinate (TOPROL XL) 100 MG 24 hr tablet Take 1 tablet (100 mg total) by mouth daily.  90 tablet  2  . Multiple Vitamin (ONE-A-DAY MENS PO) Take by mouth daily.       . simvastatin (ZOCOR) 20 MG tablet Take 20 mg by mouth at bedtime.        Marland Kitchen terazosin (HYTRIN) 10 MG capsule Take 10 mg by mouth at bedtime.        Marland Kitchen DISCONTD: metoprolol succinate (TOPROL-XL) 100 MG 24 hr tablet Take 100 mg by mouth daily. Take with or immediately following a meal.       Current Facility-Administered Medications  Medication Dose Route Frequency Provider Last Rate Last Dose  . influenza  inactive virus vaccine  (FLUZONE/FLUARIX) injection 0.5 mL  0.5 mL Intramuscular Once Exie Parody, MD   0.5 mL at 10/18/12 1204      Allergies  Allergen Reactions  . Sulfamethoxazole W-Trimethoprim     REACTION: Reaction not known  :  Family History  Problem Relation Age of Onset  . Cancer Father     lung  . Heart failure Mother   . Cancer Brother     lung   :  History   Social History  . Marital Status: Widowed    Spouse Name: N/A    Number of Children: 3  . Years of Education: N/A   Occupational History  .      retired post office   Social History Main Topics  . Smoking status: Former Smoker -- 0.5 packs/day for 10 years    Quit date: 12/26/1963  . Smokeless tobacco: Never Used   Comment: quit 1965  . Alcohol Use: No  . Drug Use: No  . Sexually Active: Not on file   Other Topics Concern  . Not on file   Social History Narrative  . No narrative on file  :  REVIEW OF SYSTEM:  The rest of the 14-point review of sytem was negative.   Exam: ECOG 1  General:  well-nourished man, in no acute distress.  Eyes:  no scleral icterus.  ENT:  There were no oropharyngeal lesions.  Neck was without thyromegaly.  Lymphatics:  Negative cervical, supraclavicular or axillary adenopathy.  Respiratory: lungs were clear bilaterally without wheezing or crackles.  Cardiovascular:  Regular rate and rhythm, S1/S2, without murmur, rub or gallop.  There was no pedal edema.  GI:  abdomen was soft, flat, nontender, nondistended, without organomegaly.  Muscoloskeletal:  no spinal tenderness of palpation of vertebral spine.  Skin exam was without echymosis, petichae.  Neuro exam was nonfocal.  Patient was able to get on and off exam table without assistance.  Gait was normal.  Patient was alerted and oriented.  Attention was good.   Language was appropriate.  Mood was normal without depression.  Speech was not pressured.  Thought content was not tangential.    LABS:  Lab Results  Component Value Date   WBC 1.6*  10/18/2012   HGB 12.1* 10/18/2012   HCT 35.9* 10/18/2012   PLT 65 Large platelets present* 10/18/2012   GLUCOSE 87 12/23/2010   NA 140 12/23/2010   K 4.6 12/23/2010   CL 110 12/23/2010   CREATININE 1.40 12/23/2010   BUN 18 12/23/2010   CO2  27 12/23/2010    Blood smear review:   I personally reviewed the patient's peripheral blood smear today.  There was isocytosis.  There was no peripheral blast.  There was no schistocytosis, spherocytosis, target cell, rouleaux formation, tear drop cell.  There was slight hypogranulation in the granulocytes.  There was no giant platelets or platelet clumps.      ASSESSMENT AND PLAN:   1.  Hypertension:  He is on amlodipine, lisinopril, metoprolol. 2.  Hyperlipidemia:  He is on simvastatin. 3.  CAD:  He is on ASA, simvastatin, lisinopril, and metoprolol. 4.  Hypothyroidism:  He is on Synthroid. 5.  BPH:  He is on dutasteride and terazosin. 6.  Osteoporosis:  He is on Fosamax. 7.  Pancytopenia:  Progressively worsened for the past 1-2 year(s).   -Potential causes:  Vitamin B12 deficiency, hypothyroid,  bone marrow failure (such as myelodysplastic syndrome MDS).  Untreated MDS can progress to severe low blood count (fatigue, bleeding, infection) and acute leukemia.  -Work up:    *  Lab tests  *  Bone marrow biopsy.  Patient expressed informed understanding and agreed to proceed with bone marrow biopsy on Mon 10/228/13.    *  I discussed with his oldest son on the phone and updated him on the work up.   - Treatment:  Depending on work up.  If MDS, light chemotherapy are available to control the disease.      Thank you for this referral.     The length of time of the face-to-face encounter was 45 minutes. More than 50% of time was spent counseling and coordination of care.

## 2012-10-18 NOTE — Telephone Encounter (Signed)
Printed and gv pt appt schedule for OCT and NOV °

## 2012-10-18 NOTE — Patient Instructions (Addendum)
1.  Issue:  Pancytopenia (low white blood cell, anemia, and low platelet count).  2.  Potential causes:  Vitamin B12 deficiency, hypothyroid,  bone marrow failure (such as myelodysplastic syndrome MDS).  Untreated MDS can progress to severe low blood count (fatigue, bleeding, infection) and acute leukemia.   3.  Work up:    *  Lab tests  *  Bone marrow biopsy.  4.  Treatment:  Depending on work up.  If MDS, light chemotherapy are available to control the disease.

## 2012-10-21 ENCOUNTER — Ambulatory Visit (HOSPITAL_BASED_OUTPATIENT_CLINIC_OR_DEPARTMENT_OTHER): Payer: Medicare Other | Admitting: Oncology

## 2012-10-21 ENCOUNTER — Other Ambulatory Visit: Payer: Medicare Other | Admitting: Lab

## 2012-10-21 ENCOUNTER — Other Ambulatory Visit (HOSPITAL_COMMUNITY)
Admission: RE | Admit: 2012-10-21 | Discharge: 2012-10-21 | Disposition: A | Discharge status: Home / Self Care | Payer: Medicare Other | Source: Ambulatory Visit | Attending: Oncology | Admitting: Oncology

## 2012-10-21 VITALS — BP 138/75 | HR 62 | Temp 97.9°F

## 2012-10-21 DIAGNOSIS — D61818 Other pancytopenia: Secondary | ICD-10-CM | POA: Insufficient documentation

## 2012-10-21 NOTE — Progress Notes (Signed)
Patient arrived at 0730.  VS obtained, WNL.  Dr. Gaylyn Rong and Claris Che from [facility] present for BMBX.  Patient tolerated well.  Currently laying supine and provided with nourishments.  Okay to be discharged at 0900 if all is WNL.

## 2012-10-21 NOTE — Progress Notes (Signed)
Pleas see bone marrow biopsy note dated same day 10/21/12.

## 2012-10-21 NOTE — Procedures (Signed)
   [facility]  Telephone:(336) [phone] Fax:(336) [phone]   BONE MARROW BIOPSY AND ASPIRATION   INDICATION:  Progressive pancytopenia.   Procedure: After obtained from consent, Kyle Brewer was placed in the prone position. Time out was performed verifying correct patient and procedure. The skin overlying the left posterior crest was prepped with Betadine and draped in the usual sterile fashion. The skin and periosteum were infiltrated with 20 mL of 2% lidocaine. A small puncture wound was made with #11 scalpel blade.  Bone marrow aspirate was obtained on the first pass of the aspiration needle.  One core biopsy was obtained through the same incision.   The aspirate was sent for routine histology, flow cytometry, and cytogenetics.  Core biopsy was sent for routine histology.   Kyle Brewer tolerated procedure well with minimal  blood loss and without immediate complication.   A sterile dressing was applied.   Jethro Bolus M.D. 10/21/2012

## 2012-10-23 LAB — VITAMIN B12: Vitamin B-12: 562 pg/mL (ref 211–911)

## 2012-10-24 ENCOUNTER — Telehealth: Payer: Self-pay | Admitting: Oncology

## 2012-10-24 ENCOUNTER — Other Ambulatory Visit: Payer: Self-pay | Admitting: Oncology

## 2012-10-24 DIAGNOSIS — D46Z Other myelodysplastic syndromes: Secondary | ICD-10-CM

## 2012-10-24 NOTE — Telephone Encounter (Signed)
lvm for pt regarding 11.6.13 appt

## 2012-10-29 NOTE — Patient Instructions (Addendum)
1.  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  Prognosis:  Poor.  Without treatment, the bone marrow will fail.  There will be problems with recurrent infection, bleeding, fatigue.  3.  Treatment options:  *  Induction chemo therapy just like traditional AML:  Not a good options in patients in the 80's due to high risk of complications and low chance of chance of cure of less than 10%.  *  Light chemo:  Vidaza subcutaneous injection days 1-7 of every 28 days.  Goal:  To decrease risk of progression of AML and decrease the need for blood transfusion.  This option can cause lower blood count temporarily for 3 months before they get better.  However, compared to traditional chemo for AML, Vidaza is much better tolerated and much less chance of side effects.   *  If you decided not to do any of these:  We can also continue to monitor your blood count and transfuse as needed.  *  Hospice enrollment?  4.  If you decide to pursue chemo, we need to get ready:  *  Chemo class.  *  Placement of portacath for long term IV access by Interventional Radiology.  5.  Second opinion at [place], or [facility]?

## 2012-10-30 ENCOUNTER — Ambulatory Visit (HOSPITAL_BASED_OUTPATIENT_CLINIC_OR_DEPARTMENT_OTHER): Payer: Medicare Other | Admitting: Oncology

## 2012-10-30 ENCOUNTER — Other Ambulatory Visit (HOSPITAL_BASED_OUTPATIENT_CLINIC_OR_DEPARTMENT_OTHER): Payer: Medicare Other | Admitting: Lab

## 2012-10-30 ENCOUNTER — Telehealth: Payer: Self-pay | Admitting: Oncology

## 2012-10-30 ENCOUNTER — Encounter: Payer: Self-pay | Admitting: Oncology

## 2012-10-30 VITALS — BP 134/73 | HR 66 | Temp 97.1°F | Resp 20 | Ht 70.0 in | Wt 191.5 lb

## 2012-10-30 DIAGNOSIS — C9200 Acute myeloblastic leukemia, not having achieved remission: Secondary | ICD-10-CM

## 2012-10-30 DIAGNOSIS — I10 Essential (primary) hypertension: Secondary | ICD-10-CM

## 2012-10-30 DIAGNOSIS — M810 Age-related osteoporosis without current pathological fracture: Secondary | ICD-10-CM

## 2012-10-30 DIAGNOSIS — D46Z Other myelodysplastic syndromes: Secondary | ICD-10-CM

## 2012-10-30 DIAGNOSIS — D61818 Other pancytopenia: Secondary | ICD-10-CM

## 2012-10-30 HISTORY — DX: Acute myeloblastic leukemia, not having achieved remission: C92.00

## 2012-10-30 LAB — CBC WITH DIFFERENTIAL/PLATELET
BASO%: 0.4 % (ref 0.0–2.0)
Basophils Absolute: 0 10*3/uL (ref 0.0–0.1)
EOS%: 1.2 % (ref 0.0–7.0)
HCT: 35.7 % — ABNORMAL LOW (ref 38.4–49.9)
HGB: 12 g/dL — ABNORMAL LOW (ref 13.0–17.1)
MCH: 33.8 pg — ABNORMAL HIGH (ref 27.2–33.4)
MCHC: 33.6 g/dL (ref 32.0–36.0)
MCV: 100.6 fL — ABNORMAL HIGH (ref 79.3–98.0)
MONO%: 4.1 % (ref 0.0–14.0)
NEUT%: 21.8 % — ABNORMAL LOW (ref 39.0–75.0)

## 2012-10-30 LAB — COMPREHENSIVE METABOLIC PANEL (CC13)
AST: 14 U/L (ref 5–34)
Albumin: 3.8 g/dL (ref 3.5–5.0)
Alkaline Phosphatase: 57 U/L (ref 40–150)
BUN: 24 mg/dL (ref 7.0–26.0)
Potassium: 4.2 mEq/L (ref 3.5–5.1)
Total Bilirubin: 0.61 mg/dL (ref 0.20–1.20)

## 2012-10-30 NOTE — Progress Notes (Signed)
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   PAST THERAPY:  Biopsy only   CURRENT THERAPY:  Here to discuss treatment today.   INTERVAL HISTORY: Kyle Brewer 78 y.o. male returns for regular follow up with his oldest son [name].  He reports feeling well.  He has chronic bilateral knee pain for years.  He still lives by himself and is able to cook/clean/grocery cook.   Patient denies fever, anorexia, weight loss, fatigue, headache, visual changes, confusion, drenching night sweats, palpable lymph node swelling, mucositis, odynophagia, dysphagia, nausea vomiting, jaundice, chest pain, palpitation, shortness of breath, dyspnea on exertion, productive cough, gum bleeding, epistaxis, hematemesis, hemoptysis, abdominal pain, abdominal swelling, early satiety, melena, hematochezia, hematuria, skin rash, spontaneous bleeding, heat or cold intolerance, bowel bladder incontinence, back pain, focal motor weakness, paresthesia, depression, suicidal or homicidal ideation, feeling hopelessness.   Past Medical History  Diagnosis Date  . Inguinal hernia     right  . Osteoporosis   . Hypothyroid   . Hyperlipidemia   . Arthritis   . BPH (benign prostatic hyperplasia)   . Wound, open, elbow     Right, followed [facility]  . Hypertension   . CAD (coronary artery disease)     3V CABG 1995, relook cath 2001 with patent grafts. normal stress test 2010.   . Back pain   . CKD (chronic kidney disease), stage I   . Bursitis   . Pancytopenia   . Kidney stone   . 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 MDS (myelodysplastic syndrome), high grade     Past Surgical History  Procedure Date  . Appendectomy 1968  . Coronary artery bypass graft 1995  . Total knee arthroplasty 2007    right  . Elbow arthroscopy 2012     right  . Transurethral resection of prostate     Dr. Isabel Brewer  . Hernia repair 11/07/11    lap bilateral inguinal    Current Outpatient Prescriptions  Medication Sig Dispense Refill  . alendronate (FOSAMAX) 70 MG tablet Take 70 mg by mouth every 7 (seven) days. Take with a full glass of water on an empty stomach.       Marland Kitchen amLODipine (NORVASC) 5 MG tablet Take 1 tablet (5 mg total) by mouth daily.  90 tablet  2  . Ascorbic Acid (VITAMIN C PO) Take by mouth daily.       Marland Kitchen aspirin 81 MG tablet Take 81 mg by mouth daily.        Marland Kitchen CALCIUM PO Take 600 mg by mouth 2 (two) times daily. Plus Vit D      . Cyanocobalamin (VITAMIN B12 PO) Take 1,000 mcg by mouth daily.       Marland Kitchen dutasteride (AVODART) 0.5 MG capsule Take 0.5 mg by mouth daily.        Marland Kitchen levothyroxine (SYNTHROID, LEVOTHROID) 50 MCG tablet Take 50 mcg by mouth daily.        Marland Kitchen lisinopril (PRINIVIL,ZESTRIL) 10 MG tablet Take 10 mg by mouth daily.      Marland Kitchen LORazepam (ATIVAN) 0.5 MG tablet Take 0.5 mg by mouth at bedtime as needed.      . meloxicam (MOBIC) 7.5 MG tablet Take 7.5 mg by mouth 2 (two) times daily.       . metoprolol succinate (TOPROL XL)  100 MG 24 hr tablet Take 1 tablet (100 mg total) by mouth daily.  90 tablet  2  . Multiple Vitamin (ONE-A-DAY MENS PO) Take by mouth daily.       . simvastatin (ZOCOR) 20 MG tablet Take 20 mg by mouth at bedtime.        Marland Kitchen terazosin (HYTRIN) 10 MG capsule Take 10 mg by mouth at bedtime.          ALLERGIES:   has no known allergies.  REVIEW OF SYSTEMS:  The rest of the 14-point review of system was negative.   Filed Vitals:   10/30/12 1254  BP: 134/73  Pulse: 66  Temp: 97.1 F (36.2 C)  Resp: 20   Wt Readings from Last 3 Encounters:  10/30/12 191 lb 8 oz (86.864 kg)  10/18/12 191 lb 4.8 oz (86.773 kg)  11/29/11 183 lb 9.6 oz (83.28 kg)   ECOG Performance status: 1  PHYSICAL EXAMINATION:   General:  well-nourished man, in no acute distress.  Eyes:  no scleral icterus.  ENT:  There  were no oropharyngeal lesions.  Neck was without thyromegaly.  Lymphatics:  Negative cervical, supraclavicular or axillary adenopathy.  Respiratory: lungs were clear bilaterally without wheezing or crackles.  Cardiovascular:  Regular rate and rhythm, S1/S2, without murmur, rub or gallop.  There was no pedal edema.  GI:  abdomen was soft, flat, nontender, nondistended, without organomegaly.  Muscoloskeletal:  no spinal tenderness of palpation of vertebral spine.  Skin exam was without echymosis, petichae.  Neuro exam was nonfocal.  Patient was able to get on and off exam table without assistance.  Gait was normal.  Patient was alerted and oriented.  Attention was good.   Language was appropriate.  Mood was normal without depression.  Speech was not pressured.  Thought content was not tangential.     LABORATORY/RADIOLOGY DATA:  Lab Results  Component Value Date   WBC 2.1* 10/30/2012   HGB 12.0* 10/30/2012   HCT 35.7* 10/30/2012   PLT 59* 10/30/2012   GLUCOSE 91 10/30/2012   ALKPHOS 57 10/30/2012   ALT 14 10/30/2012   AST 14 10/30/2012   NA 142 10/30/2012   K 4.2 10/30/2012   CL 111* 10/30/2012   CREATININE 1.6* 10/30/2012   BUN 24.0 10/30/2012   CO2 26 10/30/2012      ASSESSMENT AND PLAN:   1. Hypertension: He is on amlodipine, lisinopril, metoprolol.  2. Hyperlipidemia: He is on simvastatin.  3. CAD: He is on ASA, simvastatin, lisinopril, and metoprolol.  4. Hypothyroidism: He is on Synthroid.  5. BPH: He is on dutasteride and terazosin.  6. Osteoporosis: He is on Fosamax.  7. Pancytopenia: Progressively worsened for the past 1-2 year(s).   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  Without treatment, the bone marrow will fail.  There will be problems with recurrent infection, bleeding, fatigue.  -Treatment options:  *  Induction chemo therapy just like traditional AML:  Not a good options in patients in the 80's due to high  risk of complications and low chance of chance of cure of less than 10%.  *  Light chemo:  Vidaza subcutaneous injection days 1-7 of every 28 days.  Goal:  To decrease risk of progression of AML and decrease the need for blood transfusion.  This option can cause lower blood count temporarily for 3 months before they get better.  However, compared to traditional chemo for AML,  Vidaza is much better tolerated and much less chance of side effects.   *  If you decided not to do any of these:  We can also continue to monitor your blood count and transfuse as needed.  *  I'm looking to see if he is candidate for SWOG [phone] trial for high risk MDS here.  This trial looks at [facility] +/- Revlimid and Vorinostat.   *  Hospice enrollment?  - He and his son expressed informed understanding and leaned toward trying Vidaza.  They agreed to 2nd opinion at [facility] to confirm our diagnosis and treatment here.  I arranged for the following:   *  Chemo class.  *  Placement of portacath for long term IV access by Interventional Radiology. 8 . Code status:  I discussed the poor prognosis with MDS and transformed AML.  He is leaning toward DNR/DNI; however, he needs time to discuss with the rest of his family.   9.  Follow up: on 11/11/2012 to go over plan from [place] and potentially to start Vidaza here.       The length of time of the face-to-face encounter was 40 minutes. More than 50% of time was spent counseling and coordination of care.

## 2012-10-30 NOTE — Telephone Encounter (Signed)
gv pt appt schedule for November and December. S/w Tina in IR and pt scheduled for port placement 11/14 to arrive @ 12 pm. Inetta Fermo will call to confirm d/t/instructions. Pt/son aware. Also Tiffany in HIM aware of [facility] referral.

## 2012-10-31 ENCOUNTER — Telehealth: Payer: Self-pay | Admitting: Oncology

## 2012-10-31 NOTE — Telephone Encounter (Signed)
Pt appt with Dr. Lowell Guitar @ [facility] is 11/07/12 @ 1:00. Medical records faxed,scans will be fedex'ed. Pt is aware.

## 2012-11-01 ENCOUNTER — Other Ambulatory Visit: Payer: Self-pay | Admitting: Radiology

## 2012-11-01 ENCOUNTER — Telehealth (HOSPITAL_COMMUNITY): Payer: Self-pay | Admitting: Radiology

## 2012-11-01 ENCOUNTER — Telehealth: Payer: Self-pay | Admitting: Oncology

## 2012-11-01 NOTE — Telephone Encounter (Signed)
Called patient to move Portacath insertion from 11-07-12 to 11-04-12.  Patient to report to Radiology after Chemo Class.  Patient reminded to be npo after 6:00 am and to have a driver.

## 2012-11-01 NOTE — Telephone Encounter (Signed)
Pt appt. With Dr. Peter Congo is 11/07/12 @ 1:00. Medical records faxed,slides will be fedex'ed. Pt is aware.

## 2012-11-04 ENCOUNTER — Other Ambulatory Visit: Payer: Medicare Other

## 2012-11-04 ENCOUNTER — Ambulatory Visit (HOSPITAL_COMMUNITY)
Admission: RE | Admit: 2012-11-04 | Discharge: 2012-11-04 | Disposition: A | Discharge status: Home / Self Care | Payer: Medicare Other | Source: Ambulatory Visit | Attending: Oncology | Admitting: Oncology

## 2012-11-04 ENCOUNTER — Ambulatory Visit (HOSPITAL_COMMUNITY): Admission: RE | Admit: 2012-11-04 | Payer: Medicare Other | Source: Ambulatory Visit

## 2012-11-04 ENCOUNTER — Other Ambulatory Visit: Payer: Self-pay | Admitting: Oncology

## 2012-11-04 VITALS — BP 158/75 | HR 62 | Temp 98.1°F | Resp 17

## 2012-11-04 DIAGNOSIS — C9200 Acute myeloblastic leukemia, not having achieved remission: Secondary | ICD-10-CM | POA: Insufficient documentation

## 2012-11-04 DIAGNOSIS — Z5189 Encounter for other specified aftercare: Secondary | ICD-10-CM | POA: Insufficient documentation

## 2012-11-04 LAB — CBC
MCH: 33.1 pg (ref 26.0–34.0)
MCHC: 34.8 g/dL (ref 30.0–36.0)
MCV: 95.2 fL (ref 78.0–100.0)
Platelets: 57 10*3/uL — ABNORMAL LOW (ref 150–400)
RDW: 14.2 % (ref 11.5–15.5)

## 2012-11-04 LAB — TYPE AND SCREEN
ABO/RH(D): O POS
Antibody Screen: NEGATIVE

## 2012-11-04 LAB — BASIC METABOLIC PANEL
CO2: 26 mEq/L (ref 19–32)
Calcium: 9.9 mg/dL (ref 8.4–10.5)
Creatinine, Ser: 1.56 mg/dL — ABNORMAL HIGH (ref 0.50–1.35)
Glucose, Bld: 85 mg/dL (ref 70–99)

## 2012-11-04 LAB — APTT: aPTT: 31 seconds (ref 24–37)

## 2012-11-04 MED ORDER — SODIUM CHLORIDE 0.9 % IV SOLN: Freq: Once | INTRAVENOUS | Status: DC

## 2012-11-04 MED ORDER — FENTANYL CITRATE 0.05 MG/ML IJ SOLN
INTRAMUSCULAR | Status: AC | PRN
  Administered 2012-11-04 (×2): 50 ug via INTRAVENOUS

## 2012-11-04 MED ORDER — FENTANYL CITRATE 0.05 MG/ML IJ SOLN
INTRAMUSCULAR | Status: AC
  Filled 2012-11-04: qty 4

## 2012-11-04 MED ORDER — CEFAZOLIN SODIUM-DEXTROSE 2-3 GM-% IV SOLR
2.0000 g | Freq: Once | INTRAVENOUS | Status: AC
  Administered 2012-11-04: 2 g via INTRAVENOUS
  Filled 2012-11-04: qty 50

## 2012-11-04 MED ORDER — MIDAZOLAM HCL 2 MG/2ML IJ SOLN
INTRAMUSCULAR | Status: AC | PRN
  Administered 2012-11-04 (×2): 1 mg via INTRAVENOUS

## 2012-11-04 MED ORDER — MIDAZOLAM HCL 2 MG/2ML IJ SOLN
INTRAMUSCULAR | Status: AC
  Filled 2012-11-04: qty 4

## 2012-11-04 MED ORDER — HEPARIN SOD (PORK) LOCK FLUSH 100 UNIT/ML IV SOLN
INTRAVENOUS | Status: AC | PRN
  Administered 2012-11-04: 500 [IU]

## 2012-11-04 MED ORDER — LIDOCAINE HCL 1 % IJ SOLN
INTRAMUSCULAR | Status: AC
  Filled 2012-11-04: qty 20

## 2012-11-04 NOTE — H&P (Signed)
Kyle Brewer is an 78 y.o. male.   Chief Complaint: "I'm here for a port a cath" HPI: Patient with history of AML presents today for port a cath placement for chemotherapy.  Past Medical History  Diagnosis Date  . Inguinal hernia     right  . Osteoporosis   . Hypothyroid   . Hyperlipidemia   . Arthritis   . BPH (benign prostatic hyperplasia)   . Wound, open, elbow     Right, followed [facility]  . Hypertension   . CAD (coronary artery disease)     3V CABG 1995, relook cath 2001 with patent grafts. normal stress test 2010.   . Back pain   . CKD (chronic kidney disease), stage I   . Bursitis   . Pancytopenia   . Kidney stone   . 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 MDS (myelodysplastic syndrome), high grade     Past Surgical History  Procedure Date  . Appendectomy 1968  . Coronary artery bypass graft 1995  . Total knee arthroplasty 2007    right  . Elbow arthroscopy 2012    right  . Transurethral resection of prostate     Dr. Isabel Caprice  . Hernia repair 11/07/11    lap bilateral inguinal    Family History  Problem Relation Age of Onset  . Cancer Father     lung  . Heart failure Mother   . Cancer Brother     lung    Social History:  reports that he quit smoking about 48 years ago. He has never used smokeless tobacco. He reports that he does not drink alcohol or use illicit drugs.  Allergies: No Known Allergies  Current outpatient prescriptions:alendronate (FOSAMAX) 70 MG tablet, Take 70 mg by mouth every 7 (seven) days. Take with a full glass of water on an empty stomach. , Disp: , Rfl: ;  amLODipine (NORVASC) 5 MG tablet, Take 1 tablet (5 mg total) by mouth daily., Disp: 90 tablet, Rfl: 2;  Ascorbic Acid (VITAMIN C PO), Take by mouth daily. , Disp: , Rfl: ;  aspirin 81 MG tablet, Take 81 mg by mouth daily.  , Disp: , Rfl:  CALCIUM PO, Take 600 mg by mouth 2 (two) times daily. Plus Vit D, Disp: , Rfl: ;  Cyanocobalamin (VITAMIN B12 PO), Take 1,000 mcg by mouth  daily. , Disp: , Rfl: ;  dutasteride (AVODART) 0.5 MG capsule, Take 0.5 mg by mouth daily.  , Disp: , Rfl: ;  levothyroxine (SYNTHROID, LEVOTHROID) 50 MCG tablet, Take 50 mcg by mouth daily.  , Disp: , Rfl: ;  lisinopril (PRINIVIL,ZESTRIL) 10 MG tablet, Take 10 mg by mouth daily., Disp: , Rfl:  LORazepam (ATIVAN) 0.5 MG tablet, Take 0.5 mg by mouth at bedtime as needed., Disp: , Rfl: ;  meloxicam (MOBIC) 7.5 MG tablet, Take 7.5 mg by mouth 2 (two) times daily. , Disp: , Rfl: ;  metoprolol succinate (TOPROL XL) 100 MG 24 hr tablet, Take 1 tablet (100 mg total) by mouth daily., Disp: 90 tablet, Rfl: 2;  Multiple Vitamin (ONE-A-DAY MENS PO), Take by mouth daily. , Disp: , Rfl:  simvastatin (ZOCOR) 20 MG tablet, Take 20 mg by mouth at bedtime.  , Disp: , Rfl: ;  terazosin (HYTRIN) 10 MG capsule, Take 10 mg by mouth at bedtime.  , Disp: , Rfl:  Current facility-administered medications:0.9 %  sodium chloride infusion, , Intravenous, Once, Robet Leu, PA;  ceFAZolin (ANCEF) IVPB 2 g/50 mL premix,  2 g, Intravenous, Once, Robet Leu, PA Facility-Administered Medications Ordered in Other Encounters: fentaNYL (SUBLIMAZE) 0.05 MG/ML injection, , , , ;  midazolam (VERSED) 2 MG/2ML injection, , , ,    Results for orders placed during the hospital encounter of 11/04/12 (from the past 48 hour(s))  APTT     Status: Normal   Collection Time   11/04/12 12:01 PM      Component Value Range Comment   aPTT 31  24 - 37 seconds   BASIC METABOLIC PANEL     Status: Abnormal   Collection Time   11/04/12 12:01 PM      Component Value Range Comment   Sodium 140  135 - 145 mEq/L    Potassium 4.0  3.5 - 5.1 mEq/L    Chloride 107  96 - 112 mEq/L    CO2 26  19 - 32 mEq/L    Glucose, Bld 85  70 - 99 mg/dL    BUN 26 (*) 6 - 23 mg/dL    Creatinine, Ser 7.82 (*) 0.50 - 1.35 mg/dL    Calcium 9.9  8.4 - 95.6 mg/dL    GFR calc non Af Amer 39 (*) >90 mL/min    GFR calc Af Amer 46 (*) >90 mL/min   CBC     Status:  Abnormal   Collection Time   11/04/12 12:01 PM      Component Value Range Comment   WBC 1.3 (*) 4.0 - 10.5 K/uL    RBC 3.32 (*) 4.22 - 5.81 MIL/uL    Hemoglobin 11.0 (*) 13.0 - 17.0 g/dL    HCT 21.3 (*) 08.6 - 52.0 %    MCV 95.2  78.0 - 100.0 fL    MCH 33.1  26.0 - 34.0 pg    MCHC 34.8  30.0 - 36.0 g/dL    RDW 57.8  46.9 - 62.9 %    Platelets 57 (*) 150 - 400 K/uL   PROTIME-INR     Status: Normal   Collection Time   11/04/12 12:01 PM      Component Value Range Comment   Prothrombin Time 13.7  11.6 - 15.2 seconds    INR 1.06  0.00 - 1.49    No results found.  Review of Systems  Constitutional: Negative for fever and chills.  Respiratory: Positive for shortness of breath.        Occ cough  Cardiovascular: Negative for chest pain.  Gastrointestinal: Negative for nausea, vomiting and abdominal pain.  Musculoskeletal: Positive for back pain and joint pain.  Neurological: Negative for headaches.    Blood pressure 134/75, pulse 62, temperature 98.2 F (36.8 C), temperature source Oral, resp. rate 18, SpO2 96.00%. Physical Exam  Constitutional: He is oriented to person, place, and time. He appears well-developed and well-nourished.  Cardiovascular: Normal rate and regular rhythm.   Respiratory: Effort normal and breath sounds normal.  GI: Soft. Bowel sounds are normal.  Musculoskeletal: Normal range of motion. He exhibits no edema.  Neurological: He is alert and oriented to person, place, and time.     Assessment/Plan Patient with AML. Plan is for placement of port a cath today for chemotherapy. Due to  thrombocytopenia, pt will receive 1 unit platelets preprocedure . He will also be placed on po keflex postprocedure for 5 days due to marked neutropenia.   Kyle Brewer 11/04/2012, 1:11 PM

## 2012-11-04 NOTE — ED Notes (Signed)
Discharge instructions covered and all questions answered with pt and family. Pt in wc and pushed to lobby and was assisted to car. Pt in nad and free of any complaints at time of discharge. Pt was given script for keflex and is aware heb needs to start taking meds in the am

## 2012-11-04 NOTE — H&P (Signed)
Agree 

## 2012-11-04 NOTE — Procedures (Signed)
Procedure:  Right IJ single lumen port-a-cath placement Findings:  Catheter tip at cavoatrial junction.  No PTX.  Ready to use.

## 2012-11-05 ENCOUNTER — Telehealth: Payer: Self-pay | Admitting: *Deleted

## 2012-11-05 LAB — PREPARE PLATELET PHERESIS

## 2012-11-05 NOTE — Telephone Encounter (Signed)
PT.'S SON WILL CALL TO CANCEL HIS FATHER APPOINTMENT AT [facility]. THIS NOTE TO DR.HA'S ACTIVE WORK FOLDER.

## 2012-11-07 ENCOUNTER — Other Ambulatory Visit (HOSPITAL_COMMUNITY): Payer: Medicare Other

## 2012-11-11 ENCOUNTER — Encounter: Payer: Self-pay | Admitting: Oncology

## 2012-11-11 ENCOUNTER — Ambulatory Visit: Payer: Medicare Other | Admitting: Oncology

## 2012-11-11 ENCOUNTER — Other Ambulatory Visit: Payer: Medicare Other | Admitting: Lab

## 2012-11-11 ENCOUNTER — Ambulatory Visit (HOSPITAL_BASED_OUTPATIENT_CLINIC_OR_DEPARTMENT_OTHER): Payer: Medicare Other | Admitting: Oncology

## 2012-11-11 ENCOUNTER — Ambulatory Visit (HOSPITAL_BASED_OUTPATIENT_CLINIC_OR_DEPARTMENT_OTHER): Payer: Medicare Other

## 2012-11-11 ENCOUNTER — Other Ambulatory Visit (HOSPITAL_BASED_OUTPATIENT_CLINIC_OR_DEPARTMENT_OTHER): Payer: Medicare Other | Admitting: Lab

## 2012-11-11 VITALS — BP 125/65 | HR 65 | Temp 97.6°F | Resp 18 | Ht 70.0 in | Wt 191.8 lb

## 2012-11-11 DIAGNOSIS — C9200 Acute myeloblastic leukemia, not having achieved remission: Secondary | ICD-10-CM

## 2012-11-11 DIAGNOSIS — M810 Age-related osteoporosis without current pathological fracture: Secondary | ICD-10-CM

## 2012-11-11 DIAGNOSIS — D46Z Other myelodysplastic syndromes: Secondary | ICD-10-CM

## 2012-11-11 DIAGNOSIS — R0602 Shortness of breath: Secondary | ICD-10-CM

## 2012-11-11 DIAGNOSIS — D61818 Other pancytopenia: Secondary | ICD-10-CM

## 2012-11-11 DIAGNOSIS — D469 Myelodysplastic syndrome, unspecified: Secondary | ICD-10-CM

## 2012-11-11 DIAGNOSIS — Z5111 Encounter for antineoplastic chemotherapy: Secondary | ICD-10-CM

## 2012-11-11 LAB — CBC WITH DIFFERENTIAL/PLATELET
BASO%: 0 % (ref 0.0–2.0)
LYMPH%: 75.7 % — ABNORMAL HIGH (ref 14.0–49.0)
MCHC: 33.8 g/dL (ref 32.0–36.0)
MONO#: 0.1 10*3/uL (ref 0.1–0.9)
Platelets: 51 10*3/uL — ABNORMAL LOW (ref 140–400)
RBC: 3.24 10*6/uL — ABNORMAL LOW (ref 4.20–5.82)
WBC: 1.8 10*3/uL — ABNORMAL LOW (ref 4.0–10.3)
lymph#: 1.4 10*3/uL (ref 0.9–3.3)

## 2012-11-11 LAB — COMPREHENSIVE METABOLIC PANEL (CC13)
ALT: 18 U/L (ref 0–55)
CO2: 26 mEq/L (ref 22–29)
Sodium: 142 mEq/L (ref 136–145)
Total Bilirubin: 0.59 mg/dL (ref 0.20–1.20)
Total Protein: 6.4 g/dL (ref 6.4–8.3)

## 2012-11-11 MED ORDER — AZACITIDINE CHEMO SQ INJECTION
75.0000 mg/m2 | Freq: Once | INTRAMUSCULAR | Status: AC
  Administered 2012-11-11: 150 mg via SUBCUTANEOUS
  Filled 2012-11-11: qty 30

## 2012-11-11 MED ORDER — ONDANSETRON HCL 8 MG PO TABS
8.0000 mg | ORAL_TABLET | Freq: Once | ORAL | Status: AC
  Administered 2012-11-11: 8 mg via ORAL

## 2012-11-11 NOTE — Patient Instructions (Addendum)
1.  Diagnosis:  Transformed MDS to AML (acute myeloid leukemia). 2.  Treatment:  Vidaza SQ d1-5, and d8-9 of every 4 weeks.  3.  Chance of response:  About 50% in normalizing blood count and controlling leukemia. 4.  Follow up:  Once a week for now until disease stable.

## 2012-11-11 NOTE — Patient Instructions (Signed)
[facility] Discharge Instructions for Patients Receiving Chemotherapy  Today you received the following chemotherapy agents Vidaza  To help prevent nausea and vomiting after your treatment, we encourage you to take your nausea medication as prescribed.    If you develop nausea and vomiting that is not controlled by your nausea medication, call the clinic. If it is after clinic hours your family physician or the after hours number for the clinic or go to the Emergency Department.   BELOW ARE SYMPTOMS THAT SHOULD BE REPORTED IMMEDIATELY:  *FEVER GREATER THAN 100.5 F  *CHILLS WITH OR WITHOUT FEVER  NAUSEA AND VOMITING THAT IS NOT CONTROLLED WITH YOUR NAUSEA MEDICATION  *UNUSUAL SHORTNESS OF BREATH  *UNUSUAL BRUISING OR BLEEDING  TENDERNESS IN MOUTH AND THROAT WITH OR WITHOUT PRESENCE OF ULCERS  *URINARY PROBLEMS  *BOWEL PROBLEMS  UNUSUAL RASH Items with * indicate a potential emergency and should be followed up as soon as possible.  One of the nurses will contact you 24 hours after your treatment. Please let the nurse know about any problems that you may have experienced. Feel free to call the clinic you have any questions or concerns. The clinic phone number is [phone].   I have been informed and understand all the instructions given to me. I know to contact the clinic, my physician, or go to the Emergency Department if any problems should occur. I do not have any questions at this time, but understand that I may call the clinic during office hours   should I have any questions or need assistance in obtaining follow up care.    __________________________________________  _____________  __________ Signature of Patient or Authorized Representative            Date                   Time    __________________________________________ Nurse's Signature    

## 2012-11-11 NOTE — Progress Notes (Signed)
Checked in new patient. No financial issues. °

## 2012-11-11 NOTE — Progress Notes (Signed)
Ok to treat with WBC 1.8, platelts 51 and Anc 1.8 per Dr. Gaylyn Rong.

## 2012-11-11 NOTE — Progress Notes (Signed)
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   PAST THERAPY: Biopsy only   CURRENT THERAPY: Here to start Vidaza today 11/11/2012.    INTERVAL HISTORY: Kyle Brewer 78 y.o. male returns for regular follow up with his son to go over any last minute question and to start therapy.  He reported feeling SOB/DOE such as working in the yard or going [employer]. He denied PND, orthopnea, pedal edema.   Patient denies fever, anorexia, weight loss, fatigue, headache, visual changes, confusion, drenching night sweats, palpable lymph node swelling, mucositis, odynophagia, dysphagia, nausea vomiting, jaundice, chest pain, palpitation, productive cough, gum bleeding, epistaxis, hematemesis, hemoptysis, abdominal pain, abdominal swelling, early satiety, melena, hematochezia, hematuria, skin rash, spontaneous bleeding, joint swelling, joint pain, heat or cold intolerance, bowel bladder incontinence, back pain, focal motor weakness, paresthesia, depression, suicidal or homicidal ideation, feeling hopelessness.   Past Medical History  Diagnosis Date  . Inguinal hernia     right  . Osteoporosis   . Hypothyroid   . Hyperlipidemia   . Arthritis   . BPH (benign prostatic hyperplasia)   . Wound, open, elbow     Right, followed [facility]  . Hypertension   . CAD (coronary artery disease)     3V CABG 1995, relook cath 2001 with patent grafts. normal stress test 2010.   . Back pain   . CKD (chronic kidney disease), stage I   . Bursitis   . Pancytopenia   . Kidney stone   . 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 MDS (myelodysplastic syndrome), high grade     Past Surgical History  Procedure Date  . Appendectomy 1968  . Coronary artery bypass graft 1995  . Total knee arthroplasty 2007    right  . Elbow arthroscopy 2012      right  . Transurethral resection of prostate     Dr. Isabel Caprice  . Hernia repair 11/07/11    lap bilateral inguinal    Current Outpatient Prescriptions  Medication Sig Dispense Refill  . alendronate (FOSAMAX) 70 MG tablet Take 70 mg by mouth every 7 (seven) days. Take with a full glass of water on an empty stomach.       Marland Kitchen amLODipine (NORVASC) 5 MG tablet Take 1 tablet (5 mg total) by mouth daily.  90 tablet  2  . Ascorbic Acid (VITAMIN C PO) Take by mouth daily.       Marland Kitchen aspirin 81 MG tablet Take 81 mg by mouth daily.        Marland Kitchen CALCIUM PO Take 600 mg by mouth 2 (two) times daily. Plus Vit D      . Cyanocobalamin (VITAMIN B12 PO) Take 1,000 mcg by mouth daily.       Marland Kitchen dutasteride (AVODART) 0.5 MG capsule Take 0.5 mg by mouth daily.        Marland Kitchen levothyroxine (SYNTHROID, LEVOTHROID) 50 MCG tablet Take 50 mcg by mouth daily.        Marland Kitchen lisinopril (PRINIVIL,ZESTRIL) 10 MG tablet Take 10 mg by mouth daily.      Marland Kitchen LORazepam (ATIVAN) 0.5 MG tablet Take 0.5 mg by mouth at bedtime as needed.      . meloxicam (MOBIC) 7.5 MG tablet Take 7.5 mg by mouth 2 (two) times daily.       . metoprolol succinate (TOPROL XL) 100 MG  24 hr tablet Take 1 tablet (100 mg total) by mouth daily.  90 tablet  2  . Multiple Vitamin (ONE-A-DAY MENS PO) Take by mouth daily.       . simvastatin (ZOCOR) 20 MG tablet Take 20 mg by mouth at bedtime.        Marland Kitchen terazosin (HYTRIN) 10 MG capsule Take 10 mg by mouth at bedtime.         No current facility-administered medications for this visit.   Facility-Administered Medications Ordered in Other Visits  Medication Dose Route Frequency Provider Last Rate Last Dose  . [COMPLETED] azaCITIDine (VIDAZA) chemo injection 150 mg  75 mg/m2 (Order-Specific) Subcutaneous Once Exie Parody, MD   150 mg at 11/11/12 1326  . [COMPLETED] ondansetron (ZOFRAN) tablet 8 mg  8 mg Oral Once Exie Parody, MD   8 mg at 11/11/12 1247    ALLERGIES:   has no known allergies.  REVIEW OF SYSTEMS:  The rest of the  14-point review of system was negative.   Filed Vitals:   11/11/12 1151  BP: 125/65  Pulse: 65  Temp: 97.6 F (36.4 C)  Resp: 18   Wt Readings from Last 3 Encounters:  11/11/12 191 lb 12.8 oz (87 kg)  10/30/12 191 lb 8 oz (86.864 kg)  10/18/12 191 lb 4.8 oz (86.773 kg)   ECOG Performance status: 0-1  PHYSICAL EXAMINATION:   General:  well-nourished man, in no acute distress.  Eyes:  no scleral icterus.  ENT:  There were no oropharyngeal lesions.  Neck was without thyromegaly.  Lymphatics:  Negative cervical, supraclavicular or axillary adenopathy.  Respiratory: lungs were clear bilaterally without wheezing or crackles.  Cardiovascular:  Regular rate and rhythm, S1/S2, without murmur, rub or gallop.  There was no pedal edema.  GI:  abdomen was soft, flat, nontender, nondistended, without organomegaly.  Muscoloskeletal:  no spinal tenderness of palpation of vertebral spine.  Skin exam was without echymosis, petichae.  Neuro exam was nonfocal.  Patient was able to get on and off exam table without assistance.  Gait was normal.  Patient was alerted and oriented.  Attention was good.   Language was appropriate.  Mood was normal without depression.  Speech was not pressured.  Thought content was not tangential.     LABORATORY/RADIOLOGY DATA:  Lab Results  Component Value Date   WBC 1.8* 11/11/2012   HGB 10.6* 11/11/2012   HCT 31.4* 11/11/2012   PLT 51* 11/11/2012   GLUCOSE 90 11/11/2012   ALKPHOS 58 11/11/2012   ALT 18 11/11/2012   AST 17 11/11/2012   NA 142 11/11/2012   K 4.3 11/11/2012   CL 110* 11/11/2012   CREATININE 1.6* 11/11/2012   BUN 28.0* 11/11/2012   CO2 26 11/11/2012   INR 1.06 11/04/2012     ASSESSMENT AND PLAN:   1. Hypertension: He is on amlodipine, lisinopril, metoprolol.  2. Hyperlipidemia: He is on simvastatin.  3. CAD: He is on ASA, simvastatin, lisinopril, and metoprolol.  4. Hypothyroidism: He is on Synthroid.  5. BPH: He is on dutasteride and  terazosin.  6. Osteoporosis: He is on Fosamax.  7. 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 Without treatment, the bone marrow will fail. There will be problems with recurrent infection, bleeding, fatigue.  - He and his family had thought it over.  He declined the offer to go to [facility] for 2nd opinion.  He would like to proceed with Vidaza here.   -  Treatment:  Vidaza SQ d1-5, and d8-9 of every 4 weeks.  - Potential side effects of Vidaza include but not limited to cytopenia, infection, fatigue, more SOB, bleeding, skin rash.   -Chance of response:  About 50% in normalizing blood count and controlling leukemia.  He expressed informed understanding and wished to proceed today.  -Follow up:  Once a week due to potential need for frequent blood transfusion.   8.  SOB/DOE:  Due to anemia.  There is no component of CHF or cardiac.  9.  Pancytopenia:  Due to MDS/AML.  There is no active bleeding.  There is no indication for transfusion today.  Goal of transfusion Hgb <7; Plt <10K or active bleeding.   10 . Code status: I discussed the poor prognosis with MDS and transformed AML last week. He is leaning toward DNR/DNI; however, he needs time to discuss with the rest of his family.  We will continue to address this issue when the time is right.       The length of time of the face-to-face encounter was 25 minutes. More than 50% of time was spent counseling and coordination of care.

## 2012-11-12 ENCOUNTER — Telehealth: Payer: Self-pay | Admitting: Oncology

## 2012-11-12 ENCOUNTER — Encounter (HOSPITAL_COMMUNITY)
Admission: RE | Admit: 2012-11-12 | Discharge: 2012-11-12 | Disposition: A | Discharge status: Home / Self Care | Payer: Medicare Other | Source: Ambulatory Visit | Attending: Oncology | Admitting: Oncology

## 2012-11-12 ENCOUNTER — Other Ambulatory Visit: Payer: Self-pay | Admitting: Oncology

## 2012-11-12 ENCOUNTER — Ambulatory Visit (HOSPITAL_BASED_OUTPATIENT_CLINIC_OR_DEPARTMENT_OTHER): Payer: Medicare Other

## 2012-11-12 ENCOUNTER — Encounter: Payer: Self-pay | Admitting: Oncology

## 2012-11-12 VITALS — BP 134/66 | HR 62 | Temp 97.5°F

## 2012-11-12 DIAGNOSIS — C9200 Acute myeloblastic leukemia, not having achieved remission: Secondary | ICD-10-CM

## 2012-11-12 DIAGNOSIS — Z5111 Encounter for antineoplastic chemotherapy: Secondary | ICD-10-CM

## 2012-11-12 DIAGNOSIS — D46Z Other myelodysplastic syndromes: Secondary | ICD-10-CM

## 2012-11-12 MED ORDER — AZACITIDINE CHEMO SQ INJECTION
75.0000 mg/m2 | Freq: Once | INTRAMUSCULAR | Status: AC
  Administered 2012-11-12: 150 mg via SUBCUTANEOUS
  Filled 2012-11-12: qty 30

## 2012-11-12 MED ORDER — ONDANSETRON HCL 8 MG PO TABS
8.0000 mg | ORAL_TABLET | Freq: Once | ORAL | Status: AC
  Administered 2012-11-12: 8 mg via ORAL

## 2012-11-12 NOTE — Telephone Encounter (Signed)
Gave pt appt for November and December 2014 lab Md and chemo

## 2012-11-12 NOTE — Patient Instructions (Addendum)
[facility] Discharge Instructions for Patients Receiving Chemotherapy  Today you received the following chemotherapy agents [facility] To help prevent nausea and vomiting after your treatment, we encourage you to take your nausea medication as prescribed.  If you develop nausea and vomiting that is not controlled by your nausea medication, call the clinic. If it is after clinic hours your family physician or the after hours number for the clinic or go to the Emergency Department.   BELOW ARE SYMPTOMS THAT SHOULD BE REPORTED IMMEDIATELY:  *FEVER GREATER THAN 100.5 F  *CHILLS WITH OR WITHOUT FEVER  NAUSEA AND VOMITING THAT IS NOT CONTROLLED WITH YOUR NAUSEA MEDICATION  *UNUSUAL SHORTNESS OF BREATH  *UNUSUAL BRUISING OR BLEEDING  TENDERNESS IN MOUTH AND THROAT WITH OR WITHOUT PRESENCE OF ULCERS  *URINARY PROBLEMS  *BOWEL PROBLEMS  UNUSUAL RASH Items with * indicate a potential emergency and should be followed up as soon as possible.  One of the nurses will contact you 24 hours after your treatment. Please let the nurse know about any problems that you may have experienced. Feel free to call the clinic you have any questions or concerns. The clinic phone number is [phone].   I have been informed and understand all the instructions given to me. I know to contact the clinic, my physician, or go to the Emergency Department if any problems should occur. I do not have any questions at this time, but understand that I may call the clinic during office hours   should I have any questions or need assistance in obtaining follow up care.    __________________________________________  _____________  __________ Signature of Patient or Authorized Representative            Date                   Time    __________________________________________ Nurse's Signature

## 2012-11-13 ENCOUNTER — Ambulatory Visit (HOSPITAL_BASED_OUTPATIENT_CLINIC_OR_DEPARTMENT_OTHER): Payer: Medicare Other

## 2012-11-13 ENCOUNTER — Other Ambulatory Visit: Payer: Self-pay

## 2012-11-13 ENCOUNTER — Telehealth: Payer: Self-pay | Admitting: Oncology

## 2012-11-13 VITALS — BP 129/71 | HR 65 | Temp 98.2°F | Resp 20

## 2012-11-13 DIAGNOSIS — Z5111 Encounter for antineoplastic chemotherapy: Secondary | ICD-10-CM

## 2012-11-13 DIAGNOSIS — C9200 Acute myeloblastic leukemia, not having achieved remission: Secondary | ICD-10-CM

## 2012-11-13 DIAGNOSIS — D46Z Other myelodysplastic syndromes: Secondary | ICD-10-CM

## 2012-11-13 MED ORDER — ONDANSETRON HCL 8 MG PO TABS
8.0000 mg | ORAL_TABLET | Freq: Once | ORAL | Status: AC
  Administered 2012-11-13: 8 mg via ORAL

## 2012-11-13 MED ORDER — AZACITIDINE CHEMO SQ INJECTION
75.0000 mg/m2 | Freq: Once | INTRAMUSCULAR | Status: AC
  Administered 2012-11-13: 150 mg via SUBCUTANEOUS
  Filled 2012-11-13: qty 30

## 2012-11-13 MED ORDER — LIDOCAINE-PRILOCAINE 2.5-2.5 % EX CREA: TOPICAL_CREAM | CUTANEOUS | Status: DC | PRN

## 2012-11-13 NOTE — Telephone Encounter (Signed)
Schedule complete. Called pt he will get new schedule today.

## 2012-11-13 NOTE — Patient Instructions (Addendum)
[facility] Discharge Instructions for Patients Receiving Chemotherapy  Today you received the following chemotherapy agents ; Vidaza.  If you develop nausea and vomiting that is not controlled by your nausea medication, call the clinic. If it is after clinic hours your family physician or the after hours number for the clinic or go to the Emergency Department.   BELOW ARE SYMPTOMS THAT SHOULD BE REPORTED IMMEDIATELY:  *FEVER GREATER THAN 100.5 F  *CHILLS WITH OR WITHOUT FEVER  NAUSEA AND VOMITING THAT IS NOT CONTROLLED WITH YOUR NAUSEA MEDICATION  *UNUSUAL SHORTNESS OF BREATH  *UNUSUAL BRUISING OR BLEEDING  TENDERNESS IN MOUTH AND THROAT WITH OR WITHOUT PRESENCE OF ULCERS  *URINARY PROBLEMS  *BOWEL PROBLEMS  UNUSUAL RASH Items with * indicate a potential emergency and should be followed up as soon as possible.   Please let the nurse know about any problems that you may have experienced. Feel free to call the clinic you have any questions or concerns. The clinic phone number is [phone].   I have been informed and understand all the instructions given to me. I know to contact the clinic, my physician, or go to the Emergency Department if any problems should occur. I do not have any questions at this time, but understand that I may call the clinic during office hours   should I have any questions or need assistance in obtaining follow up care.    __________________________________________  _____________  __________ Signature of Patient or Authorized Representative            Date                   Time    __________________________________________ Nurse's Signature

## 2012-11-14 ENCOUNTER — Ambulatory Visit (HOSPITAL_BASED_OUTPATIENT_CLINIC_OR_DEPARTMENT_OTHER): Payer: Medicare Other

## 2012-11-14 VITALS — BP 106/57 | HR 62 | Temp 97.9°F | Resp 18

## 2012-11-14 DIAGNOSIS — D46Z Other myelodysplastic syndromes: Secondary | ICD-10-CM

## 2012-11-14 DIAGNOSIS — Z5111 Encounter for antineoplastic chemotherapy: Secondary | ICD-10-CM

## 2012-11-14 DIAGNOSIS — C9200 Acute myeloblastic leukemia, not having achieved remission: Secondary | ICD-10-CM

## 2012-11-14 MED ORDER — AZACITIDINE CHEMO SQ INJECTION
75.0000 mg/m2 | Freq: Once | INTRAMUSCULAR | Status: AC
  Administered 2012-11-14: 150 mg via SUBCUTANEOUS
  Filled 2012-11-14: qty 30

## 2012-11-14 MED ORDER — ONDANSETRON HCL 8 MG PO TABS
8.0000 mg | ORAL_TABLET | Freq: Once | ORAL | Status: AC
  Administered 2012-11-14: 8 mg via ORAL

## 2012-11-14 NOTE — Progress Notes (Signed)
11/12/12 Called Raynelle Chary [phone] and spoke to [facility] and [place] does require precert. Faxed 33 pages to [phone].  11/14/12 Called to check on case and we had the wrong ID #.  Corrie Dandy gave me the new one [id number]. Re-faxed 33 pages as well as my confirmation to [employer].

## 2012-11-14 NOTE — Patient Instructions (Addendum)
[facility] Discharge Instructions for Patients Receiving Chemotherapy  Today you received the following chemotherapy agents: Vidaza. To help prevent nausea and vomiting after your treatment, we encourage you to take your nausea medicationas often as prescribed for the next 72 hours.   If you develop nausea and vomiting that is not controlled by your nausea medication, call the clinic. If it is after clinic hours your family physician or the after hours number for the clinic or go to the Emergency Department.   BELOW ARE SYMPTOMS THAT SHOULD BE REPORTED IMMEDIATELY:  *FEVER GREATER THAN 100.5 F  *CHILLS WITH OR WITHOUT FEVER  NAUSEA AND VOMITING THAT IS NOT CONTROLLED WITH YOUR NAUSEA MEDICATION  *UNUSUAL SHORTNESS OF BREATH  *UNUSUAL BRUISING OR BLEEDING  TENDERNESS IN MOUTH AND THROAT WITH OR WITHOUT PRESENCE OF ULCERS  *URINARY PROBLEMS  *BOWEL PROBLEMS  UNUSUAL RASH Items with * indicate a potential emergency and should be followed up as soon as possible.  Feel free to call the clinic you have any questions or concerns. The clinic phone number is [phone].   I have been informed and understand all the instructions given to me. I know to contact the clinic, my physician, or go to the Emergency Department if any problems should occur. I do not have any questions at this time, but understand that I may call the clinic during office hours   should I have any questions or need assistance in obtaining follow up care.    __________________________________________  _____________  __________ Signature of Patient or Authorized Representative            Date                   Time    __________________________________________ Nurse's Signature

## 2012-11-15 ENCOUNTER — Other Ambulatory Visit (HOSPITAL_BASED_OUTPATIENT_CLINIC_OR_DEPARTMENT_OTHER): Payer: Medicare Other | Admitting: Lab

## 2012-11-15 ENCOUNTER — Ambulatory Visit (HOSPITAL_BASED_OUTPATIENT_CLINIC_OR_DEPARTMENT_OTHER): Payer: Medicare Other

## 2012-11-15 ENCOUNTER — Ambulatory Visit: Payer: Medicare Other

## 2012-11-15 VITALS — BP 130/66 | HR 63 | Temp 98.3°F | Resp 20

## 2012-11-15 DIAGNOSIS — D46Z Other myelodysplastic syndromes: Secondary | ICD-10-CM

## 2012-11-15 DIAGNOSIS — D469 Myelodysplastic syndrome, unspecified: Secondary | ICD-10-CM

## 2012-11-15 DIAGNOSIS — Z5111 Encounter for antineoplastic chemotherapy: Secondary | ICD-10-CM

## 2012-11-15 DIAGNOSIS — C9200 Acute myeloblastic leukemia, not having achieved remission: Secondary | ICD-10-CM

## 2012-11-15 LAB — COMPREHENSIVE METABOLIC PANEL (CC13)
ALT: 15 U/L (ref 0–55)
AST: 13 U/L (ref 5–34)
Alkaline Phosphatase: 59 U/L (ref 40–150)
Creatinine: 2.2 mg/dL — ABNORMAL HIGH (ref 0.7–1.3)
Sodium: 141 mEq/L (ref 136–145)
Total Bilirubin: 0.49 mg/dL (ref 0.20–1.20)
Total Protein: 6.1 g/dL — ABNORMAL LOW (ref 6.4–8.3)

## 2012-11-15 LAB — CBC WITH DIFFERENTIAL/PLATELET
Basophils Absolute: 0 10*3/uL (ref 0.0–0.1)
Eosinophils Absolute: 0 10*3/uL (ref 0.0–0.5)
HCT: 32.3 % — ABNORMAL LOW (ref 38.4–49.9)
LYMPH%: 70.3 % — ABNORMAL HIGH (ref 14.0–49.0)
MCV: 100.7 fL — ABNORMAL HIGH (ref 79.3–98.0)
MONO#: 0.1 10*3/uL (ref 0.1–0.9)
NEUT#: 0.4 10*3/uL — CL (ref 1.5–6.5)
NEUT%: 23.5 % — ABNORMAL LOW (ref 39.0–75.0)
Platelets: 46 10*3/uL — ABNORMAL LOW (ref 140–400)
WBC: 1.7 10*3/uL — ABNORMAL LOW (ref 4.0–10.3)

## 2012-11-15 MED ORDER — AZACITIDINE CHEMO SQ INJECTION
75.0000 mg/m2 | Freq: Once | INTRAMUSCULAR | Status: AC
  Administered 2012-11-15: 150 mg via SUBCUTANEOUS
  Filled 2012-11-15: qty 30

## 2012-11-15 MED ORDER — ONDANSETRON HCL 8 MG PO TABS
8.0000 mg | ORAL_TABLET | Freq: Once | ORAL | Status: AC
  Administered 2012-11-15: 8 mg via ORAL

## 2012-11-15 NOTE — Progress Notes (Deleted)
Faxed recent labs to [facility], Attention: Leonia Reader [phone] fax / [phone])

## 2012-11-15 NOTE — Patient Instructions (Addendum)
[facility] Discharge Instructions for Patients Receiving Chemotherapy  Today you received the following chemotherapy agents Vidaza    If you develop nausea and vomiting that is not controlled by your nausea medication, call the clinic. If it is after clinic hours your family physician or the after hours number for the clinic or go to the Emergency Department.   BELOW ARE SYMPTOMS THAT SHOULD BE REPORTED IMMEDIATELY:  *FEVER GREATER THAN 100.5 F  *CHILLS WITH OR WITHOUT FEVER  NAUSEA AND VOMITING THAT IS NOT CONTROLLED WITH YOUR NAUSEA MEDICATION  *UNUSUAL SHORTNESS OF BREATH  *UNUSUAL BRUISING OR BLEEDING  TENDERNESS IN MOUTH AND THROAT WITH OR WITHOUT PRESENCE OF ULCERS  *URINARY PROBLEMS  *BOWEL PROBLEMS  UNUSUAL RASH Items with * indicate a potential emergency and should be followed up as soon as possible.  One of the nurses will contact you 24 hours after your treatment. Please let the nurse know about any problems that you may have experienced. Feel free to call the clinic you have any questions or concerns. The clinic phone number is [phone].   I have been informed and understand all the instructions given to me. I know to contact the clinic, my physician, or go to the Emergency Department if any problems should occur. I do not have any questions at this time, but understand that I may call the clinic during office hours   should I have any questions or need assistance in obtaining follow up care.    __________________________________________  _____________  __________ Signature of Patient or Authorized Representative            Date                   Time    __________________________________________ Nurse's Signature    

## 2012-11-18 ENCOUNTER — Ambulatory Visit (HOSPITAL_BASED_OUTPATIENT_CLINIC_OR_DEPARTMENT_OTHER): Payer: Medicare Other

## 2012-11-18 ENCOUNTER — Other Ambulatory Visit (HOSPITAL_BASED_OUTPATIENT_CLINIC_OR_DEPARTMENT_OTHER): Payer: Medicare Other | Admitting: Lab

## 2012-11-18 ENCOUNTER — Ambulatory Visit (HOSPITAL_BASED_OUTPATIENT_CLINIC_OR_DEPARTMENT_OTHER): Payer: Medicare Other | Admitting: Oncology

## 2012-11-18 VITALS — BP 150/76 | HR 64 | Temp 97.0°F | Resp 20 | Ht 70.0 in | Wt 191.9 lb

## 2012-11-18 DIAGNOSIS — C9200 Acute myeloblastic leukemia, not having achieved remission: Secondary | ICD-10-CM

## 2012-11-18 DIAGNOSIS — M810 Age-related osteoporosis without current pathological fracture: Secondary | ICD-10-CM

## 2012-11-18 DIAGNOSIS — D61818 Other pancytopenia: Secondary | ICD-10-CM

## 2012-11-18 DIAGNOSIS — D469 Myelodysplastic syndrome, unspecified: Secondary | ICD-10-CM

## 2012-11-18 DIAGNOSIS — Z5111 Encounter for antineoplastic chemotherapy: Secondary | ICD-10-CM

## 2012-11-18 DIAGNOSIS — D46Z Other myelodysplastic syndromes: Secondary | ICD-10-CM

## 2012-11-18 LAB — CBC WITH DIFFERENTIAL/PLATELET
BASO%: 0 % (ref 0.0–2.0)
HCT: 31.9 % — ABNORMAL LOW (ref 38.4–49.9)
LYMPH%: 75.4 % — ABNORMAL HIGH (ref 14.0–49.0)
MCHC: 34.5 g/dL (ref 32.0–36.0)
MCV: 95.8 fL (ref 79.3–98.0)
MONO%: 3.4 % (ref 0.0–14.0)
NEUT%: 19.2 % — ABNORMAL LOW (ref 39.0–75.0)
Platelets: 47 10*3/uL — ABNORMAL LOW (ref 140–400)
RBC: 3.33 10*6/uL — ABNORMAL LOW (ref 4.20–5.82)
nRBC: 0 % (ref 0–0)

## 2012-11-18 MED ORDER — ONDANSETRON HCL 8 MG PO TABS
8.0000 mg | ORAL_TABLET | Freq: Once | ORAL | Status: AC
  Administered 2012-11-18: 8 mg via ORAL

## 2012-11-18 MED ORDER — AZACITIDINE CHEMO SQ INJECTION
75.0000 mg/m2 | Freq: Once | INTRAMUSCULAR | Status: AC
  Administered 2012-11-18: 150 mg via SUBCUTANEOUS
  Filled 2012-11-18: qty 30

## 2012-11-18 NOTE — Progress Notes (Signed)
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   PAST THERAPY: Biopsy only   CURRENT THERAPY:  started on 11/11/2012 Vidaza (hypomethylating chemo) d1-5, d8-9; of every 4 weeks cycle.    INTERVAL HISTORY: Kyle Brewer 78 y.o. male returns for regular follow up with his son.  He reported feeling well.  With chemo, he no longer felt SOB, DOE.  He had some rash on the abdominal wall at the site of injection.  However, it does not itch or bother him.  He denied fever, mucositis, nausea/vomiting, cough, skin abscess, bone pain.   The rest of the 14 point review of system was negative.    Past Medical History  Diagnosis Date  . Inguinal hernia     right  . Osteoporosis   . Hypothyroid   . Hyperlipidemia   . Arthritis   . BPH (benign prostatic hyperplasia)   . Wound, open, elbow     Right, followed [facility]  . Hypertension   . CAD (coronary artery disease)     3V CABG 1995, relook cath 2001 with patent grafts. normal stress test 2010.   . Back pain   . CKD (chronic kidney disease), stage I   . Bursitis   . Pancytopenia   . Kidney stone   . 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 MDS (myelodysplastic syndrome), high grade     Past Surgical History  Procedure Date  . Appendectomy 1968  . Coronary artery bypass graft 1995  . Total knee arthroplasty 2007    right  . Elbow arthroscopy 2012    right  . Transurethral resection of prostate     Dr. Isabel Caprice  . Hernia repair 11/07/11    lap bilateral inguinal    Current Outpatient Prescriptions  Medication Sig Dispense Refill  . alendronate (FOSAMAX) 70 MG tablet Take 70 mg by mouth every 7 (seven) days. Take with a full glass of water on an empty stomach.       Marland Kitchen amLODipine (NORVASC) 5 MG tablet Take 1 tablet (5 mg total) by mouth daily.  90  tablet  2  . Ascorbic Acid (VITAMIN C PO) Take by mouth daily.       Marland Kitchen aspirin 81 MG tablet Take 81 mg by mouth daily.        Marland Kitchen CALCIUM PO Take 600 mg by mouth 2 (two) times daily. Plus Vit D      . Cyanocobalamin (VITAMIN B12 PO) Take 1,000 mcg by mouth daily.       Marland Kitchen dutasteride (AVODART) 0.5 MG capsule Take 0.5 mg by mouth daily.        Marland Kitchen levothyroxine (SYNTHROID, LEVOTHROID) 50 MCG tablet Take 50 mcg by mouth daily.        Marland Kitchen lidocaine-prilocaine (EMLA) cream Apply topically as needed.  30 g  2  . lisinopril (PRINIVIL,ZESTRIL) 10 MG tablet Take 10 mg by mouth daily.      Marland Kitchen LORazepam (ATIVAN) 0.5 MG tablet Take 0.5 mg by mouth at bedtime as needed.      . meloxicam (MOBIC) 7.5 MG tablet Take 7.5 mg by mouth 2 (two) times daily.       . metoprolol succinate (TOPROL XL) 100 MG 24 hr tablet Take 1 tablet (100 mg total) by mouth daily.  90 tablet  2  .  Multiple Vitamin (ONE-A-DAY MENS PO) Take by mouth daily.       . simvastatin (ZOCOR) 20 MG tablet Take 20 mg by mouth at bedtime.        Marland Kitchen terazosin (HYTRIN) 10 MG capsule Take 10 mg by mouth at bedtime.         Current Facility-Administered Medications  Medication Dose Route Frequency Provider Last Rate Last Dose  . [COMPLETED] azaCITIDine (VIDAZA) chemo injection 150 mg  75 mg/m2 (Order-Specific) Subcutaneous Once Benjiman Core, MD   150 mg at 11/18/12 1155  . [COMPLETED] ondansetron (ZOFRAN) tablet 8 mg  8 mg Oral Once Exie Parody, MD   8 mg at 11/18/12 1147    ALLERGIES:   has no known allergies.  REVIEW OF SYSTEMS:  The rest of the 14-point review of system was negative.   Filed Vitals:   11/18/12 1102  BP: 150/76  Pulse: 64  Temp: 97 F (36.1 C)  Resp: 20   Wt Readings from Last 3 Encounters:  11/18/12 191 lb 14.4 oz (87.045 kg)  11/11/12 191 lb 12.8 oz (87 kg)  10/30/12 191 lb 8 oz (86.864 kg)   ECOG Performance status: 0-1  PHYSICAL EXAMINATION:   General:  well-nourished man, in no acute distress.  Eyes:  no scleral  icterus.  ENT:  There were no oropharyngeal lesions.  Neck was without thyromegaly.  Lymphatics:  Negative cervical, supraclavicular or axillary adenopathy.  Respiratory: lungs were clear bilaterally without wheezing or crackles.  Cardiovascular:  Regular rate and rhythm, S1/S2, without murmur, rub or gallop.  There was no pedal edema.  GI:  abdomen was soft, flat, nontender, nondistended, without organomegaly.  Muscoloskeletal:  no spinal tenderness of palpation of vertebral spine.  Skin exam was without petichae. There was some ecchymosis on the skin over the abdomen where he had Vidaza injection last week.  There was pain, erythema to palpation.  Neuro exam was nonfocal.  Patient was able to get on and off exam table without assistance.  Gait was normal.  Patient was alerted and oriented.  Attention was good.   Language was appropriate.  Mood was normal without depression.  Speech was not pressured.  Thought content was not tangential.     LABORATORY/RADIOLOGY DATA:  Lab Results  Component Value Date   WBC 2.0* 11/18/2012   HGB 11.0* 11/18/2012   HCT 31.9* 11/18/2012   PLT 47* 11/18/2012   GLUCOSE 92 11/15/2012   ALKPHOS 59 11/15/2012   ALT 15 11/15/2012   AST 13 11/15/2012   NA 141 11/15/2012   K 5.0 11/15/2012   CL 111* 11/15/2012   CREATININE 2.2* 11/15/2012   BUN 41.0* 11/15/2012   CO2 25 11/15/2012   INR 1.06 11/04/2012     ASSESSMENT AND PLAN:   1. Hypertension: He is on amlodipine, lisinopril, metoprolol.  2. Hyperlipidemia: He is on simvastatin.  3. CAD: He is on ASA, simvastatin, lisinopril, and metoprolol.  4. Hypothyroidism: He is on Synthroid.  5. BPH: He is on dutasteride and terazosin.  6. Osteoporosis: He is on Fosamax.  7. 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  He has slight improvement of his Hgb and slight worsening of his thrombocytopenia.  It's too early  to assess whether it is working or not until after at least 3-4 cycles.  I recommended to proceed with cycle #1, days 8 and 9  today and tomorrow without dose modification.  Mr. Vota and his son expressed informed understanding and wished to proceed.  -Follow up:  Once a week CBC check [facility] at the [facility] due to potential need for frequent blood transfusion.  Return visit with me in about 3 weeks before starting cycle #2 of Vidaza.   8.  SOB/DOE:  Improved with improvement of Hgb after one week of chemo.   9.  Pancytopenia:  Due to MDS/AML.  There is no active bleeding.  There is no indication for transfusion today.  Goal of transfusion Hgb <7 or 8; Plt <10K or active bleeding.   10 . Code status: FULL CODE for now.     The length of time of the face-to-face encounter was 15 minutes. More than 50% of time was spent counseling and coordination of care.

## 2012-11-18 NOTE — Patient Instructions (Addendum)
1.  Diagnosis:  Transformed MDS (myelodysplastic syndrome) to AML (Acute Myeloid Leukemia) 2.  Treatment:   Vidaza SQ d1-5; and 8-9 every 4 weeks.   3.  Follow up:  Weekly blood test to see if you need blood transfusion.

## 2012-11-19 ENCOUNTER — Ambulatory Visit (HOSPITAL_BASED_OUTPATIENT_CLINIC_OR_DEPARTMENT_OTHER): Payer: Medicare Other

## 2012-11-19 VITALS — BP 124/61 | HR 66 | Temp 97.7°F | Resp 16

## 2012-11-19 DIAGNOSIS — Z5111 Encounter for antineoplastic chemotherapy: Secondary | ICD-10-CM

## 2012-11-19 DIAGNOSIS — C9200 Acute myeloblastic leukemia, not having achieved remission: Secondary | ICD-10-CM

## 2012-11-19 DIAGNOSIS — D46Z Other myelodysplastic syndromes: Secondary | ICD-10-CM

## 2012-11-19 MED ORDER — ONDANSETRON HCL 8 MG PO TABS
8.0000 mg | ORAL_TABLET | Freq: Once | ORAL | Status: AC
Start: 2012-11-19 — End: 2012-11-19
  Administered 2012-11-19: 8 mg via ORAL

## 2012-11-19 MED ORDER — AZACITIDINE CHEMO SQ INJECTION
75.0000 mg/m2 | Freq: Once | INTRAMUSCULAR | Status: AC
  Administered 2012-11-19: 150 mg via SUBCUTANEOUS
  Filled 2012-11-19: qty 6

## 2012-11-19 NOTE — Patient Instructions (Addendum)
[facility] Discharge Instructions for Patients Receiving Chemotherapy  Today you received the following chemotherapy agents vidaza SQ  To help prevent nausea and vomiting after your treatment, we encourage you to take your nausea medication if needed Begin taking it at 10pm and take it as often as prescribed for the next 48 hours if needed .   If you develop nausea and vomiting that is not controlled by your nausea medication, call the clinic. If it is after clinic hours your family physician or the after hours number for the clinic or go to the Emergency Department.   BELOW ARE SYMPTOMS THAT SHOULD BE REPORTED IMMEDIATELY:  *FEVER GREATER THAN 100.5 F  *CHILLS WITH OR WITHOUT FEVER  NAUSEA AND VOMITING THAT IS NOT CONTROLLED WITH YOUR NAUSEA MEDICATION  *UNUSUAL SHORTNESS OF BREATH  *UNUSUAL BRUISING OR BLEEDING  TENDERNESS IN MOUTH AND THROAT WITH OR WITHOUT PRESENCE OF ULCERS  *URINARY PROBLEMS  *BOWEL PROBLEMS  UNUSUAL RASH Items with * indicate a potential emergency and should be followed up as soon as possible.  One of the nurses will contact you 24 hours after your treatment. Please let the nurse know about any problems that you may have experienced. Feel free to call the clinic you have any questions or concerns. The clinic phone number is [phone].   I have been informed and understand all the instructions given to me. I know to contact the clinic, my physician, or go to the Emergency Department if any problems should occur. I do not have any questions at this time, but understand that I may call the clinic during office hours   should I have any questions or need assistance in obtaining follow up care.    __________________________________________  _____________  __________ Signature of Patient or Authorized Representative            Date                   Time    __________________________________________ Nurse's Signature

## 2012-11-22 ENCOUNTER — Ambulatory Visit: Payer: Medicare Other

## 2012-11-22 ENCOUNTER — Other Ambulatory Visit (HOSPITAL_BASED_OUTPATIENT_CLINIC_OR_DEPARTMENT_OTHER): Payer: Medicare Other

## 2012-11-22 ENCOUNTER — Other Ambulatory Visit: Payer: Self-pay | Admitting: Oncology

## 2012-11-22 DIAGNOSIS — C9200 Acute myeloblastic leukemia, not having achieved remission: Secondary | ICD-10-CM

## 2012-11-22 DIAGNOSIS — D469 Myelodysplastic syndrome, unspecified: Secondary | ICD-10-CM

## 2012-11-22 LAB — CBC WITH DIFFERENTIAL/PLATELET
Eosinophils Absolute: 0.1 10*3/uL (ref 0.0–0.5)
MONO#: 0.1 10*3/uL (ref 0.1–0.9)
NEUT#: 0.3 10*3/uL — CL (ref 1.5–6.5)
Platelets: 39 10*3/uL — ABNORMAL LOW (ref 140–400)
RBC: 3.19 10*6/uL — ABNORMAL LOW (ref 4.20–5.82)
RDW: 14.3 % (ref 11.0–14.6)
WBC: 2.1 10*3/uL — ABNORMAL LOW (ref 4.0–10.3)

## 2012-11-22 NOTE — Progress Notes (Signed)
Patient's hgb 10.7 and platelets are 39. Patient denies SOB, increased fatigue or spontaneous bleeding. Patient does not need blood products per Dr. Gaylyn Rong. Patient instructed to call the office if he has any questions or concerns.

## 2012-11-24 ENCOUNTER — Encounter (HOSPITAL_COMMUNITY)
Admission: RE | Admit: 2012-11-24 | Discharge: 2012-11-24 | Disposition: A | Discharge status: Home / Self Care | Payer: Medicare Other | Source: Ambulatory Visit | Attending: Oncology | Admitting: Oncology

## 2012-11-29 ENCOUNTER — Other Ambulatory Visit (HOSPITAL_BASED_OUTPATIENT_CLINIC_OR_DEPARTMENT_OTHER): Payer: Medicare Other

## 2012-11-29 ENCOUNTER — Ambulatory Visit: Payer: Medicare Other

## 2012-11-29 DIAGNOSIS — D469 Myelodysplastic syndrome, unspecified: Secondary | ICD-10-CM

## 2012-11-29 LAB — CBC WITH DIFFERENTIAL/PLATELET
Eosinophils Absolute: 0 10*3/uL (ref 0.0–0.5)
HCT: 28.9 % — ABNORMAL LOW (ref 38.4–49.9)
HGB: 9.9 g/dL — ABNORMAL LOW (ref 13.0–17.1)
LYMPH%: 82.9 % — ABNORMAL HIGH (ref 14.0–49.0)
MONO#: 0 10*3/uL — ABNORMAL LOW (ref 0.1–0.9)
NEUT#: 0.2 10*3/uL — CL (ref 1.5–6.5)
NEUT%: 13.1 % — ABNORMAL LOW (ref 39.0–75.0)
Platelets: 39 10*3/uL — ABNORMAL LOW (ref 140–400)
WBC: 1.5 10*3/uL — ABNORMAL LOW (ref 4.0–10.3)

## 2012-11-29 NOTE — Progress Notes (Signed)
Blood transfusion held today, parameters not met, per Dorene Grebe, RN with Dr Gaylyn Rong.

## 2012-12-06 ENCOUNTER — Other Ambulatory Visit (HOSPITAL_BASED_OUTPATIENT_CLINIC_OR_DEPARTMENT_OTHER): Payer: Medicare Other | Admitting: Lab

## 2012-12-06 DIAGNOSIS — C9200 Acute myeloblastic leukemia, not having achieved remission: Secondary | ICD-10-CM

## 2012-12-06 DIAGNOSIS — D469 Myelodysplastic syndrome, unspecified: Secondary | ICD-10-CM

## 2012-12-06 LAB — CBC WITH DIFFERENTIAL/PLATELET
BASO%: 2.4 % — ABNORMAL HIGH (ref 0.0–2.0)
Basophils Absolute: 0 10*3/uL (ref 0.0–0.1)
EOS%: 0.8 % (ref 0.0–7.0)
Eosinophils Absolute: 0 10*3/uL (ref 0.0–0.5)
HCT: 28.7 % — ABNORMAL LOW (ref 38.4–49.9)
HGB: 10 g/dL — ABNORMAL LOW (ref 13.0–17.1)
LYMPH%: 83.2 % — ABNORMAL HIGH (ref 14.0–49.0)
MCH: 35.5 pg — ABNORMAL HIGH (ref 27.2–33.4)
MCHC: 35 g/dL (ref 32.0–36.0)
MCV: 101.2 fL — ABNORMAL HIGH (ref 79.3–98.0)
MONO#: 0 10*3/uL — ABNORMAL LOW (ref 0.1–0.9)
MONO%: 2.1 % (ref 0.0–14.0)
NEUT#: 0.2 10*3/uL — CL (ref 1.5–6.5)
NEUT%: 11.5 % — ABNORMAL LOW (ref 39.0–75.0)
Platelets: 38 10*3/uL — ABNORMAL LOW (ref 140–400)
RBC: 2.83 10*6/uL — ABNORMAL LOW (ref 4.20–5.82)
RDW: 14.9 % — ABNORMAL HIGH (ref 11.0–14.6)
WBC: 1.6 10*3/uL — ABNORMAL LOW (ref 4.0–10.3)
lymph#: 1.3 10*3/uL (ref 0.9–3.3)

## 2012-12-06 LAB — HOLD TUBE, BLOOD BANK

## 2012-12-07 ENCOUNTER — Other Ambulatory Visit: Payer: Self-pay | Admitting: Oncology

## 2012-12-09 ENCOUNTER — Other Ambulatory Visit: Payer: Self-pay | Admitting: Oncology

## 2012-12-09 ENCOUNTER — Telehealth: Payer: Self-pay | Admitting: Oncology

## 2012-12-09 ENCOUNTER — Telehealth: Payer: Self-pay | Admitting: Medical Oncology

## 2012-12-09 ENCOUNTER — Ambulatory Visit (HOSPITAL_BASED_OUTPATIENT_CLINIC_OR_DEPARTMENT_OTHER): Payer: Medicare Other | Admitting: Oncology

## 2012-12-09 ENCOUNTER — Other Ambulatory Visit (HOSPITAL_BASED_OUTPATIENT_CLINIC_OR_DEPARTMENT_OTHER): Payer: Medicare Other | Admitting: Lab

## 2012-12-09 ENCOUNTER — Ambulatory Visit (HOSPITAL_BASED_OUTPATIENT_CLINIC_OR_DEPARTMENT_OTHER): Payer: Medicare Other

## 2012-12-09 ENCOUNTER — Encounter: Payer: Self-pay | Admitting: Oncology

## 2012-12-09 VITALS — BP 132/70 | HR 65 | Temp 97.4°F | Resp 20 | Ht 70.0 in | Wt 195.6 lb

## 2012-12-09 DIAGNOSIS — C9200 Acute myeloblastic leukemia, not having achieved remission: Secondary | ICD-10-CM

## 2012-12-09 DIAGNOSIS — Z5111 Encounter for antineoplastic chemotherapy: Secondary | ICD-10-CM

## 2012-12-09 DIAGNOSIS — D469 Myelodysplastic syndrome, unspecified: Secondary | ICD-10-CM

## 2012-12-09 DIAGNOSIS — D46Z Other myelodysplastic syndromes: Secondary | ICD-10-CM

## 2012-12-09 DIAGNOSIS — D61818 Other pancytopenia: Secondary | ICD-10-CM

## 2012-12-09 DIAGNOSIS — M810 Age-related osteoporosis without current pathological fracture: Secondary | ICD-10-CM

## 2012-12-09 LAB — COMPREHENSIVE METABOLIC PANEL (CC13)
AST: 16 U/L (ref 5–34)
BUN: 31 mg/dL — ABNORMAL HIGH (ref 7.0–26.0)
Calcium: 9.2 mg/dL (ref 8.4–10.4)
Chloride: 111 mEq/L — ABNORMAL HIGH (ref 98–107)
Creatinine: 1.6 mg/dL — ABNORMAL HIGH (ref 0.7–1.3)
Glucose: 75 mg/dl (ref 70–99)

## 2012-12-09 MED ORDER — ONDANSETRON HCL 8 MG PO TABS
8.0000 mg | ORAL_TABLET | Freq: Once | ORAL | Status: AC
  Administered 2012-12-09: 8 mg via ORAL

## 2012-12-09 MED ORDER — AZACITIDINE CHEMO SQ INJECTION
75.0000 mg/m2 | Freq: Once | INTRAMUSCULAR | Status: AC
  Administered 2012-12-09: 150 mg via SUBCUTANEOUS
  Filled 2012-12-09: qty 30

## 2012-12-09 NOTE — Patient Instructions (Signed)
[facility] Discharge Instructions for Patients Receiving Chemotherapy  Today you received the following chemotherapy agents: vidaza  To help prevent nausea and vomiting after your treatment, we encourage you to take your nausea medication.  Take it as often as prescribed.     If you develop nausea and vomiting that is not controlled by your nausea medication, call the clinic. If it is after clinic hours your family physician or the after hours number for the clinic or go to the Emergency Department.   BELOW ARE SYMPTOMS THAT SHOULD BE REPORTED IMMEDIATELY:  *FEVER GREATER THAN 100.5 F  *CHILLS WITH OR WITHOUT FEVER  NAUSEA AND VOMITING THAT IS NOT CONTROLLED WITH YOUR NAUSEA MEDICATION  *UNUSUAL SHORTNESS OF BREATH  *UNUSUAL BRUISING OR BLEEDING  TENDERNESS IN MOUTH AND THROAT WITH OR WITHOUT PRESENCE OF ULCERS  *URINARY PROBLEMS  *BOWEL PROBLEMS  UNUSUAL RASH Items with * indicate a potential emergency and should be followed up as soon as possible.  Feel free to call the clinic you have any questions or concerns. The clinic phone number is [phone].   I have been informed and understand all the instructions given to me. I know to contact the clinic, my physician, or go to the Emergency Department if any problems should occur. I do not have any questions at this time, but understand that I may call the clinic during office hours   should I have any questions or need assistance in obtaining follow up care.    __________________________________________  _____________  __________ Signature of Patient or Authorized Representative            Date                   Time    __________________________________________ Nurse's Signature    

## 2012-12-09 NOTE — Telephone Encounter (Signed)
Gave pt appt for December 2013, lab/blood tx/chemo and January 2014 lab,blood tx, MD and chemo

## 2012-12-09 NOTE — Telephone Encounter (Signed)
Per Dr Gaylyn Rong it is okay to treat pt today with today 's cbc results

## 2012-12-09 NOTE — Progress Notes (Signed)
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   PAST THERAPY: Biopsy only   CURRENT THERAPY:  started on 11/11/2012 Vidaza (hypomethylating chemo) d1-5, d8-9; of every 4 weeks cycle.    INTERVAL HISTORY: Kyle Brewer 78 y.o. male returns for regular follow up with his son.  He reported feeling well.  With chemo, he no longer felt SOB, DOE.  He had some rash on the abdominal wall at the site of injection.  However, it does not itch or bother him.  He denied fever, mucositis, nausea/vomiting, cough, skin abscess, bone pain.   The rest of the 14 point review of system was negative.    Past Medical History  Diagnosis Date  . Inguinal hernia     right  . Osteoporosis   . Hypothyroid   . Hyperlipidemia   . Arthritis   . BPH (benign prostatic hyperplasia)   . Wound, open, elbow     Right, followed [facility]  . Hypertension   . CAD (coronary artery disease)     3V CABG 1995, relook cath 2001 with patent grafts. normal stress test 2010.   . Back pain   . CKD (chronic kidney disease), stage I   . Bursitis   . Pancytopenia   . Kidney stone   . 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 MDS (myelodysplastic syndrome), high grade     Past Surgical History  Procedure Date  . Appendectomy 1968  . Coronary artery bypass graft 1995  . Total knee arthroplasty 2007    right  . Elbow arthroscopy 2012    right  . Transurethral resection of prostate     Dr. Isabel Caprice  . Hernia repair 11/07/11    lap bilateral inguinal    Current Outpatient Prescriptions  Medication Sig Dispense Refill  . alendronate (FOSAMAX) 70 MG tablet Take 70 mg by mouth every 7 (seven) days. Take with a full glass of water on an empty stomach.       Marland Kitchen amLODipine (NORVASC) 5 MG tablet Take 1 tablet (5 mg total) by mouth daily.  90  tablet  2  . Ascorbic Acid (VITAMIN C PO) Take by mouth daily.       Marland Kitchen aspirin 81 MG tablet Take 81 mg by mouth daily.        Marland Kitchen CALCIUM PO Take 600 mg by mouth 2 (two) times daily. Plus Vit D      . Cyanocobalamin (VITAMIN B12 PO) Take 1,000 mcg by mouth daily.       Marland Kitchen dutasteride (AVODART) 0.5 MG capsule Take 0.5 mg by mouth daily.        Marland Kitchen levothyroxine (SYNTHROID, LEVOTHROID) 50 MCG tablet Take 50 mcg by mouth daily.        Marland Kitchen lidocaine-prilocaine (EMLA) cream Apply topically as needed.  30 g  2  . lisinopril (PRINIVIL,ZESTRIL) 10 MG tablet Take 10 mg by mouth daily.      Marland Kitchen LORazepam (ATIVAN) 0.5 MG tablet Take 0.5 mg by mouth at bedtime as needed.      . meloxicam (MOBIC) 7.5 MG tablet Take 7.5 mg by mouth 2 (two) times daily.       . metoprolol succinate (TOPROL XL) 100 MG 24 hr tablet Take 1 tablet (100 mg total) by mouth daily.  90 tablet  2  .  Multiple Vitamin (ONE-A-DAY MENS PO) Take by mouth daily.       . simvastatin (ZOCOR) 20 MG tablet Take 20 mg by mouth at bedtime.        Marland Kitchen terazosin (HYTRIN) 10 MG capsule Take 10 mg by mouth at bedtime.          ALLERGIES:   has no known allergies.  REVIEW OF SYSTEMS:  The rest of the 14-point review of system was negative.   Filed Vitals:   12/09/12 1035  BP: 132/70  Pulse: 65  Temp: 97.4 F (36.3 C)  Resp: 20   Wt Readings from Last 3 Encounters:  12/09/12 195 lb 9.6 oz (88.724 kg)  11/18/12 191 lb 14.4 oz (87.045 kg)  11/11/12 191 lb 12.8 oz (87 kg)   ECOG Performance status: 0-1  PHYSICAL EXAMINATION:   General:  well-nourished man, in no acute distress.  Eyes:  no scleral icterus.  ENT:  There were no oropharyngeal lesions.  Neck was without thyromegaly.  Lymphatics:  Negative cervical, supraclavicular or axillary adenopathy.  Respiratory: lungs were clear bilaterally without wheezing or crackles.  Cardiovascular:  Regular rate and rhythm, S1/S2, without murmur, rub or gallop.  There was no pedal edema.  GI:  abdomen was  soft, flat, nontender, nondistended, without organomegaly.  Muscoloskeletal:  no spinal tenderness of palpation of vertebral spine.  Skin exam was without petichae. There was some ecchymosis on the skin over the abdomen where he had Vidaza injection last week.  There was pain, erythema to palpation.  Neuro exam was nonfocal.  Patient was able to get on and off exam table without assistance.  Gait was normal.  Patient was alerted and oriented.  Attention was good.   Language was appropriate.  Mood was normal without depression.  Speech was not pressured.  Thought content was not tangential.     LABORATORY/RADIOLOGY DATA:  Lab Results  Component Value Date   WBC 1.6* 12/06/2012   HGB 10.0* 12/06/2012   HCT 28.7* 12/06/2012   PLT 38* 12/06/2012   GLUCOSE 75 12/09/2012   ALKPHOS 60 12/09/2012   ALT 18 12/09/2012   AST 16 12/09/2012   NA 144 12/09/2012   K 4.4 12/09/2012   CL 111* 12/09/2012   CREATININE 1.6* 12/09/2012   BUN 31.0* 12/09/2012   CO2 27 12/09/2012   INR 1.06 11/04/2012   12/09/12: WBC 1.5, ANC 0.1, Hgb 9.7, Plt 40  ASSESSMENT AND PLAN:   1. Hypertension: He is on amlodipine, lisinopril, metoprolol.  2. Hyperlipidemia: He is on simvastatin.  3. CAD: He is on ASA, simvastatin, lisinopril, and metoprolol.  4. Hypothyroidism: He is on Synthroid.  5. BPH: He is on dutasteride and terazosin.  6. Osteoporosis: He is on Fosamax.  7. 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 Recommend that he proceed with cycle 2 without dose modification. It's too early to assess whether it is working or not until after at least 3-4 cycles.   -Follow up:  Once a week CBC check here at the [facility] due to potential need for frequent blood transfusion.  Return visit with me about 4 weeks before starting cycle #3 of Vidaza.   8.  SOB/DOE:  Improved with improvement of Hgb after one week of chemo.   9.   Pancytopenia:  Due to MDS/AML.  There is no active bleeding.  There is no indication for transfusion today.  Goal of transfusion  Hgb <7 or 8; Plt <10K or active bleeding.   10 . Code status: FULL CODE for now.     The length of time of the face-to-face encounter was 15 minutes. More than 50% of time was spent counseling and coordination of care.

## 2012-12-10 ENCOUNTER — Ambulatory Visit (HOSPITAL_BASED_OUTPATIENT_CLINIC_OR_DEPARTMENT_OTHER): Payer: Medicare Other

## 2012-12-10 VITALS — BP 126/65 | HR 17 | Temp 98.6°F | Resp 64

## 2012-12-10 DIAGNOSIS — Z5111 Encounter for antineoplastic chemotherapy: Secondary | ICD-10-CM

## 2012-12-10 DIAGNOSIS — C9200 Acute myeloblastic leukemia, not having achieved remission: Secondary | ICD-10-CM

## 2012-12-10 DIAGNOSIS — D46Z Other myelodysplastic syndromes: Secondary | ICD-10-CM

## 2012-12-10 MED ORDER — ONDANSETRON HCL 8 MG PO TABS
8.0000 mg | ORAL_TABLET | Freq: Once | ORAL | Status: AC
  Administered 2012-12-10: 8 mg via ORAL

## 2012-12-10 MED ORDER — AZACITIDINE CHEMO SQ INJECTION
75.0000 mg/m2 | Freq: Once | INTRAMUSCULAR | Status: AC
  Administered 2012-12-10: 150 mg via SUBCUTANEOUS
  Filled 2012-12-10: qty 30

## 2012-12-10 NOTE — Patient Instructions (Signed)
[facility] Discharge Instructions for Patients Receiving Chemotherapy  Today you received the following chemotherapy agent Vidaza.  To help prevent nausea and vomiting after your treatment, we encourage you to take your nausea medication. Begin taking your nausea medication as often as prescribed for by your physician.    If you develop nausea and vomiting that is not controlled by your nausea medication, call the clinic. If it is after clinic hours your family physician or the after hours number for the clinic or go to the Emergency Department.   BELOW ARE SYMPTOMS THAT SHOULD BE REPORTED IMMEDIATELY:  *FEVER GREATER THAN 100.5 F  *CHILLS WITH OR WITHOUT FEVER  NAUSEA AND VOMITING THAT IS NOT CONTROLLED WITH YOUR NAUSEA MEDICATION  *UNUSUAL SHORTNESS OF BREATH  *UNUSUAL BRUISING OR BLEEDING  TENDERNESS IN MOUTH AND THROAT WITH OR WITHOUT PRESENCE OF ULCERS  *URINARY PROBLEMS  *BOWEL PROBLEMS  UNUSUAL RASH Items with * indicate a potential emergency and should be followed up as soon as possible.  One of the nurses will contact you 24 hours after your treatment. Please let the nurse know about any problems that you may have experienced. Feel free to call the clinic you have any questions or concerns. The clinic phone number is [phone].   I have been informed and understand all the instructions given to me. I know to contact the clinic, my physician, or go to the Emergency Department if any problems should occur. I do not have any questions at this time, but understand that I may call the clinic during office hours   should I have any questions or need assistance in obtaining follow up care.    __________________________________________  _____________  __________ Signature of Patient or Authorized Representative            Date                   Time    __________________________________________ Nurse's Signature      AZACITIDINE (ay za SITE i  deen) is a chemotherapy drug. This medicine reduces the growth of cancer cells and can suppress the immune system. It is used for treating myelodysplastic syndrome or some types of leukemia. This medicine may be used for other purposes; ask your health care provider or pharmacist if you have questions. What should I tell my health care provider before I take this medicine? They need to know if you have any of these conditions: -infection (especially a virus infection such as chickenpox, cold sores, or herpes) -kidney disease -liver disease -liver tumors -an unusual or allergic reaction to azacitidine, mannitol, other medicines, foods, dyes, or preservatives -pregnant or trying to get pregnant -breast-feeding How should I use this medicine? This medicine is for injection under the skin. It is administered in a hospital or clinic by a specially trained health care professional. Talk to your pediatrician regarding the use of this medicine in children. While this drug may be prescribed for selected conditions, precautions do apply. Overdosage: If you think you have taken too much of this medicine contact a poison control center or emergency room at once. NOTE: This medicine is only for you. Do not share this medicine with others. What if I miss a dose? It is important not to miss your dose. Call your doctor or health care professional if you are unable to keep an appointment. What may interact with this medicine? -vaccines Talk to your doctor or health care professional before taking any of these  medicines: -acetaminophen -aspirin -ibuprofen -ketoprofen -naproxen This list may not describe all possible interactions. Give your health care provider a list of all the medicines, herbs, non-prescription drugs, or dietary supplements you use. Also tell them if you smoke, drink alcohol, or use illegal drugs. Some items may interact with your medicine. What should I watch for while using this  medicine? Visit your doctor for checks on your progress. This drug may make you feel generally unwell. This is not uncommon, as chemotherapy can affect healthy cells as well as cancer cells. Report any side effects. Continue your course of treatment even though you feel ill unless your doctor tells you to stop. In some cases, you may be given additional medicines to help with side effects. Follow all directions for their use. Call your doctor or health care professional for advice if you get a fever, chills or sore throat, or other symptoms of a cold or flu. Do not treat yourself. This drug decreases your body's ability to fight infections. Try to avoid being around people who are sick. This medicine may increase your risk to bruise or bleed. Call your doctor or health care professional if you notice any unusual bleeding. Be careful brushing and flossing your teeth or using a toothpick because you may get an infection or bleed more easily. If you have any dental work done, tell your dentist you are receiving this medicine. Avoid taking products that contain aspirin, acetaminophen, ibuprofen, naproxen, or ketoprofen unless instructed by your doctor. These medicines may hide a fever. Do not have any vaccinations without your doctor's approval and avoid anyone who has recently had oral polio vaccine. Do not become pregnant while taking this medicine. Women should inform their doctor if they wish to become pregnant or think they might be pregnant. There is a potential for serious side effects to an unborn child. Talk to your health care professional or pharmacist for more information. Do not breast-feed an infant while taking this medicine. If you are a man, you should not father a child while receiving treatment. What side effects may I notice from receiving this medicine? Side effects that you should report to your doctor or health care professional as soon as possible: -allergic reactions like skin rash,  itching or hives, swelling of the face, lips, or tongue -low blood counts - this medicine may decrease the number of white blood cells, red blood cells and platelets. You may be at increased risk for infections and bleeding. -signs of infection - fever or chills, cough, sore throat, pain or difficulty passing urine -signs of decreased platelets or bleeding - bruising, pinpoint red spots on the skin, black, tarry stools, blood in the urine -signs of decreased red blood cells - unusually weak or tired, fainting spells, lightheadedness -reactions at the injection site including redness, pain, itching, or bruising -breathing problems -changes in vision -fever -mouth sores -stomach pain -vomiting Side effects that usually do not require medical attention (report to your doctor or health care professional if they continue or are bothersome): -constipation -diarrhea -loss of appetite -nausea -pain or redness at the injection site -weak or tired This list may not describe all possible side effects. Call your doctor for medical advice about side effects. You may report side effects to FDA at 1-800-FDA-1088. Where should I keep my medicine? This drug is given in a hospital or clinic and will not be stored at home. NOTE: This sheet is a summary. It may not cover all possible information. If you  have questions about this medicine, talk to your doctor, pharmacist, or health care provider.  2012, [employer]. (03/05/2008 11:04:07 AM)

## 2012-12-11 ENCOUNTER — Other Ambulatory Visit: Payer: Self-pay | Admitting: Oncology

## 2012-12-11 ENCOUNTER — Ambulatory Visit (HOSPITAL_BASED_OUTPATIENT_CLINIC_OR_DEPARTMENT_OTHER): Payer: Medicare Other

## 2012-12-11 DIAGNOSIS — D46Z Other myelodysplastic syndromes: Secondary | ICD-10-CM

## 2012-12-11 DIAGNOSIS — C9200 Acute myeloblastic leukemia, not having achieved remission: Secondary | ICD-10-CM

## 2012-12-11 DIAGNOSIS — Z5111 Encounter for antineoplastic chemotherapy: Secondary | ICD-10-CM

## 2012-12-11 MED ORDER — AZACITIDINE CHEMO SQ INJECTION
75.0000 mg/m2 | Freq: Once | INTRAMUSCULAR | Status: AC
  Administered 2012-12-11: 150 mg via SUBCUTANEOUS
  Filled 2012-12-11: qty 6

## 2012-12-11 MED ORDER — ONDANSETRON HCL 8 MG PO TABS
8.0000 mg | ORAL_TABLET | Freq: Once | ORAL | Status: AC
  Administered 2012-12-11: 8 mg via ORAL

## 2012-12-11 NOTE — Progress Notes (Signed)
Ok tp treat per [employer].

## 2012-12-12 ENCOUNTER — Ambulatory Visit (HOSPITAL_BASED_OUTPATIENT_CLINIC_OR_DEPARTMENT_OTHER): Payer: Medicare Other

## 2012-12-12 VITALS — BP 137/72 | HR 67 | Temp 97.6°F

## 2012-12-12 DIAGNOSIS — C9200 Acute myeloblastic leukemia, not having achieved remission: Secondary | ICD-10-CM

## 2012-12-12 DIAGNOSIS — D46Z Other myelodysplastic syndromes: Secondary | ICD-10-CM

## 2012-12-12 DIAGNOSIS — Z5111 Encounter for antineoplastic chemotherapy: Secondary | ICD-10-CM

## 2012-12-12 MED ORDER — ONDANSETRON HCL 8 MG PO TABS
8.0000 mg | ORAL_TABLET | Freq: Once | ORAL | Status: AC
  Administered 2012-12-12: 8 mg via ORAL

## 2012-12-12 MED ORDER — AZACITIDINE CHEMO SQ INJECTION
75.0000 mg/m2 | Freq: Once | INTRAMUSCULAR | Status: AC
  Administered 2012-12-12: 150 mg via SUBCUTANEOUS
  Filled 2012-12-12: qty 30

## 2012-12-12 NOTE — Patient Instructions (Addendum)
[facility] Discharge Instructions for Patients Receiving Chemotherapy  Today you received the following chemotherapy agents Vidaza  To help prevent nausea and vomiting after your treatment, we encourage you to take your nausea medication as prescribed.   If you develop nausea and vomiting that is not controlled by your nausea medication, call the clinic. If it is after clinic hours your family physician or the after hours number for the clinic or go to the Emergency Department.   BELOW ARE SYMPTOMS THAT SHOULD BE REPORTED IMMEDIATELY:  *FEVER GREATER THAN 100.5 F  *CHILLS WITH OR WITHOUT FEVER  NAUSEA AND VOMITING THAT IS NOT CONTROLLED WITH YOUR NAUSEA MEDICATION  *UNUSUAL SHORTNESS OF BREATH  *UNUSUAL BRUISING OR BLEEDING  TENDERNESS IN MOUTH AND THROAT WITH OR WITHOUT PRESENCE OF ULCERS  *URINARY PROBLEMS  *BOWEL PROBLEMS  UNUSUAL RASH Items with * indicate a potential emergency and should be followed up as soon as possible.  Feel free to call the clinic you have any questions or concerns. The clinic phone number is [phone].   I have been informed and understand all the instructions given to me. I know to contact the clinic, my physician, or go to the Emergency Department if any problems should occur. I do not have any questions at this time, but understand that I may call the clinic during office hours   should I have any questions or need assistance in obtaining follow up care.    __________________________________________  _____________  __________ Signature of Patient or Authorized Representative            Date                   Time    __________________________________________ Nurse's Signature    

## 2012-12-13 ENCOUNTER — Telehealth: Payer: Self-pay | Admitting: *Deleted

## 2012-12-13 ENCOUNTER — Ambulatory Visit (HOSPITAL_BASED_OUTPATIENT_CLINIC_OR_DEPARTMENT_OTHER): Payer: Medicare Other

## 2012-12-13 ENCOUNTER — Ambulatory Visit: Payer: Medicare Other

## 2012-12-13 ENCOUNTER — Other Ambulatory Visit (HOSPITAL_BASED_OUTPATIENT_CLINIC_OR_DEPARTMENT_OTHER): Payer: Medicare Other

## 2012-12-13 VITALS — BP 126/67 | HR 74 | Temp 98.6°F

## 2012-12-13 DIAGNOSIS — C9200 Acute myeloblastic leukemia, not having achieved remission: Secondary | ICD-10-CM

## 2012-12-13 DIAGNOSIS — Z5111 Encounter for antineoplastic chemotherapy: Secondary | ICD-10-CM

## 2012-12-13 DIAGNOSIS — D46Z Other myelodysplastic syndromes: Secondary | ICD-10-CM

## 2012-12-13 LAB — CBC WITH DIFFERENTIAL/PLATELET
BASO%: 0 % (ref 0.0–2.0)
EOS%: 0.6 % (ref 0.0–7.0)
MCH: 33.7 pg — ABNORMAL HIGH (ref 27.2–33.4)
MCV: 99.3 fL — ABNORMAL HIGH (ref 79.3–98.0)
MONO%: 0.6 % (ref 0.0–14.0)
NEUT#: 0.2 10*3/uL — CL (ref 1.5–6.5)
RBC: 2.79 10*6/uL — ABNORMAL LOW (ref 4.20–5.82)
RDW: 15.6 % — ABNORMAL HIGH (ref 11.0–14.6)
nRBC: 0 % (ref 0–0)

## 2012-12-13 MED ORDER — AZACITIDINE CHEMO SQ INJECTION
75.0000 mg/m2 | Freq: Once | INTRAMUSCULAR | Status: AC
  Administered 2012-12-13: 150 mg via SUBCUTANEOUS
  Filled 2012-12-13: qty 30

## 2012-12-13 MED ORDER — ONDANSETRON HCL 8 MG PO TABS
8.0000 mg | ORAL_TABLET | Freq: Once | ORAL | Status: AC
  Administered 2012-12-13: 8 mg via ORAL

## 2012-12-13 NOTE — Progress Notes (Signed)
OK to treat today per Dr. Gaylyn Rong.  Vidaza only, no blood-dhp, rn

## 2012-12-13 NOTE — Telephone Encounter (Signed)
I have called and moved the appt for 12/24 to an earlier time due to closing early. Patient given appt.   JMW

## 2012-12-13 NOTE — Patient Instructions (Signed)
[facility] Discharge Instructions for Patients Receiving Chemotherapy  Today you received the following chemotherapy agents Vidaza  To help prevent nausea and vomiting after your treatment, we encourage you to take your nausea medication as prescribed.   If you develop nausea and vomiting that is not controlled by your nausea medication, call the clinic. If it is after clinic hours your family physician or the after hours number for the clinic or go to the Emergency Department.   BELOW ARE SYMPTOMS THAT SHOULD BE REPORTED IMMEDIATELY:  *FEVER GREATER THAN 100.5 F  *CHILLS WITH OR WITHOUT FEVER  NAUSEA AND VOMITING THAT IS NOT CONTROLLED WITH YOUR NAUSEA MEDICATION  *UNUSUAL SHORTNESS OF BREATH  *UNUSUAL BRUISING OR BLEEDING  TENDERNESS IN MOUTH AND THROAT WITH OR WITHOUT PRESENCE OF ULCERS  *URINARY PROBLEMS  *BOWEL PROBLEMS  UNUSUAL RASH Items with * indicate a potential emergency and should be followed up as soon as possible.  Feel free to call the clinic you have any questions or concerns. The clinic phone number is [phone].   I have been informed and understand all the instructions given to me. I know to contact the clinic, my physician, or go to the Emergency Department if any problems should occur. I do not have any questions at this time, but understand that I may call the clinic during office hours   should I have any questions or need assistance in obtaining follow up care.    

## 2012-12-16 ENCOUNTER — Ambulatory Visit (HOSPITAL_BASED_OUTPATIENT_CLINIC_OR_DEPARTMENT_OTHER): Payer: Medicare Other

## 2012-12-16 VITALS — BP 107/64 | HR 70 | Temp 97.5°F | Resp 20

## 2012-12-16 DIAGNOSIS — C9200 Acute myeloblastic leukemia, not having achieved remission: Secondary | ICD-10-CM

## 2012-12-16 DIAGNOSIS — Z5111 Encounter for antineoplastic chemotherapy: Secondary | ICD-10-CM

## 2012-12-16 DIAGNOSIS — D46Z Other myelodysplastic syndromes: Secondary | ICD-10-CM

## 2012-12-16 MED ORDER — ONDANSETRON HCL 8 MG PO TABS
8.0000 mg | ORAL_TABLET | Freq: Once | ORAL | Status: AC
  Administered 2012-12-16: 8 mg via ORAL

## 2012-12-16 MED ORDER — AZACITIDINE CHEMO SQ INJECTION
75.0000 mg/m2 | Freq: Once | INTRAMUSCULAR | Status: AC
  Administered 2012-12-16: 150 mg via SUBCUTANEOUS
  Filled 2012-12-16: qty 30

## 2012-12-16 NOTE — Patient Instructions (Addendum)
[facility] Discharge Instructions for Patients Receiving Chemotherapy  Today you received the following chemotherapy agents :  Vidaza.  To help prevent nausea and vomiting after your treatment, we encourage you to take your nausea medication as instructed by your physician.    If you develop nausea and vomiting that is not controlled by your nausea medication, call the clinic. If it is after clinic hours your family physician or the after hours number for the clinic or go to the Emergency Department.   BELOW ARE SYMPTOMS THAT SHOULD BE REPORTED IMMEDIATELY:  *FEVER GREATER THAN 100.5 F  *CHILLS WITH OR WITHOUT FEVER  NAUSEA AND VOMITING THAT IS NOT CONTROLLED WITH YOUR NAUSEA MEDICATION  *UNUSUAL SHORTNESS OF BREATH  *UNUSUAL BRUISING OR BLEEDING  TENDERNESS IN MOUTH AND THROAT WITH OR WITHOUT PRESENCE OF ULCERS  *URINARY PROBLEMS  *BOWEL PROBLEMS  UNUSUAL RASH Items with * indicate a potential emergency and should be followed up as soon as possible.  One of the nurses will contact you 24 hours after your treatment. Please let the nurse know about any problems that you may have experienced. Feel free to call the clinic you have any questions or concerns. The clinic phone number is [phone].   I have been informed and understand all the instructions given to me. I know to contact the clinic, my physician, or go to the Emergency Department if any problems should occur. I do not have any questions at this time, but understand that I may call the clinic during office hours   should I have any questions or need assistance in obtaining follow up care.    __________________________________________  _____________  __________ Signature of Patient or Authorized Representative            Date                   Time    __________________________________________ Nurse's Signature    

## 2012-12-16 NOTE — Progress Notes (Signed)
Per Belenda Cruise, NP -  OK to treat today with lab results from 12/13/12.

## 2012-12-17 ENCOUNTER — Ambulatory Visit (HOSPITAL_BASED_OUTPATIENT_CLINIC_OR_DEPARTMENT_OTHER): Payer: Medicare Other

## 2012-12-17 VITALS — BP 117/58 | HR 69 | Temp 98.0°F

## 2012-12-17 DIAGNOSIS — Z5111 Encounter for antineoplastic chemotherapy: Secondary | ICD-10-CM

## 2012-12-17 DIAGNOSIS — C9200 Acute myeloblastic leukemia, not having achieved remission: Secondary | ICD-10-CM

## 2012-12-17 DIAGNOSIS — D46Z Other myelodysplastic syndromes: Secondary | ICD-10-CM

## 2012-12-17 MED ORDER — ONDANSETRON HCL 8 MG PO TABS
8.0000 mg | ORAL_TABLET | Freq: Once | ORAL | Status: AC
  Administered 2012-12-17: 8 mg via ORAL

## 2012-12-17 MED ORDER — AZACITIDINE CHEMO SQ INJECTION
75.0000 mg/m2 | Freq: Once | INTRAMUSCULAR | Status: AC
  Administered 2012-12-17: 150 mg via SUBCUTANEOUS
  Filled 2012-12-17: qty 30

## 2012-12-17 NOTE — Patient Instructions (Addendum)
[facility] Discharge Instructions for Patients Receiving Chemotherapy  Today you received the following chemotherapy agents Vidaza  To help prevent nausea and vomiting after your treatment, we encourage you to take your nausea medication as prescribed.   If you develop nausea and vomiting that is not controlled by your nausea medication, call the clinic. If it is after clinic hours your family physician or the after hours number for the clinic or go to the Emergency Department.   BELOW ARE SYMPTOMS THAT SHOULD BE REPORTED IMMEDIATELY:  *FEVER GREATER THAN 100.5 F  *CHILLS WITH OR WITHOUT FEVER  NAUSEA AND VOMITING THAT IS NOT CONTROLLED WITH YOUR NAUSEA MEDICATION  *UNUSUAL SHORTNESS OF BREATH  *UNUSUAL BRUISING OR BLEEDING  TENDERNESS IN MOUTH AND THROAT WITH OR WITHOUT PRESENCE OF ULCERS  *URINARY PROBLEMS  *BOWEL PROBLEMS  UNUSUAL RASH Items with * indicate a potential emergency and should be followed up as soon as possible.  Feel free to call the clinic you have any questions or concerns. The clinic phone number is [phone].   I have been informed and understand all the instructions given to me. I know to contact the clinic, my physician, or go to the Emergency Department if any problems should occur. I do not have any questions at this time, but understand that I may call the clinic during office hours   should I have any questions or need assistance in obtaining follow up care.    

## 2012-12-20 ENCOUNTER — Other Ambulatory Visit (HOSPITAL_BASED_OUTPATIENT_CLINIC_OR_DEPARTMENT_OTHER): Payer: Medicare Other | Admitting: Lab

## 2012-12-20 DIAGNOSIS — C9200 Acute myeloblastic leukemia, not having achieved remission: Secondary | ICD-10-CM

## 2012-12-20 LAB — CBC WITH DIFFERENTIAL/PLATELET
Basophils Absolute: 0 10*3/uL (ref 0.0–0.1)
EOS%: 2 % (ref 0.0–7.0)
HCT: 27 % — ABNORMAL LOW (ref 38.4–49.9)
HGB: 9.2 g/dL — ABNORMAL LOW (ref 13.0–17.1)
MCH: 33.6 pg — ABNORMAL HIGH (ref 27.2–33.4)
MCV: 98.5 fL — ABNORMAL HIGH (ref 79.3–98.0)
MONO%: 1 % (ref 0.0–14.0)
NEUT%: 6.5 % — ABNORMAL LOW (ref 39.0–75.0)
Platelets: 35 10*3/uL — ABNORMAL LOW (ref 140–400)

## 2013-01-04 ENCOUNTER — Other Ambulatory Visit: Payer: Self-pay | Admitting: Oncology

## 2013-01-06 ENCOUNTER — Telehealth: Payer: Self-pay | Admitting: *Deleted

## 2013-01-06 ENCOUNTER — Ambulatory Visit (HOSPITAL_BASED_OUTPATIENT_CLINIC_OR_DEPARTMENT_OTHER): Payer: Medicare Other | Admitting: Oncology

## 2013-01-06 ENCOUNTER — Ambulatory Visit (HOSPITAL_BASED_OUTPATIENT_CLINIC_OR_DEPARTMENT_OTHER): Payer: Medicare Other

## 2013-01-06 ENCOUNTER — Other Ambulatory Visit (HOSPITAL_BASED_OUTPATIENT_CLINIC_OR_DEPARTMENT_OTHER): Payer: Medicare Other | Admitting: Lab

## 2013-01-06 ENCOUNTER — Telehealth: Payer: Self-pay | Admitting: Oncology

## 2013-01-06 VITALS — BP 132/64 | HR 66 | Temp 97.1°F | Resp 18 | Ht 70.0 in | Wt 193.7 lb

## 2013-01-06 DIAGNOSIS — C9200 Acute myeloblastic leukemia, not having achieved remission: Secondary | ICD-10-CM

## 2013-01-06 DIAGNOSIS — D46Z Other myelodysplastic syndromes: Secondary | ICD-10-CM

## 2013-01-06 DIAGNOSIS — Z5111 Encounter for antineoplastic chemotherapy: Secondary | ICD-10-CM

## 2013-01-06 DIAGNOSIS — D61818 Other pancytopenia: Secondary | ICD-10-CM

## 2013-01-06 DIAGNOSIS — M810 Age-related osteoporosis without current pathological fracture: Secondary | ICD-10-CM

## 2013-01-06 LAB — CBC WITH DIFFERENTIAL/PLATELET
BASO%: 4.9 % — ABNORMAL HIGH (ref 0.0–2.0)
Eosinophils Absolute: 0 10*3/uL (ref 0.0–0.5)
HCT: 24.5 % — ABNORMAL LOW (ref 38.4–49.9)
LYMPH%: 88.3 % — ABNORMAL HIGH (ref 14.0–49.0)
MCHC: 34 g/dL (ref 32.0–36.0)
MCV: 102.8 fL — ABNORMAL HIGH (ref 79.3–98.0)
MONO%: 1.1 % (ref 0.0–14.0)
NEUT%: 5.3 % — ABNORMAL LOW (ref 39.0–75.0)
Platelets: 40 10*3/uL — ABNORMAL LOW (ref 140–400)
RBC: 2.39 10*6/uL — ABNORMAL LOW (ref 4.20–5.82)

## 2013-01-06 LAB — COMPREHENSIVE METABOLIC PANEL (CC13)
Alkaline Phosphatase: 58 U/L (ref 40–150)
Creatinine: 1.7 mg/dL — ABNORMAL HIGH (ref 0.7–1.3)
Glucose: 158 mg/dl — ABNORMAL HIGH (ref 70–99)
Sodium: 141 mEq/L (ref 136–145)
Total Bilirubin: 0.45 mg/dL (ref 0.20–1.20)
Total Protein: 6 g/dL — ABNORMAL LOW (ref 6.4–8.3)

## 2013-01-06 MED ORDER — ONDANSETRON HCL 8 MG PO TABS
8.0000 mg | ORAL_TABLET | Freq: Once | ORAL | Status: AC
  Administered 2013-01-06: 8 mg via ORAL

## 2013-01-06 MED ORDER — AZACITIDINE CHEMO SQ INJECTION
75.0000 mg/m2 | Freq: Once | INTRAMUSCULAR | Status: AC
  Administered 2013-01-06: 150 mg via SUBCUTANEOUS
  Filled 2013-01-06: qty 30

## 2013-01-06 NOTE — Progress Notes (Signed)
Per Dr Gaylyn Rong it is okay to treat pt today with velcade and cbc results

## 2013-01-06 NOTE — Telephone Encounter (Signed)
Schedule pt for Jan thru March...emailed michelle to add tx...the patient will come back this week to pick up completed schedule

## 2013-01-06 NOTE — Progress Notes (Signed)
Vidaza given Subcutaneously  in 3 sites right lower abdomen.

## 2013-01-06 NOTE — Telephone Encounter (Signed)
Per staff message and POF I have scheduled appts.  JMW  

## 2013-01-06 NOTE — Patient Instructions (Addendum)
[facility] Discharge Instructions for Patients Receiving Chemotherapy  Today you received the following chemotherapy agents vidaza  To help prevent nausea and vomiting after your treatment, we encourage you to take your nausea medication      If you develop nausea and vomiting that is not controlled by your nausea medication, call the clinic. If it is after clinic hours your family physician or the after hours number for the clinic or go to the Emergency Department.   BELOW ARE SYMPTOMS THAT SHOULD BE REPORTED IMMEDIATELY:  *FEVER GREATER THAN 100.5 F  *CHILLS WITH OR WITHOUT FEVER  NAUSEA AND VOMITING THAT IS NOT CONTROLLED WITH YOUR NAUSEA MEDICATION  *UNUSUAL SHORTNESS OF BREATH  *UNUSUAL BRUISING OR BLEEDING  TENDERNESS IN MOUTH AND THROAT WITH OR WITHOUT PRESENCE OF ULCERS  *URINARY PROBLEMS  *BOWEL PROBLEMS  UNUSUAL RASH Items with * indicate a potential emergency and should be followed up as soon as possible.   . The clinic phone number is [phone].   I have been informed and understand all the instructions given to me. I know to contact the clinic, my physician, or go to the Emergency Department if any problems should occur. I do not have any questions at this time, but understand that I may call the clinic during office hours   should I have any questions or need assistance in obtaining follow up care.    __________________________________________  _____________  __________ Signature of Patient or Authorized Representative            Date                   Time    __________________________________________ Nurse's Signature

## 2013-01-06 NOTE — Progress Notes (Signed)
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   PAST THERAPY: Biopsy only   CURRENT THERAPY:  started on 11/11/2012 Vidaza (hypomethylating chemo) d1-5, d8-9; of every 4 weeks cycle.   INTERVAL HISTORY: Kyle Brewer 79 y.o. male returns for regular follow up with his son.  He reported feeling well.  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  He had some fatigue afterward.  However, he still lives by himself and is quite independent.  He has not required blood transfusion.  He denied visible source of bleeding.  Patient denied fever, anorexia, weight loss, fatigue, headache, visual changes, confusion, drenching night sweats, palpable lymph node swelling, mucositis, odynophagia, dysphagia, nausea vomiting, jaundice, chest pain, palpitation, shortness of breath, dyspnea on exertion, productive cough, gum bleeding, epistaxis, hematemesis, hemoptysis, abdominal pain, abdominal swelling, early satiety, melena, hematochezia, hematuria, skin rash, spontaneous bleeding, joint swelling, joint pain, heat or cold intolerance, bowel bladder incontinence, back pain, focal motor weakness, paresthesia, depression.     Past Medical History  Diagnosis Date  . Inguinal hernia     right  . Osteoporosis   . Hypothyroid   . Hyperlipidemia   . Arthritis   . BPH (benign prostatic hyperplasia)   . Wound, open, elbow     Right, followed [facility]  . Hypertension   . CAD (coronary artery disease)     3V CABG 1995, relook cath 2001 with patent grafts. normal stress test 2010.   . Back pain   . CKD (chronic kidney disease), stage I   . Bursitis   . Pancytopenia   . Kidney stone   . 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 MDS (myelodysplastic syndrome), high grade     Past  Surgical History  Procedure Date  . Appendectomy 1968  . Coronary artery bypass graft 1995  . Total knee arthroplasty 2007    right  . Elbow arthroscopy 2012    right  . Transurethral resection of prostate     Dr. Isabel Caprice  . Hernia repair 11/07/11    lap bilateral inguinal    Current Outpatient Prescriptions  Medication Sig Dispense Refill  . alendronate (FOSAMAX) 70 MG tablet Take 70 mg by mouth every 7 (seven) days. Take with a full glass of water on an empty stomach.       Marland Kitchen amLODipine (NORVASC) 5 MG tablet Take 1 tablet (5 mg total) by mouth daily.  90 tablet  2  . Ascorbic Acid (VITAMIN C PO) Take by mouth daily.       Marland Kitchen aspirin 81 MG tablet Take 81 mg by mouth daily.        Marland Kitchen CALCIUM PO Take 600 mg by mouth 2 (two) times daily. Plus Vit D      . Cyanocobalamin (VITAMIN B12 PO) Take 1,000 mcg by mouth daily.       Marland Kitchen dutasteride (AVODART) 0.5 MG capsule Take 0.5 mg by mouth daily.        Marland Kitchen levothyroxine (SYNTHROID, LEVOTHROID) 50 MCG tablet Take 50 mcg by mouth daily.        Marland Kitchen lidocaine-prilocaine (EMLA) cream Apply topically as needed.  30 g  2  . lisinopril (PRINIVIL,ZESTRIL) 10 MG tablet Take 10 mg by mouth daily.      Marland Kitchen  LORazepam (ATIVAN) 0.5 MG tablet Take 0.5 mg by mouth at bedtime as needed.      . meloxicam (MOBIC) 7.5 MG tablet Take 7.5 mg by mouth 2 (two) times daily.       . metoprolol succinate (TOPROL XL) 100 MG 24 hr tablet Take 1 tablet (100 mg total) by mouth daily.  90 tablet  2  . Multiple Vitamin (ONE-A-DAY MENS PO) Take by mouth daily.       . simvastatin (ZOCOR) 20 MG tablet Take 20 mg by mouth at bedtime.        Marland Kitchen terazosin (HYTRIN) 10 MG capsule Take 10 mg by mouth at bedtime.          ALLERGIES:   has no known allergies.  REVIEW OF SYSTEMS:  The rest of the 14-point review of system was negative.   Filed Vitals:   01/06/13 1355  BP: 132/64  Pulse: 66  Temp: 97.1 F (36.2 C)  Resp: 18   Wt Readings from Last 3 Encounters:  01/06/13 193 lb 11.2  oz (87.862 kg)  12/09/12 195 lb 9.6 oz (88.724 kg)  11/18/12 191 lb 14.4 oz (87.045 kg)   ECOG Performance status: 0-1  PHYSICAL EXAMINATION:   General:  well-nourished man, in no acute distress.  Eyes:  no scleral icterus.  ENT:  There were no oropharyngeal lesions.  Neck was without thyromegaly.  Lymphatics:  Negative cervical, supraclavicular or axillary adenopathy.  Respiratory: lungs were clear bilaterally without wheezing or crackles.  Cardiovascular:  Regular rate and rhythm, S1/S2, without rub or gallop. There was a II/VI systolic murmur best heard in the left upper sternal border. There was no pedal edema.  GI:  abdomen was soft, flat, nontender, nondistended, without organomegaly.  Muscoloskeletal:  no spinal tenderness of palpation of vertebral spine.  Skin exam was without petichae. There was some ecchymosis on the skin over the abdomen where he had Vidaza injection last week.  There was pain, erythema to palpation.  Neuro exam was nonfocal.  Patient was able to get on and off exam table without assistance.  Gait was normal.  Patient was alerted and oriented.  Attention was good.   Language was appropriate.  Mood was normal without depression.  Speech was not pressured.  Thought content was not tangential.     LABORATORY/RADIOLOGY DATA:  Lab Results  Component Value Date   WBC 1.3* 01/06/2013   HGB 8.3* 01/06/2013   HCT 24.5* 01/06/2013   PLT 40* 01/06/2013   GLUCOSE 75 12/09/2012   ALKPHOS 60 12/09/2012   ALT 18 12/09/2012   AST 16 12/09/2012   NA 144 12/09/2012   K 4.4 12/09/2012   CL 111* 12/09/2012   CREATININE 1.6* 12/09/2012   BUN 31.0* 12/09/2012   CO2 27 12/09/2012   INR 1.06 11/04/2012     ASSESSMENT AND PLAN:   1. Hypertension: He is on amlodipine, lisinopril, metoprolol.  2. Hyperlipidemia: He is on simvastatin.  3. CAD: He is on ASA, simvastatin, lisinopril, and metoprolol.  4. Hypothyroidism: He is on Synthroid.  5. BPH: He is on dutasteride and terazosin.    6. Osteoporosis: He is on Fosamax.  7. 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  His CBC are still low; but he has not required transfusion.  - Recommendation:  Proceed with cycles #3 today.   Consider bone marrow biopsy after the 4th cycle to assess response.  -  Follow up:  Lab weekly to see if blood transfusion is indicated.  Return visit with Belenda Cruise in about 4 weeks.  He expressed informed understanding and wished to proceed with therapy as recommended.   8.  Pancytopenia:  Due to MDS/AML.  There is no active bleeding.  There is no indication for transfusion today.  Goal of transfusion Hgb <7 or 8; Plt <10K or active bleeding.   9 . Code status: FULL CODE.

## 2013-01-06 NOTE — Patient Instructions (Addendum)
1.  Diagnosis:  Myelodysplastic Syndrome (MDS). 2.  Treatment:  Vidaza injection d1-7 every 4 weeks.  3.  Recommendation:  Proceed with cycles # today.   Consider bone marrow biopsy after the 4th cycle to assess response.  4.  Follow up:  Lab weekly to see if blood transfusion is indicated.  Return visit with Belenda Cruise in about 4 weeks.

## 2013-01-07 ENCOUNTER — Ambulatory Visit (HOSPITAL_BASED_OUTPATIENT_CLINIC_OR_DEPARTMENT_OTHER): Payer: Medicare Other

## 2013-01-07 VITALS — BP 106/58 | HR 60 | Temp 97.2°F | Resp 18

## 2013-01-07 DIAGNOSIS — D46Z Other myelodysplastic syndromes: Secondary | ICD-10-CM

## 2013-01-07 DIAGNOSIS — C9200 Acute myeloblastic leukemia, not having achieved remission: Secondary | ICD-10-CM

## 2013-01-07 DIAGNOSIS — Z5111 Encounter for antineoplastic chemotherapy: Secondary | ICD-10-CM

## 2013-01-07 MED ORDER — AZACITIDINE CHEMO SQ INJECTION
75.0000 mg/m2 | Freq: Once | INTRAMUSCULAR | Status: AC
  Administered 2013-01-07: 150 mg via SUBCUTANEOUS
  Filled 2013-01-07: qty 30

## 2013-01-07 MED ORDER — ONDANSETRON HCL 8 MG PO TABS
8.0000 mg | ORAL_TABLET | Freq: Once | ORAL | Status: AC
  Administered 2013-01-07: 8 mg via ORAL

## 2013-01-07 NOTE — Patient Instructions (Signed)
[facility] Discharge Instructions for Patients Receiving Chemotherapy  Today you received the following chemotherapy agents Vidaza    If you develop nausea and vomiting that is not controlled by your nausea medication, call the clinic. If it is after clinic hours your family physician or the after hours number for the clinic or go to the Emergency Department.   BELOW ARE SYMPTOMS THAT SHOULD BE REPORTED IMMEDIATELY:  *FEVER GREATER THAN 100.5 F  *CHILLS WITH OR WITHOUT FEVER  NAUSEA AND VOMITING THAT IS NOT CONTROLLED WITH YOUR NAUSEA MEDICATION  *UNUSUAL SHORTNESS OF BREATH  *UNUSUAL BRUISING OR BLEEDING  TENDERNESS IN MOUTH AND THROAT WITH OR WITHOUT PRESENCE OF ULCERS  *URINARY PROBLEMS  *BOWEL PROBLEMS  UNUSUAL RASH Items with * indicate a potential emergency and should be followed up as soon as possible.  One of the nurses will contact you 24 hours after your treatment. Please let the nurse know about any problems that you may have experienced. Feel free to call the clinic you have any questions or concerns. The clinic phone number is [phone].   I have been informed and understand all the instructions given to me. I know to contact the clinic, my physician, or go to the Emergency Department if any problems should occur. I do not have any questions at this time, but understand that I may call the clinic during office hours   should I have any questions or need assistance in obtaining follow up care.    __________________________________________  _____________  __________ Signature of Patient or Authorized Representative            Date                   Time    __________________________________________ Nurse's Signature    

## 2013-01-08 ENCOUNTER — Ambulatory Visit (HOSPITAL_BASED_OUTPATIENT_CLINIC_OR_DEPARTMENT_OTHER): Payer: Medicare Other

## 2013-01-08 VITALS — BP 115/53 | HR 60 | Temp 98.1°F

## 2013-01-08 DIAGNOSIS — C9200 Acute myeloblastic leukemia, not having achieved remission: Secondary | ICD-10-CM

## 2013-01-08 DIAGNOSIS — Z5111 Encounter for antineoplastic chemotherapy: Secondary | ICD-10-CM

## 2013-01-08 DIAGNOSIS — D46Z Other myelodysplastic syndromes: Secondary | ICD-10-CM

## 2013-01-08 MED ORDER — ONDANSETRON HCL 8 MG PO TABS
8.0000 mg | ORAL_TABLET | Freq: Once | ORAL | Status: AC
  Administered 2013-01-08: 8 mg via ORAL

## 2013-01-08 MED ORDER — AZACITIDINE CHEMO SQ INJECTION
75.0000 mg/m2 | Freq: Once | INTRAMUSCULAR | Status: AC
  Administered 2013-01-08: 150 mg via SUBCUTANEOUS
  Filled 2013-01-08: qty 30

## 2013-01-08 NOTE — Patient Instructions (Addendum)
[facility] Discharge Instructions for Patients Receiving Chemotherapy  Today you received the following chemotherapy agents Vidaza.  To help prevent nausea and vomiting after your treatment, we encourage you to take your nausea medication as ordered per MD.    If you develop nausea and vomiting that is not controlled by your nausea medication, call the clinic. If it is after clinic hours your family physician or the after hours number for the clinic or go to the Emergency Department.   BELOW ARE SYMPTOMS THAT SHOULD BE REPORTED IMMEDIATELY:  *FEVER GREATER THAN 100.5 F  *CHILLS WITH OR WITHOUT FEVER  NAUSEA AND VOMITING THAT IS NOT CONTROLLED WITH YOUR NAUSEA MEDICATION  *UNUSUAL SHORTNESS OF BREATH  *UNUSUAL BRUISING OR BLEEDING  TENDERNESS IN MOUTH AND THROAT WITH OR WITHOUT PRESENCE OF ULCERS  *URINARY PROBLEMS  *BOWEL PROBLEMS  UNUSUAL RASH Items with * indicate a potential emergency and should be followed up as soon as possible.   Please let the nurse know about any problems that you may have experienced. Feel free to call the clinic you have any questions or concerns. The clinic phone number is [phone].   I have been informed and understand all the instructions given to me. I know to contact the clinic, my physician, or go to the Emergency Department if any problems should occur. I do not have any questions at this time, but understand that I may call the clinic during office hours   should I have any questions or need assistance in obtaining follow up care.

## 2013-01-09 ENCOUNTER — Ambulatory Visit (HOSPITAL_BASED_OUTPATIENT_CLINIC_OR_DEPARTMENT_OTHER): Payer: Medicare Other

## 2013-01-09 VITALS — BP 98/50 | HR 76 | Temp 98.1°F

## 2013-01-09 DIAGNOSIS — C9200 Acute myeloblastic leukemia, not having achieved remission: Secondary | ICD-10-CM

## 2013-01-09 DIAGNOSIS — Z5111 Encounter for antineoplastic chemotherapy: Secondary | ICD-10-CM

## 2013-01-09 DIAGNOSIS — D46Z Other myelodysplastic syndromes: Secondary | ICD-10-CM

## 2013-01-09 MED ORDER — AZACITIDINE CHEMO SQ INJECTION
75.0000 mg/m2 | Freq: Once | INTRAMUSCULAR | Status: AC
  Administered 2013-01-09: 150 mg via SUBCUTANEOUS
  Filled 2013-01-09: qty 30

## 2013-01-09 MED ORDER — ONDANSETRON HCL 8 MG PO TABS
8.0000 mg | ORAL_TABLET | Freq: Once | ORAL | Status: AC
  Administered 2013-01-09: 8 mg via ORAL

## 2013-01-09 NOTE — Patient Instructions (Signed)
[facility] Discharge Instructions for Patients Receiving Chemotherapy  Today you received the following chemotherapy agents: Vidaza. To help prevent nausea and vomiting after your treatment, we encourage you to take your nausea medication as needed. If you develop nausea and vomiting that is not controlled by your nausea medication, call the clinic. If it is after clinic hours your family physician or the after hours number for the clinic or go to the Emergency Department.   BELOW ARE SYMPTOMS THAT SHOULD BE REPORTED IMMEDIATELY:  *FEVER GREATER THAN 100.5 F  *CHILLS WITH OR WITHOUT FEVER  NAUSEA AND VOMITING THAT IS NOT CONTROLLED WITH YOUR NAUSEA MEDICATION  *UNUSUAL SHORTNESS OF BREATH  *UNUSUAL BRUISING OR BLEEDING  TENDERNESS IN MOUTH AND THROAT WITH OR WITHOUT PRESENCE OF ULCERS  *URINARY PROBLEMS  *BOWEL PROBLEMS  UNUSUAL RASH Items with * indicate a potential emergency and should be followed up as soon as possible.  One of the nurses will contact you 24 hours after your treatment. Please let the nurse know about any problems that you may have experienced. Feel free to call the clinic you have any questions or concerns. The clinic phone number is [phone].   I have been informed and understand all the instructions given to me. I know to contact the clinic, my physician, or go to the Emergency Department if any problems should occur. I do not have any questions at this time, but understand that I may call the clinic during office hours   should I have any questions or need assistance in obtaining follow up care.

## 2013-01-10 ENCOUNTER — Other Ambulatory Visit: Payer: Medicare Other | Admitting: Lab

## 2013-01-10 ENCOUNTER — Ambulatory Visit (HOSPITAL_BASED_OUTPATIENT_CLINIC_OR_DEPARTMENT_OTHER): Payer: Medicare Other

## 2013-01-10 VITALS — BP 133/68 | HR 70 | Temp 98.3°F | Resp 18

## 2013-01-10 DIAGNOSIS — C9200 Acute myeloblastic leukemia, not having achieved remission: Secondary | ICD-10-CM

## 2013-01-10 DIAGNOSIS — D46Z Other myelodysplastic syndromes: Secondary | ICD-10-CM

## 2013-01-10 DIAGNOSIS — Z5111 Encounter for antineoplastic chemotherapy: Secondary | ICD-10-CM

## 2013-01-10 LAB — CBC WITH DIFFERENTIAL/PLATELET
Basophils Absolute: 0 10*3/uL (ref 0.0–0.1)
EOS%: 0.6 % (ref 0.0–7.0)
HCT: 24.7 % — ABNORMAL LOW (ref 38.4–49.9)
HGB: 8 g/dL — ABNORMAL LOW (ref 13.0–17.1)
LYMPH%: 83.1 % — ABNORMAL HIGH (ref 14.0–49.0)
MCH: 33.3 pg (ref 27.2–33.4)
MCV: 102.9 fL — ABNORMAL HIGH (ref 79.3–98.0)
MONO%: 2.6 % (ref 0.0–14.0)
NEUT%: 13.7 % — ABNORMAL LOW (ref 39.0–75.0)
Platelets: 33 10*3/uL — ABNORMAL LOW (ref 140–400)
lymph#: 1.3 10*3/uL (ref 0.9–3.3)

## 2013-01-10 MED ORDER — ONDANSETRON HCL 8 MG PO TABS
8.0000 mg | ORAL_TABLET | Freq: Once | ORAL | Status: AC
  Administered 2013-01-10: 8 mg via ORAL

## 2013-01-10 MED ORDER — AZACITIDINE CHEMO SQ INJECTION
75.0000 mg/m2 | Freq: Once | INTRAMUSCULAR | Status: AC
  Administered 2013-01-10: 150 mg via SUBCUTANEOUS
  Filled 2013-01-10: qty 30

## 2013-01-10 NOTE — Patient Instructions (Signed)
[facility] Discharge Instructions for Patients Receiving Chemotherapy  Today you received the following chemotherapy agents Vidzaz     If you develop nausea and vomiting that is not controlled by your nausea medication, call the clinic. If it is after clinic hours your family physician or the after hours number for the clinic or go to the Emergency Department.   BELOW ARE SYMPTOMS THAT SHOULD BE REPORTED IMMEDIATELY:  *FEVER GREATER THAN 100.5 F  *CHILLS WITH OR WITHOUT FEVER  NAUSEA AND VOMITING THAT IS NOT CONTROLLED WITH YOUR NAUSEA MEDICATION  *UNUSUAL SHORTNESS OF BREATH  *UNUSUAL BRUISING OR BLEEDING  TENDERNESS IN MOUTH AND THROAT WITH OR WITHOUT PRESENCE OF ULCERS  *URINARY PROBLEMS  *BOWEL PROBLEMS  UNUSUAL RASH Items with * indicate a potential emergency and should be followed up as soon as possible.  One of the nurses will contact you 24 hours after your treatment. Please let the nurse know about any problems that you may have experienced. Feel free to call the clinic you have any questions or concerns. The clinic phone number is [phone].   I have been informed and understand all the instructions given to me. I know to contact the clinic, my physician, or go to the Emergency Department if any problems should occur. I do not have any questions at this time, but understand that I may call the clinic during office hours   should I have any questions or need assistance in obtaining follow up care.    __________________________________________  _____________  __________ Signature of Patient or Authorized Representative            Date                   Time    __________________________________________ Nurse's Signature

## 2013-01-13 ENCOUNTER — Ambulatory Visit (HOSPITAL_BASED_OUTPATIENT_CLINIC_OR_DEPARTMENT_OTHER): Payer: Medicare Other

## 2013-01-13 ENCOUNTER — Other Ambulatory Visit (HOSPITAL_BASED_OUTPATIENT_CLINIC_OR_DEPARTMENT_OTHER): Payer: Medicare Other | Admitting: Lab

## 2013-01-13 VITALS — BP 141/70 | HR 63 | Temp 98.0°F

## 2013-01-13 DIAGNOSIS — C9200 Acute myeloblastic leukemia, not having achieved remission: Secondary | ICD-10-CM

## 2013-01-13 DIAGNOSIS — D46Z Other myelodysplastic syndromes: Secondary | ICD-10-CM

## 2013-01-13 DIAGNOSIS — Z5111 Encounter for antineoplastic chemotherapy: Secondary | ICD-10-CM

## 2013-01-13 LAB — CBC WITH DIFFERENTIAL/PLATELET
Eosinophils Absolute: 0 10*3/uL (ref 0.0–0.5)
LYMPH%: 88.8 % — ABNORMAL HIGH (ref 14.0–49.0)
MCH: 33.3 pg (ref 27.2–33.4)
MCV: 100.4 fL — ABNORMAL HIGH (ref 79.3–98.0)
MONO%: 0.7 % (ref 0.0–14.0)
NEUT#: 0.1 10*3/uL — CL (ref 1.5–6.5)
Platelets: 44 10*3/uL — ABNORMAL LOW (ref 140–400)
RBC: 2.34 10*6/uL — ABNORMAL LOW (ref 4.20–5.82)

## 2013-01-13 MED ORDER — ONDANSETRON HCL 8 MG PO TABS
8.0000 mg | ORAL_TABLET | Freq: Once | ORAL | Status: AC
  Administered 2013-01-13: 8 mg via ORAL

## 2013-01-13 MED ORDER — AZACITIDINE CHEMO SQ INJECTION
75.0000 mg/m2 | Freq: Once | INTRAMUSCULAR | Status: AC
  Administered 2013-01-13: 150 mg via SUBCUTANEOUS
  Filled 2013-01-13: qty 30

## 2013-01-13 NOTE — Progress Notes (Signed)
5784 Reviewed CBC --OK to treat per Clenton Pare, PA.  1005 Pt stated that he "itches a lot" on his left elbow area.  Noted light red rash to elbow/upper arm area.  Clenton Pare, PA notified and informed pt to use hydrocortisone cream to area.  Pt verbalized understanding of instructions.  Also informed PA that pt declines blood transfusion at this time.

## 2013-01-13 NOTE — Patient Instructions (Signed)
[facility] Discharge Instructions for Patients Receiving Chemotherapy  Today you received the following chemotherapy agents Vidaza.   To help prevent nausea and vomiting after your treatment, we encourage you to take your nausea medication as prescribed.   If you develop nausea and vomiting that is not controlled by your nausea medication, call the clinic. If it is after clinic hours your family physician or the after hours number for the clinic or go to the Emergency Department.   BELOW ARE SYMPTOMS THAT SHOULD BE REPORTED IMMEDIATELY:  *FEVER GREATER THAN 100.5 F  *CHILLS WITH OR WITHOUT FEVER  NAUSEA AND VOMITING THAT IS NOT CONTROLLED WITH YOUR NAUSEA MEDICATION  *UNUSUAL SHORTNESS OF BREATH  *UNUSUAL BRUISING OR BLEEDING  TENDERNESS IN MOUTH AND THROAT WITH OR WITHOUT PRESENCE OF ULCERS  *URINARY PROBLEMS  *BOWEL PROBLEMS  UNUSUAL RASH Items with * indicate a potential emergency and should be followed up as soon as possible.  One of the nurses will contact you 24 hours after your treatment. Please let the nurse know about any problems that you may have experienced. Feel free to call the clinic you have any questions or concerns. The clinic phone number is [phone].

## 2013-01-14 ENCOUNTER — Ambulatory Visit (HOSPITAL_BASED_OUTPATIENT_CLINIC_OR_DEPARTMENT_OTHER): Payer: Medicare Other

## 2013-01-14 DIAGNOSIS — Z5111 Encounter for antineoplastic chemotherapy: Secondary | ICD-10-CM

## 2013-01-14 DIAGNOSIS — D46Z Other myelodysplastic syndromes: Secondary | ICD-10-CM

## 2013-01-14 DIAGNOSIS — C9200 Acute myeloblastic leukemia, not having achieved remission: Secondary | ICD-10-CM

## 2013-01-14 MED ORDER — AZACITIDINE CHEMO SQ INJECTION
75.0000 mg/m2 | Freq: Once | INTRAMUSCULAR | Status: AC
  Administered 2013-01-14: 150 mg via SUBCUTANEOUS
  Filled 2013-01-14: qty 30

## 2013-01-14 MED ORDER — ONDANSETRON HCL 8 MG PO TABS
8.0000 mg | ORAL_TABLET | Freq: Once | ORAL | Status: AC
  Administered 2013-01-14: 8 mg via ORAL

## 2013-01-20 ENCOUNTER — Other Ambulatory Visit (HOSPITAL_BASED_OUTPATIENT_CLINIC_OR_DEPARTMENT_OTHER): Payer: Medicare Other | Admitting: Lab

## 2013-01-20 ENCOUNTER — Telehealth: Payer: Self-pay | Admitting: *Deleted

## 2013-01-20 ENCOUNTER — Other Ambulatory Visit: Payer: Medicare Other | Admitting: Lab

## 2013-01-20 ENCOUNTER — Other Ambulatory Visit: Payer: Self-pay | Admitting: Oncology

## 2013-01-20 ENCOUNTER — Ambulatory Visit: Payer: Medicare Other

## 2013-01-20 ENCOUNTER — Encounter (HOSPITAL_COMMUNITY)
Admission: RE | Admit: 2013-01-20 | Discharge: 2013-01-20 | Disposition: A | Discharge status: Home / Self Care | Payer: Medicare Other | Source: Ambulatory Visit | Attending: Oncology | Admitting: Oncology

## 2013-01-20 ENCOUNTER — Other Ambulatory Visit: Payer: Self-pay | Admitting: *Deleted

## 2013-01-20 VITALS — BP 98/54 | HR 72 | Temp 97.4°F | Resp 18

## 2013-01-20 DIAGNOSIS — D649 Anemia, unspecified: Secondary | ICD-10-CM | POA: Insufficient documentation

## 2013-01-20 DIAGNOSIS — D46Z Other myelodysplastic syndromes: Secondary | ICD-10-CM

## 2013-01-20 DIAGNOSIS — C9200 Acute myeloblastic leukemia, not having achieved remission: Secondary | ICD-10-CM

## 2013-01-20 LAB — CBC WITH DIFFERENTIAL/PLATELET
Basophils Absolute: 0 10*3/uL (ref 0.0–0.1)
EOS%: 1.4 % (ref 0.0–7.0)
Eosinophils Absolute: 0 10*3/uL (ref 0.0–0.5)
HCT: 23.4 % — ABNORMAL LOW (ref 38.4–49.9)
HGB: 7.6 g/dL — ABNORMAL LOW (ref 13.0–17.1)
MCH: 32.9 pg (ref 27.2–33.4)
MCV: 101.3 fL — ABNORMAL HIGH (ref 79.3–98.0)
NEUT%: 16.6 % — ABNORMAL LOW (ref 39.0–75.0)
lymph#: 1.1 10*3/uL (ref 0.9–3.3)

## 2013-01-20 MED ORDER — DIPHENHYDRAMINE HCL 25 MG PO CAPS
25.0000 mg | ORAL_CAPSULE | Freq: Once | ORAL | Status: AC
  Administered 2013-01-20: 25 mg via ORAL

## 2013-01-20 MED ORDER — SODIUM CHLORIDE 0.9 % IV SOLN
250.0000 mL | Freq: Once | INTRAVENOUS | Status: AC
Start: 2013-01-20 — End: 2013-01-20
  Administered 2013-01-20: 250 mL via INTRAVENOUS

## 2013-01-20 MED ORDER — ACETAMINOPHEN 325 MG PO TABS
650.0000 mg | ORAL_TABLET | Freq: Once | ORAL | Status: AC
  Administered 2013-01-20: 650 mg via ORAL

## 2013-01-20 NOTE — Telephone Encounter (Signed)
Called pt regarding CBC results and low hgb.  He states feeling very tired this past week.  Per Belenda Cruise, transfuse one unit blood for symptomatic anemia.  Pt agreed and will return to [facility] at 11 am for type and cross and then transfusion around noon.   Dr. Gaylyn Rong aware.

## 2013-01-20 NOTE — Patient Instructions (Signed)
Blood Transfusion Information WHAT IS A BLOOD TRANSFUSION? A transfusion is the replacement of blood or some of its parts. Blood is made up of multiple cells which provide different functions.  Red blood cells carry oxygen and are used for blood loss replacement.  White blood cells fight against infection.  Platelets control bleeding.  Plasma helps clot blood.  Other blood products are available for specialized needs, such as hemophilia or other clotting disorders. BEFORE THE TRANSFUSION  Who gives blood for transfusions?   You may be able to donate blood to be used at a later date on yourself (autologous donation).  Relatives can be asked to donate blood. This is generally not any safer than if you have received blood from a stranger. The same precautions are taken to ensure safety when a relative's blood is donated.  Healthy volunteers who are fully evaluated to make sure their blood is safe. This is blood bank blood. Transfusion therapy is the safest it has ever been in the practice of medicine. Before blood is taken from a donor, a complete history is taken to make sure that person has no history of diseases nor engages in risky social behavior (examples are intravenous drug use or sexual activity with multiple partners). The donor's travel history is screened to minimize risk of transmitting infections, such as malaria. The donated blood is tested for signs of infectious diseases, such as HIV and hepatitis. The blood is then tested to be sure it is compatible with you in order to minimize the chance of a transfusion reaction. If you or a relative donates blood, this is often done in anticipation of surgery and is not appropriate for emergency situations. It takes many days to process the donated blood. RISKS AND COMPLICATIONS Although transfusion therapy is very safe and saves many lives, the main dangers of transfusion include:   Getting an infectious disease.  Developing a  transfusion reaction. This is an allergic reaction to something in the blood you were given. Every precaution is taken to prevent this. The decision to have a blood transfusion has been considered carefully by your caregiver before blood is given. Blood is not given unless the benefits outweigh the risks. AFTER THE TRANSFUSION  Right after receiving a blood transfusion, you will usually feel much better and more energetic. This is especially true if your red blood cells have gotten low (anemic). The transfusion raises the level of the red blood cells which carry oxygen, and this usually causes an energy increase.  The nurse administering the transfusion will monitor you carefully for complications. HOME CARE INSTRUCTIONS  No special instructions are needed after a transfusion. You may find your energy is better. Speak with your caregiver about any limitations on activity for underlying diseases you may have. SEEK MEDICAL CARE IF:   Your condition is not improving after your transfusion.  You develop redness or irritation at the intravenous (IV) site. SEEK IMMEDIATE MEDICAL CARE IF:  Any of the following symptoms occur over the next 12 hours:  Shaking chills.  You have a temperature by mouth above 102 F (38.9 C), not controlled by medicine.  Chest, back, or muscle pain.  People around you feel you are not acting correctly or are confused.  Shortness of breath or difficulty breathing.  Dizziness and fainting.  You get a rash or develop hives.  You have a decrease in urine output.  Your urine turns a dark color or changes to pink, red, or [place]. Any of the following   symptoms occur over the next 10 days:  You have a temperature by mouth above 102 F (38.9 C), not controlled by medicine.  Shortness of breath.  Weakness after normal activity.  The white part of the eye turns yellow (jaundice).  You have a decrease in the amount of urine or are urinating less often.  Your  urine turns a dark color or changes to pink, red, or [place]. Document Released: 12/08/2000 Document Revised: 03/04/2012 Document Reviewed: 07/27/2008 [employer] Patient Information 2013 [employer].  

## 2013-01-21 LAB — TYPE AND SCREEN
ABO/RH(D): O POS
Unit division: 0

## 2013-01-22 ENCOUNTER — Encounter: Payer: Self-pay | Admitting: *Deleted

## 2013-01-27 ENCOUNTER — Telehealth: Payer: Self-pay | Admitting: *Deleted

## 2013-01-27 ENCOUNTER — Other Ambulatory Visit (HOSPITAL_BASED_OUTPATIENT_CLINIC_OR_DEPARTMENT_OTHER): Payer: Medicare Other | Admitting: Lab

## 2013-01-27 DIAGNOSIS — C9200 Acute myeloblastic leukemia, not having achieved remission: Secondary | ICD-10-CM

## 2013-01-27 DIAGNOSIS — D46Z Other myelodysplastic syndromes: Secondary | ICD-10-CM

## 2013-01-27 LAB — CBC WITH DIFFERENTIAL/PLATELET
Eosinophils Absolute: 0 10*3/uL (ref 0.0–0.5)
MONO#: 0 10*3/uL — ABNORMAL LOW (ref 0.1–0.9)
NEUT#: 0.2 10*3/uL — CL (ref 1.5–6.5)
RBC: 2.68 10*6/uL — ABNORMAL LOW (ref 4.20–5.82)
RDW: 16.7 % — ABNORMAL HIGH (ref 11.0–14.6)
WBC: 1.7 10*3/uL — ABNORMAL LOW (ref 4.0–10.3)
lymph#: 1.4 10*3/uL (ref 0.9–3.3)
nRBC: 0 % (ref 0–0)

## 2013-01-27 NOTE — Telephone Encounter (Signed)
Patient in for lab only appt, Walk-in requesting to speak with nurse regarding rash. Patient states he was having some trouble with itching, likely related to dry skin, started with home treatment around Thursday/friday last week. Rash on shins bilaterally between knees and ankles. Flat, dry, brownish-red rash noted. Patient states it has improved since he went to pharmacy and got some Hydrocortisone cream. Instructed patient to continue to watch, if it worsens, he develops fever, or if rash starts to have any exudate,  he needs to contact primary MD or seek some form of medical attention for further evaluation. Patient verbalized understanding. Labs noted, no transfusion needed at this time, appts as scheduled.

## 2013-02-03 ENCOUNTER — Other Ambulatory Visit: Payer: Self-pay | Admitting: Oncology

## 2013-02-03 ENCOUNTER — Encounter: Payer: Self-pay | Admitting: Oncology

## 2013-02-03 ENCOUNTER — Ambulatory Visit (HOSPITAL_BASED_OUTPATIENT_CLINIC_OR_DEPARTMENT_OTHER): Payer: Medicare Other | Admitting: Oncology

## 2013-02-03 ENCOUNTER — Ambulatory Visit (HOSPITAL_BASED_OUTPATIENT_CLINIC_OR_DEPARTMENT_OTHER): Payer: Medicare Other

## 2013-02-03 ENCOUNTER — Other Ambulatory Visit (HOSPITAL_BASED_OUTPATIENT_CLINIC_OR_DEPARTMENT_OTHER): Payer: Medicare Other | Admitting: Lab

## 2013-02-03 VITALS — BP 117/62 | HR 81 | Temp 96.9°F | Resp 20 | Ht 70.0 in | Wt 189.4 lb

## 2013-02-03 DIAGNOSIS — D61818 Other pancytopenia: Secondary | ICD-10-CM

## 2013-02-03 DIAGNOSIS — M810 Age-related osteoporosis without current pathological fracture: Secondary | ICD-10-CM

## 2013-02-03 DIAGNOSIS — C9200 Acute myeloblastic leukemia, not having achieved remission: Secondary | ICD-10-CM

## 2013-02-03 DIAGNOSIS — Z5111 Encounter for antineoplastic chemotherapy: Secondary | ICD-10-CM

## 2013-02-03 DIAGNOSIS — R300 Dysuria: Secondary | ICD-10-CM

## 2013-02-03 DIAGNOSIS — D46Z Other myelodysplastic syndromes: Secondary | ICD-10-CM

## 2013-02-03 LAB — CBC WITH DIFFERENTIAL/PLATELET
BASO%: 1.5 % (ref 0.0–2.0)
Basophils Absolute: 0 10*3/uL (ref 0.0–0.1)
Eosinophils Absolute: 0 10*3/uL (ref 0.0–0.5)
HCT: 26 % — ABNORMAL LOW (ref 38.4–49.9)
HGB: 8.8 g/dL — ABNORMAL LOW (ref 13.0–17.1)
MCHC: 33.8 g/dL (ref 32.0–36.0)
MONO#: 0 10*3/uL — ABNORMAL LOW (ref 0.1–0.9)
NEUT#: 0.1 10*3/uL — CL (ref 1.5–6.5)
NEUT%: 8.2 % — ABNORMAL LOW (ref 39.0–75.0)
WBC: 1.6 10*3/uL — ABNORMAL LOW (ref 4.0–10.3)
lymph#: 1.4 10*3/uL (ref 0.9–3.3)

## 2013-02-03 LAB — URINALYSIS, MICROSCOPIC - CHCC
Bilirubin (Urine): NEGATIVE
Leukocyte Esterase: NEGATIVE
Nitrite: NEGATIVE
pH: 6 (ref 4.6–8.0)

## 2013-02-03 LAB — COMPREHENSIVE METABOLIC PANEL (CC13)
ALT: 15 U/L (ref 0–55)
CO2: 24 mEq/L (ref 22–29)
Calcium: 9.2 mg/dL (ref 8.4–10.4)
Chloride: 110 mEq/L — ABNORMAL HIGH (ref 98–107)
Creatinine: 1.8 mg/dL — ABNORMAL HIGH (ref 0.7–1.3)

## 2013-02-03 MED ORDER — ONDANSETRON HCL 8 MG PO TABS
8.0000 mg | ORAL_TABLET | Freq: Once | ORAL | Status: AC
  Administered 2013-02-03: 8 mg via ORAL

## 2013-02-03 MED ORDER — AZACITIDINE CHEMO SQ INJECTION
75.0000 mg/m2 | Freq: Once | INTRAMUSCULAR | Status: AC
  Administered 2013-02-03: 150 mg via SUBCUTANEOUS
  Filled 2013-02-03: qty 6

## 2013-02-03 NOTE — Progress Notes (Signed)
New consent signed for Vidaza today

## 2013-02-03 NOTE — Patient Instructions (Signed)
Patient aware of next appointment; discharged home with no complaints. 

## 2013-02-03 NOTE — Progress Notes (Signed)
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   PAST THERAPY: Biopsy only   CURRENT THERAPY:  started on 11/11/2012 Vidaza (hypomethylating chemo) d1-5, d8-9; of every 4 weeks cycle.   INTERVAL HISTORY: Kyle Brewer 79 y.o. male returns for regular follow up with his son.  He reported feeling well.  Received 1 unit PRBC 2 weeks ago. Fatigue improved following transfusion. He still lives by himself and is quite independent. He denied visible source of bleeding. Reports intermittent low back pain and is concerned about possible UTI. Denies hematuria.  Patient denied fever, anorexia, weight loss, fatigue, headache, visual changes, confusion, drenching night sweats, palpable lymph node swelling, mucositis, odynophagia, dysphagia, nausea vomiting, jaundice, chest pain, palpitation, shortness of breath, dyspnea on exertion, productive cough, gum bleeding, epistaxis, hematemesis, hemoptysis, abdominal pain, abdominal swelling, early satiety, melena, hematochezia, hematuria, skin rash, spontaneous bleeding, joint swelling, joint pain, heat or cold intolerance, bowel bladder incontinence, back pain, focal motor weakness, paresthesia, depression.     Past Medical History  Diagnosis Date  . Inguinal hernia     right  . Osteoporosis   . Hypothyroid   . Hyperlipidemia   . Arthritis   . BPH (benign prostatic hyperplasia)   . Wound, open, elbow     Right, followed [facility]  . Hypertension   . CAD (coronary artery disease)     3V CABG 1995, relook cath 2001 with patent grafts. normal stress test 2010.   . Back pain   . CKD (chronic kidney disease), stage I   . Bursitis   . Pancytopenia   . Kidney stone   . 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 MDS (myelodysplastic syndrome), high grade     Past Surgical  History  Procedure Laterality Date  . Appendectomy  1968  . Coronary artery bypass graft  1995  . Total knee arthroplasty  2007    right  . Elbow arthroscopy  2012    right  . Transurethral resection of prostate      Dr. Isabel Caprice  . Hernia repair  11/07/11    lap bilateral inguinal    Current Outpatient Prescriptions  Medication Sig Dispense Refill  . alendronate (FOSAMAX) 70 MG tablet Take 70 mg by mouth every 7 (seven) days. Take with a full glass of water on an empty stomach.       Marland Kitchen amLODipine (NORVASC) 5 MG tablet Take 1 tablet (5 mg total) by mouth daily.  90 tablet  2  . Ascorbic Acid (VITAMIN C PO) Take by mouth daily.       Marland Kitchen aspirin 81 MG tablet Take 81 mg by mouth daily.        Marland Kitchen CALCIUM PO Take 600 mg by mouth 2 (two) times daily. Plus Vit D      . Cyanocobalamin (VITAMIN B12 PO) Take 1,000 mcg by mouth daily.       Marland Kitchen dutasteride (AVODART) 0.5 MG capsule Take 0.5 mg by mouth daily.        Marland Kitchen levothyroxine (SYNTHROID, LEVOTHROID) 50 MCG tablet Take 50 mcg by mouth daily.        Marland Kitchen lidocaine-prilocaine (EMLA) cream Apply topically as needed.  30 g  2  . lisinopril (PRINIVIL,ZESTRIL) 10 MG tablet Take 10 mg by mouth daily.      Marland Kitchen LORazepam (ATIVAN) 0.5  MG tablet Take 0.5 mg by mouth at bedtime as needed.      . meloxicam (MOBIC) 7.5 MG tablet Take 7.5 mg by mouth 2 (two) times daily.       . metoprolol succinate (TOPROL XL) 100 MG 24 hr tablet Take 1 tablet (100 mg total) by mouth daily.  90 tablet  2  . Multiple Vitamin (ONE-A-DAY MENS PO) Take by mouth daily.       . simvastatin (ZOCOR) 20 MG tablet Take 20 mg by mouth at bedtime.        Marland Kitchen terazosin (HYTRIN) 10 MG capsule Take 10 mg by mouth at bedtime.         No current facility-administered medications for this visit.    ALLERGIES:  has No Known Allergies.  REVIEW OF SYSTEMS:  The rest of the 14-point review of system was negative.   Filed Vitals:   02/03/13 0935  BP: 117/62  Pulse: 81  Temp: 96.9 F (36.1 C)   Resp: 20   Wt Readings from Last 3 Encounters:  02/03/13 189 lb 6.4 oz (85.911 kg)  01/06/13 193 lb 11.2 oz (87.862 kg)  12/09/12 195 lb 9.6 oz (88.724 kg)   ECOG Performance status: 0-1  PHYSICAL EXAMINATION:   General:  well-nourished man, in no acute distress.  Eyes:  no scleral icterus.  ENT:  There were no oropharyngeal lesions.  Neck was without thyromegaly.  Lymphatics:  Negative cervical, supraclavicular or axillary adenopathy.  Respiratory: lungs were clear bilaterally without wheezing or crackles.  Cardiovascular:  Regular rate and rhythm, S1/S2, without rub or gallop. There was a II/VI systolic murmur best heard in the left upper sternal border. There was no pedal edema.  GI:  abdomen was soft, flat, nontender, nondistended, without organomegaly.  Muscoloskeletal:  no spinal tenderness of palpation of vertebral spine.  Skin exam was without petichae. There was some ecchymosis on the skin over the abdomen where he had Vidaza injection last week.  There was pain, erythema to palpation.  Neuro exam was nonfocal.  Patient was able to get on and off exam table without assistance.  Gait was normal.  Patient was alerted and oriented.  Attention was good.   Language was appropriate.  Mood was normal without depression.  Speech was not pressured.  Thought content was not tangential.     LABORATORY/RADIOLOGY DATA:  Lab Results  Component Value Date   WBC 1.6* 02/03/2013   HGB 8.8* 02/03/2013   HCT 26.0* 02/03/2013   PLT 54* 02/03/2013   GLUCOSE 103* 02/03/2013   ALKPHOS 68 02/03/2013   ALT 15 02/03/2013   AST 11 02/03/2013   NA 142 02/03/2013   K 4.4 02/03/2013   CL 110* 02/03/2013   CREATININE 1.8* 02/03/2013   BUN 36.9* 02/03/2013   CO2 24 02/03/2013   INR 1.06 11/04/2012     ASSESSMENT AND PLAN:   1. Hypertension: He is on amlodipine, lisinopril, metoprolol.  2. Hyperlipidemia: He is on simvastatin.  3. CAD: He is on ASA, simvastatin, lisinopril, and metoprolol.  4. Hypothyroidism:  He is on Synthroid.  5. BPH: He is on dutasteride and terazosin.  6. Osteoporosis: He is on Fosamax.  7. 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  His CBC are still low; Has only required transfusion with 1 unit PRBC.  - Recommendation:  Proceed with cycle #4 today.   Consider bone marrow biopsy after the  4th cycle to assess response.  - Follow up:  Lab weekly to see if blood transfusion is indicated.  Return visit with Belenda Cruise in about 4 weeks.  He expressed informed understanding and wished to proceed with therapy as recommended.   8.  Pancytopenia:  Due to MDS/AML.  There is no active bleeding.  There is no indication for transfusion today.  Goal of transfusion Hgb <7 or 8; Plt <10K or active bleeding.   9. Dysuria: Will obtain U/A, C&S. No fevers.  10 . Code status: FULL CODE.

## 2013-02-04 ENCOUNTER — Ambulatory Visit (HOSPITAL_BASED_OUTPATIENT_CLINIC_OR_DEPARTMENT_OTHER): Payer: Medicare Other

## 2013-02-04 VITALS — BP 112/65 | HR 67 | Temp 97.6°F | Resp 18

## 2013-02-04 DIAGNOSIS — D46Z Other myelodysplastic syndromes: Secondary | ICD-10-CM

## 2013-02-04 DIAGNOSIS — C9200 Acute myeloblastic leukemia, not having achieved remission: Secondary | ICD-10-CM

## 2013-02-04 DIAGNOSIS — Z5111 Encounter for antineoplastic chemotherapy: Secondary | ICD-10-CM

## 2013-02-04 MED ORDER — AZACITIDINE CHEMO SQ INJECTION
75.0000 mg/m2 | Freq: Once | INTRAMUSCULAR | Status: AC
  Administered 2013-02-04: 150 mg via SUBCUTANEOUS
  Filled 2013-02-04: qty 6

## 2013-02-04 MED ORDER — ONDANSETRON HCL 8 MG PO TABS
8.0000 mg | ORAL_TABLET | Freq: Once | ORAL | Status: AC
  Administered 2013-02-04: 8 mg via ORAL

## 2013-02-04 NOTE — Patient Instructions (Signed)
[facility] Discharge Instructions for Patients Receiving Chemotherapy  Today you received the following chemotherapy agents :  Vidaza.  To help prevent nausea and vomiting after your treatment, we encourage you to take your nausea medication as instructed by your physician.    If you develop nausea and vomiting that is not controlled by your nausea medication, call the clinic. If it is after clinic hours your family physician or the after hours number for the clinic or go to the Emergency Department.   BELOW ARE SYMPTOMS THAT SHOULD BE REPORTED IMMEDIATELY:  *FEVER GREATER THAN 100.5 F  *CHILLS WITH OR WITHOUT FEVER  NAUSEA AND VOMITING THAT IS NOT CONTROLLED WITH YOUR NAUSEA MEDICATION  *UNUSUAL SHORTNESS OF BREATH  *UNUSUAL BRUISING OR BLEEDING  TENDERNESS IN MOUTH AND THROAT WITH OR WITHOUT PRESENCE OF ULCERS  *URINARY PROBLEMS  *BOWEL PROBLEMS  UNUSUAL RASH Items with * indicate a potential emergency and should be followed up as soon as possible.  One of the nurses will contact you 24 hours after your treatment. Please let the nurse know about any problems that you may have experienced. Feel free to call the clinic you have any questions or concerns. The clinic phone number is [phone].   I have been informed and understand all the instructions given to me. I know to contact the clinic, my physician, or go to the Emergency Department if any problems should occur. I do not have any questions at this time, but understand that I may call the clinic during office hours   should I have any questions or need assistance in obtaining follow up care.    __________________________________________  _____________  __________ Signature of Patient or Authorized Representative            Date                   Time    __________________________________________ Nurse's Signature    

## 2013-02-05 ENCOUNTER — Ambulatory Visit (HOSPITAL_BASED_OUTPATIENT_CLINIC_OR_DEPARTMENT_OTHER): Payer: Medicare Other

## 2013-02-05 VITALS — BP 117/57 | HR 73 | Temp 97.9°F

## 2013-02-05 DIAGNOSIS — Z5111 Encounter for antineoplastic chemotherapy: Secondary | ICD-10-CM

## 2013-02-05 DIAGNOSIS — C9200 Acute myeloblastic leukemia, not having achieved remission: Secondary | ICD-10-CM

## 2013-02-05 DIAGNOSIS — D46Z Other myelodysplastic syndromes: Secondary | ICD-10-CM

## 2013-02-05 MED ORDER — ONDANSETRON HCL 8 MG PO TABS
8.0000 mg | ORAL_TABLET | Freq: Once | ORAL | Status: AC
  Administered 2013-02-05: 8 mg via ORAL

## 2013-02-05 MED ORDER — AZACITIDINE CHEMO SQ INJECTION
75.0000 mg/m2 | Freq: Once | INTRAMUSCULAR | Status: AC
  Administered 2013-02-05: 150 mg via SUBCUTANEOUS
  Filled 2013-02-05: qty 6

## 2013-02-05 NOTE — Patient Instructions (Signed)
[facility] Discharge Instructions for Patients Receiving Chemotherapy  Today you received the following chemotherapy agents: Vidaza. To help prevent nausea and vomiting after your treatment, we encourage you to take your nausea medication.  If you develop nausea and vomiting that is not controlled by your nausea medication, call the clinic.  BELOW ARE SYMPTOMS THAT SHOULD BE REPORTED IMMEDIATELY:  *FEVER GREATER THAN 100.5 F  *CHILLS WITH OR WITHOUT FEVER  NAUSEA AND VOMITING THAT IS NOT CONTROLLED WITH YOUR NAUSEA MEDICATION  *UNUSUAL SHORTNESS OF BREATH  *UNUSUAL BRUISING OR BLEEDING  TENDERNESS IN MOUTH AND THROAT WITH OR WITHOUT PRESENCE OF ULCERS  *URINARY PROBLEMS  *BOWEL PROBLEMS  UNUSUAL RASH Items with * indicate a potential emergency and should be followed up as soon as possible.   [phone].

## 2013-02-06 ENCOUNTER — Ambulatory Visit: Payer: Medicare Other

## 2013-02-06 NOTE — Progress Notes (Signed)
Pt called in - will miss appt d/t weather.  Let him know that scheduling will call him to reschedule.

## 2013-02-07 ENCOUNTER — Ambulatory Visit (HOSPITAL_BASED_OUTPATIENT_CLINIC_OR_DEPARTMENT_OTHER): Payer: Medicare Other

## 2013-02-07 VITALS — BP 119/63 | HR 66 | Temp 97.7°F

## 2013-02-07 DIAGNOSIS — Z5111 Encounter for antineoplastic chemotherapy: Secondary | ICD-10-CM

## 2013-02-07 DIAGNOSIS — D46Z Other myelodysplastic syndromes: Secondary | ICD-10-CM

## 2013-02-07 DIAGNOSIS — C9200 Acute myeloblastic leukemia, not having achieved remission: Secondary | ICD-10-CM

## 2013-02-07 MED ORDER — ONDANSETRON HCL 8 MG PO TABS
8.0000 mg | ORAL_TABLET | Freq: Once | ORAL | Status: AC
  Administered 2013-02-07: 8 mg via ORAL

## 2013-02-07 MED ORDER — AZACITIDINE CHEMO SQ INJECTION
75.0000 mg/m2 | Freq: Once | INTRAMUSCULAR | Status: AC
  Administered 2013-02-07: 150 mg via SUBCUTANEOUS
  Filled 2013-02-07: qty 6

## 2013-02-07 NOTE — Patient Instructions (Addendum)
[facility] Discharge Instructions for Patients Receiving Chemotherapy  Today you received the following chemotherapy agents:  Vidaza To help prevent nausea and vomiting after your treatment, we encourage you to take your nausea medicationIf you develop nausea and vomiting that is not controlled by your nausea medication, call the clinic. If it is after clinic hours your family physician or the after hours number for the clinic or go to the Emergency Department.   BELOW ARE SYMPTOMS THAT SHOULD BE REPORTED IMMEDIATELY:  *FEVER GREATER THAN 100.5 F  *CHILLS WITH OR WITHOUT FEVER  NAUSEA AND VOMITING THAT IS NOT CONTROLLED WITH YOUR NAUSEA MEDICATION  *UNUSUAL SHORTNESS OF BREATH  *UNUSUAL BRUISING OR BLEEDING  TENDERNESS IN MOUTH AND THROAT WITH OR WITHOUT PRESENCE OF ULCERS  *URINARY PROBLEMS  *BOWEL PROBLEMS  UNUSUAL RASH Items with * indicate a potential emergency and should be followed up as soon as possible.  . Please let the nurse know about any problems that you may have experienced. Feel free to call the clinic you have any questions or concerns. The clinic phone number is [phone].   I have been informed and understand all the instructions given to me. I know to contact the clinic, my physician, or go to the Emergency Department if any problems should occur. I do not have any questions at this time, but understand that I may call the clinic during office hours   should I have any questions or need assistance in obtaining follow up care.    __________________________________________  _____________  __________ Signature of Patient or Authorized Representative            Date                   Time    __________________________________________ Nurse's Signature

## 2013-02-10 ENCOUNTER — Ambulatory Visit (HOSPITAL_BASED_OUTPATIENT_CLINIC_OR_DEPARTMENT_OTHER): Payer: Medicare Other

## 2013-02-10 ENCOUNTER — Other Ambulatory Visit: Payer: Self-pay | Admitting: *Deleted

## 2013-02-10 ENCOUNTER — Encounter (HOSPITAL_COMMUNITY)
Admission: RE | Admit: 2013-02-10 | Discharge: 2013-02-10 | Disposition: A | Discharge status: Home / Self Care | Payer: Medicare Other | Source: Ambulatory Visit | Attending: Oncology | Admitting: Oncology

## 2013-02-10 ENCOUNTER — Other Ambulatory Visit (HOSPITAL_BASED_OUTPATIENT_CLINIC_OR_DEPARTMENT_OTHER): Payer: Medicare Other

## 2013-02-10 ENCOUNTER — Ambulatory Visit: Payer: Medicare Other | Admitting: Lab

## 2013-02-10 VITALS — BP 134/59 | HR 67 | Temp 97.1°F | Resp 20

## 2013-02-10 DIAGNOSIS — D46Z Other myelodysplastic syndromes: Secondary | ICD-10-CM

## 2013-02-10 DIAGNOSIS — D649 Anemia, unspecified: Secondary | ICD-10-CM

## 2013-02-10 DIAGNOSIS — Z5111 Encounter for antineoplastic chemotherapy: Secondary | ICD-10-CM

## 2013-02-10 DIAGNOSIS — C9200 Acute myeloblastic leukemia, not having achieved remission: Secondary | ICD-10-CM

## 2013-02-10 LAB — CBC WITH DIFFERENTIAL/PLATELET
Basophils Absolute: 0 10*3/uL (ref 0.0–0.1)
Eosinophils Absolute: 0 10*3/uL (ref 0.0–0.5)
HGB: 7.8 g/dL — ABNORMAL LOW (ref 13.0–17.1)
MONO#: 0 10*3/uL — ABNORMAL LOW (ref 0.1–0.9)
NEUT#: 0.2 10*3/uL — CL (ref 1.5–6.5)
RDW: 16.4 % — ABNORMAL HIGH (ref 11.0–14.6)
WBC: 1.3 10*3/uL — ABNORMAL LOW (ref 4.0–10.3)
lymph#: 1.1 10*3/uL (ref 0.9–3.3)
nRBC: 0 % (ref 0–0)

## 2013-02-10 LAB — PREPARE RBC (CROSSMATCH)

## 2013-02-10 LAB — TYPE & CROSSMATCH - CHCC

## 2013-02-10 MED ORDER — AZACITIDINE CHEMO SQ INJECTION
75.0000 mg/m2 | Freq: Once | INTRAMUSCULAR | Status: AC
  Administered 2013-02-10: 150 mg via SUBCUTANEOUS
  Filled 2013-02-10: qty 6

## 2013-02-10 MED ORDER — ONDANSETRON HCL 8 MG PO TABS
8.0000 mg | ORAL_TABLET | Freq: Once | ORAL | Status: AC
  Administered 2013-02-10: 8 mg via ORAL

## 2013-02-10 NOTE — Patient Instructions (Addendum)
[facility] Discharge Instructions for Patients Receiving Chemotherapy  Today you received the following chemotherapy agents vidaza  To help prevent nausea and vomiting after your treatment, we encourage you to take your nausea medication   and take it as often as prescribed.   If you develop nausea and vomiting that is not controlled by your nausea medication, call the clinic. If it is after clinic hours your family physician or the after hours number for the clinic or go to the Emergency Department.   BELOW ARE SYMPTOMS THAT SHOULD BE REPORTED IMMEDIATELY:  *FEVER GREATER THAN 100.5 F  *CHILLS WITH OR WITHOUT FEVER  NAUSEA AND VOMITING THAT IS NOT CONTROLLED WITH YOUR NAUSEA MEDICATION  *UNUSUAL SHORTNESS OF BREATH  *UNUSUAL BRUISING OR BLEEDING  TENDERNESS IN MOUTH AND THROAT WITH OR WITHOUT PRESENCE OF ULCERS  *URINARY PROBLEMS  *BOWEL PROBLEMS  UNUSUAL RASH Items with * indicate a potential emergency and should be followed up as soon as possible.  One of the nurses will contact you 24 hours after your treatment. Please let the nurse know about any problems that you may have experienced. Feel free to call the clinic you have any questions or concerns. The clinic phone number is [phone].   I have been informed and understand all the instructions given to me. I know to contact the clinic, my physician, or go to the Emergency Department if any problems should occur. I do not have any questions at this time, but understand that I may call the clinic during office hours   should I have any questions or need assistance in obtaining follow up care.    __________________________________________  _____________  __________ Signature of Patient or Authorized Representative            Date                   Time    __________________________________________ Nurse's Signature    

## 2013-02-10 NOTE — Progress Notes (Signed)
Ok to tx wit current labs, per c. curico pa.  dmr                        Pt tolerated injections well.      Will return to lab for type &c cross.

## 2013-02-11 ENCOUNTER — Ambulatory Visit (HOSPITAL_BASED_OUTPATIENT_CLINIC_OR_DEPARTMENT_OTHER): Payer: Medicare Other

## 2013-02-11 VITALS — BP 94/49 | HR 67 | Temp 98.2°F | Resp 20

## 2013-02-11 DIAGNOSIS — Z5111 Encounter for antineoplastic chemotherapy: Secondary | ICD-10-CM

## 2013-02-11 DIAGNOSIS — D649 Anemia, unspecified: Secondary | ICD-10-CM

## 2013-02-11 DIAGNOSIS — D46Z Other myelodysplastic syndromes: Secondary | ICD-10-CM

## 2013-02-11 DIAGNOSIS — C9200 Acute myeloblastic leukemia, not having achieved remission: Secondary | ICD-10-CM

## 2013-02-11 MED ORDER — ACETAMINOPHEN 325 MG PO TABS
325.0000 mg | ORAL_TABLET | Freq: Once | ORAL | Status: AC
  Administered 2013-02-11: 325 mg via ORAL

## 2013-02-11 MED ORDER — HEPARIN SOD (PORK) LOCK FLUSH 100 UNIT/ML IV SOLN
500.0000 [IU] | Freq: Every day | INTRAVENOUS | Status: AC | PRN
  Administered 2013-02-11: 500 [IU]
  Filled 2013-02-11: qty 5

## 2013-02-11 MED ORDER — SODIUM CHLORIDE 0.9 % IV SOLN
250.0000 mL | Freq: Once | INTRAVENOUS | Status: AC
  Administered 2013-02-11: 250 mL via INTRAVENOUS

## 2013-02-11 MED ORDER — DIPHENHYDRAMINE HCL 25 MG PO CAPS
25.0000 mg | ORAL_CAPSULE | Freq: Once | ORAL | Status: AC
  Administered 2013-02-11: 25 mg via ORAL

## 2013-02-11 MED ORDER — AZACITIDINE CHEMO SQ INJECTION
75.0000 mg/m2 | Freq: Once | INTRAMUSCULAR | Status: AC
  Administered 2013-02-11: 150 mg via SUBCUTANEOUS
  Filled 2013-02-11: qty 6

## 2013-02-11 MED ORDER — ONDANSETRON HCL 8 MG PO TABS
8.0000 mg | ORAL_TABLET | Freq: Once | ORAL | Status: AC
  Administered 2013-02-11: 8 mg via ORAL

## 2013-02-11 MED ORDER — SODIUM CHLORIDE 0.9 % IJ SOLN
10.0000 mL | INTRAMUSCULAR | Status: AC | PRN
  Administered 2013-02-11: 10 mL
  Filled 2013-02-11: qty 10

## 2013-02-11 NOTE — Patient Instructions (Signed)
[facility] Discharge Instructions for Patients Receiving Chemotherapy  Today you received the following chemotherapy agents: Vidaza  To help prevent nausea and vomiting after your treatment, we encourage you to take your nausea medication as directed by your MD.   If you develop nausea and vomiting that is not controlled by your nausea medication, call the clinic. If it is after clinic hours your family physician or the after hours number for the clinic or go to the Emergency Department.   BELOW ARE SYMPTOMS THAT SHOULD BE REPORTED IMMEDIATELY:  *FEVER GREATER THAN 100.5 F  *CHILLS WITH OR WITHOUT FEVER  NAUSEA AND VOMITING THAT IS NOT CONTROLLED WITH YOUR NAUSEA MEDICATION  *UNUSUAL SHORTNESS OF BREATH  *UNUSUAL BRUISING OR BLEEDING  TENDERNESS IN MOUTH AND THROAT WITH OR WITHOUT PRESENCE OF ULCERS  *URINARY PROBLEMS  *BOWEL PROBLEMS  UNUSUAL RASH Items with * indicate a potential emergency and should be followed up as soon as possible.   Feel free to call the clinic you have any questions or concerns. The clinic phone number is [phone].   Blood Transfusion Information WHAT IS A BLOOD TRANSFUSION? A transfusion is the replacement of blood or some of its parts. Blood is made up of multiple cells which provide different functions.  Red blood cells carry oxygen and are used for blood loss replacement.  White blood cells fight against infection.  Platelets control bleeding.  Plasma helps clot blood.  Other blood products are available for specialized needs, such as hemophilia or other clotting disorders. BEFORE THE TRANSFUSION  Who gives blood for transfusions?   You may be able to donate blood to be used at a later date on yourself (autologous donation).  Relatives can be asked to donate blood. This is generally not any safer than if you have received blood from a stranger. The same precautions are taken to ensure safety when a relative's blood  is donated.  Healthy volunteers who are fully evaluated to make sure their blood is safe. This is blood bank blood. Transfusion therapy is the safest it has ever been in the practice of medicine. Before blood is taken from a donor, a complete history is taken to make sure that person has no history of diseases nor engages in risky social behavior (examples are intravenous drug use or sexual activity with multiple partners). The donor's travel history is screened to minimize risk of transmitting infections, such as malaria. The donated blood is tested for signs of infectious diseases, such as HIV and hepatitis. The blood is then tested to be sure it is compatible with you in order to minimize the chance of a transfusion reaction. If you or a relative donates blood, this is often done in anticipation of surgery and is not appropriate for emergency situations. It takes many days to process the donated blood. RISKS AND COMPLICATIONS Although transfusion therapy is very safe and saves many lives, the main dangers of transfusion include:   Getting an infectious disease.  Developing a transfusion reaction. This is an allergic reaction to something in the blood you were given. Every precaution is taken to prevent this. The decision to have a blood transfusion has been considered carefully by your caregiver before blood is given. Blood is not given unless the benefits outweigh the risks. AFTER THE TRANSFUSION  Right after receiving a blood transfusion, you will usually feel much better and more energetic. This is especially true if your red blood cells have gotten low (anemic). The transfusion raises  the level of the red blood cells which carry oxygen, and this usually causes an energy increase.  The nurse administering the transfusion will monitor you carefully for complications. HOME CARE INSTRUCTIONS  No special instructions are needed after a transfusion. You may find your energy is better. Speak with  your caregiver about any limitations on activity for underlying diseases you may have. SEEK MEDICAL CARE IF:   Your condition is not improving after your transfusion.  You develop redness or irritation at the intravenous (IV) site. SEEK IMMEDIATE MEDICAL CARE IF:  Any of the following symptoms occur over the next 12 hours:  Shaking chills.  You have a temperature by mouth above 102 F (38.9 C), not controlled by medicine.  Chest, back, or muscle pain.  People around you feel you are not acting correctly or are confused.  Shortness of breath or difficulty breathing.  Dizziness and fainting.  You get a rash or develop hives.  You have a decrease in urine output.  Your urine turns a dark color or changes to pink, red, or Andy. Any of the following symptoms occur over the next 10 days:  You have a temperature by mouth above 102 F (38.9 C), not controlled by medicine.  Shortness of breath.  Weakness after normal activity.  The white part of the eye turns yellow (jaundice).  You have a decrease in the amount of urine or are urinating less often.  Your urine turns a dark color or changes to pink, red, or [place]. Document Released: 12/08/2000 Document Revised: 03/04/2012 Document Reviewed: 07/27/2008 [facility] Patient Information 2013 [place], [place].

## 2013-02-11 NOTE — Progress Notes (Signed)
Ok to proceed with treatment today with counts from 02/10/13, despite WBC, ANC, and PLT, verbal order received from Clenton Pare, NP and read back.

## 2013-02-12 ENCOUNTER — Ambulatory Visit (HOSPITAL_BASED_OUTPATIENT_CLINIC_OR_DEPARTMENT_OTHER): Payer: Medicare Other

## 2013-02-12 VITALS — BP 110/55 | HR 69 | Temp 97.9°F | Resp 20

## 2013-02-12 DIAGNOSIS — D46Z Other myelodysplastic syndromes: Secondary | ICD-10-CM

## 2013-02-12 DIAGNOSIS — Z5111 Encounter for antineoplastic chemotherapy: Secondary | ICD-10-CM

## 2013-02-12 DIAGNOSIS — C9200 Acute myeloblastic leukemia, not having achieved remission: Secondary | ICD-10-CM

## 2013-02-12 LAB — TYPE AND SCREEN: Unit division: 0

## 2013-02-12 MED ORDER — AZACITIDINE CHEMO SQ INJECTION
75.0000 mg/m2 | Freq: Once | INTRAMUSCULAR | Status: AC
  Administered 2013-02-12: 150 mg via SUBCUTANEOUS
  Filled 2013-02-12: qty 6

## 2013-02-12 MED ORDER — ONDANSETRON HCL 8 MG PO TABS
8.0000 mg | ORAL_TABLET | Freq: Once | ORAL | Status: AC
  Administered 2013-02-12: 8 mg via ORAL

## 2013-02-12 NOTE — Patient Instructions (Addendum)
[facility] Discharge Instructions for Patients Receiving Chemotherapy  Today you received the following chemotherapy agents vidaza  To help prevent nausea and vomiting after your treatment, we encourage you to take your nausea medication B and take it as often as prescribed   If you develop nausea and vomiting that is not controlled by your nausea medication, call the clinic. If it is after clinic hours your family physician or the after hours number for the clinic or go to the Emergency Department.   BELOW ARE SYMPTOMS THAT SHOULD BE REPORTED IMMEDIATELY:  *FEVER GREATER THAN 100.5 F  *CHILLS WITH OR WITHOUT FEVER  NAUSEA AND VOMITING THAT IS NOT CONTROLLED WITH YOUR NAUSEA MEDICATION  *UNUSUAL SHORTNESS OF BREATH  *UNUSUAL BRUISING OR BLEEDING  TENDERNESS IN MOUTH AND THROAT WITH OR WITHOUT PRESENCE OF ULCERS  *URINARY PROBLEMS  *BOWEL PROBLEMS  UNUSUAL RASH Items with * indicate a potential emergency and should be followed up as soon as possible.  One of the nurses will contact you 24 hours after your treatment. Please let the nurse know about any problems that you may have experienced. Feel free to call the clinic you have any questions or concerns. The clinic phone number is [phone].   I have been informed and understand all the instructions given to me. I know to contact the clinic, my physician, or go to the Emergency Department if any problems should occur. I do not have any questions at this time, but understand that I may call the clinic during office hours   should I have any questions or need assistance in obtaining follow up care.    __________________________________________  _____________  __________ Signature of Patient or Authorized Representative            Date                   Time    __________________________________________ Nurse's Signature

## 2013-02-13 ENCOUNTER — Telehealth: Payer: Self-pay | Admitting: Cardiovascular Disease

## 2013-02-13 MED ORDER — METOPROLOL SUCCINATE ER 100 MG PO TB24: 100.0000 mg | ORAL_TABLET | Freq: Every day | ORAL | Status: DC

## 2013-02-13 MED ORDER — AMLODIPINE BESYLATE 5 MG PO TABS: 5.0000 mg | ORAL_TABLET | Freq: Every day | ORAL | Status: DC

## 2013-02-13 NOTE — Telephone Encounter (Signed)
Pt needs amlodipine 5mg  qd, metotoporal 100 mg qd called [facility]

## 2013-02-13 NOTE — Telephone Encounter (Signed)
Spoke with pt. He is due for 1 year follow up with Dr. Clifton James. Last seen October 2012. Pt scheduled appt for March 27, 2013 at 9:45 with Dr. Clifton James.  Will send refills for amlodipine and Toprol [facility] for [facility] days.

## 2013-02-14 ENCOUNTER — Other Ambulatory Visit: Payer: Self-pay | Admitting: Radiology

## 2013-02-17 ENCOUNTER — Other Ambulatory Visit (HOSPITAL_BASED_OUTPATIENT_CLINIC_OR_DEPARTMENT_OTHER): Payer: Medicare Other | Admitting: Lab

## 2013-02-17 DIAGNOSIS — C9200 Acute myeloblastic leukemia, not having achieved remission: Secondary | ICD-10-CM

## 2013-02-17 DIAGNOSIS — D46Z Other myelodysplastic syndromes: Secondary | ICD-10-CM

## 2013-02-17 LAB — CBC WITH DIFFERENTIAL/PLATELET
BASO%: 0.8 % (ref 0.0–2.0)
EOS%: 3.1 % (ref 0.0–7.0)
LYMPH%: 75.6 % — ABNORMAL HIGH (ref 14.0–49.0)
MCHC: 31.8 g/dL — ABNORMAL LOW (ref 32.0–36.0)
MCV: 98.5 fL — ABNORMAL HIGH (ref 79.3–98.0)
MONO%: 1.5 % (ref 0.0–14.0)
Platelets: 56 10*3/uL — ABNORMAL LOW (ref 140–400)
RBC: 2.68 10*6/uL — ABNORMAL LOW (ref 4.20–5.82)
WBC: 1.3 10*3/uL — ABNORMAL LOW (ref 4.0–10.3)
nRBC: 2 % — ABNORMAL HIGH (ref 0–0)

## 2013-02-18 ENCOUNTER — Inpatient Hospital Stay (HOSPITAL_COMMUNITY): Admission: RE | Admit: 2013-02-18 | Payer: Medicare Other | Source: Ambulatory Visit

## 2013-02-18 ENCOUNTER — Ambulatory Visit (HOSPITAL_COMMUNITY): Payer: Medicare Other

## 2013-02-18 ENCOUNTER — Ambulatory Visit (HOSPITAL_COMMUNITY): Admission: RE | Admit: 2013-02-18 | Payer: Medicare Other | Source: Ambulatory Visit

## 2013-02-19 ENCOUNTER — Encounter (HOSPITAL_COMMUNITY): Payer: Self-pay | Admitting: Pharmacy Technician

## 2013-02-20 ENCOUNTER — Ambulatory Visit (HOSPITAL_COMMUNITY)
Admission: RE | Admit: 2013-02-20 | Discharge: 2013-02-20 | Disposition: A | Discharge status: Home / Self Care | Payer: Medicare Other | Source: Ambulatory Visit | Attending: Oncology | Admitting: Oncology

## 2013-02-20 ENCOUNTER — Encounter (HOSPITAL_COMMUNITY): Payer: Self-pay

## 2013-02-20 DIAGNOSIS — I129 Hypertensive chronic kidney disease with stage 1 through stage 4 chronic kidney disease, or unspecified chronic kidney disease: Secondary | ICD-10-CM | POA: Insufficient documentation

## 2013-02-20 DIAGNOSIS — M810 Age-related osteoporosis without current pathological fracture: Secondary | ICD-10-CM | POA: Insufficient documentation

## 2013-02-20 DIAGNOSIS — Z79899 Other long term (current) drug therapy: Secondary | ICD-10-CM | POA: Insufficient documentation

## 2013-02-20 DIAGNOSIS — C9200 Acute myeloblastic leukemia, not having achieved remission: Secondary | ICD-10-CM | POA: Insufficient documentation

## 2013-02-20 DIAGNOSIS — I2510 Atherosclerotic heart disease of native coronary artery without angina pectoris: Secondary | ICD-10-CM | POA: Insufficient documentation

## 2013-02-20 DIAGNOSIS — N400 Enlarged prostate without lower urinary tract symptoms: Secondary | ICD-10-CM | POA: Insufficient documentation

## 2013-02-20 DIAGNOSIS — Z951 Presence of aortocoronary bypass graft: Secondary | ICD-10-CM | POA: Insufficient documentation

## 2013-02-20 DIAGNOSIS — E039 Hypothyroidism, unspecified: Secondary | ICD-10-CM | POA: Insufficient documentation

## 2013-02-20 DIAGNOSIS — E785 Hyperlipidemia, unspecified: Secondary | ICD-10-CM | POA: Insufficient documentation

## 2013-02-20 DIAGNOSIS — N181 Chronic kidney disease, stage 1: Secondary | ICD-10-CM | POA: Insufficient documentation

## 2013-02-20 LAB — CBC
HCT: 25 % — ABNORMAL LOW (ref 39.0–52.0)
Hemoglobin: 8.1 g/dL — ABNORMAL LOW (ref 13.0–17.0)
MCH: 32.1 pg (ref 26.0–34.0)
MCHC: 32.4 g/dL (ref 30.0–36.0)
RBC: 2.52 MIL/uL — ABNORMAL LOW (ref 4.22–5.81)
WBC: 1.1 10*3/uL — CL (ref 4.0–10.5)

## 2013-02-20 LAB — BONE MARROW EXAM: Bone Marrow Exam: 149

## 2013-02-20 LAB — PROTIME-INR: INR: 1.07 (ref 0.00–1.49)

## 2013-02-20 LAB — APTT: aPTT: 35 seconds (ref 24–37)

## 2013-02-20 MED ORDER — FENTANYL CITRATE 0.05 MG/ML IJ SOLN
INTRAMUSCULAR | Status: AC | PRN
  Administered 2013-02-20 (×3): 50 ug via INTRAVENOUS

## 2013-02-20 MED ORDER — MIDAZOLAM HCL 2 MG/2ML IJ SOLN
INTRAMUSCULAR | Status: AC
  Filled 2013-02-20: qty 6

## 2013-02-20 MED ORDER — FENTANYL CITRATE 0.05 MG/ML IJ SOLN
INTRAMUSCULAR | Status: AC
  Filled 2013-02-20: qty 6

## 2013-02-20 MED ORDER — MIDAZOLAM HCL 2 MG/2ML IJ SOLN
INTRAMUSCULAR | Status: AC | PRN
  Administered 2013-02-20 (×3): 1 mg via INTRAVENOUS

## 2013-02-20 MED ORDER — SODIUM CHLORIDE 0.9 % IV SOLN: INTRAVENOUS | Status: DC

## 2013-02-20 NOTE — H&P (Signed)
Kyle Brewer is an 79 y.o. male.   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  Past Medical History  Diagnosis Date  . Inguinal hernia     right  . Osteoporosis   . Hypothyroid   . Hyperlipidemia   . Arthritis   . BPH (benign prostatic hyperplasia)   . Wound, open, elbow     Right, followed [facility]  . Hypertension   . CAD (coronary artery disease)     3V CABG 1995, relook cath 2001 with patent grafts. normal stress test 2010.   . Back pain   . CKD (chronic kidney disease), stage I   . Bursitis   . Pancytopenia   . Kidney stone   . 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 MDS (myelodysplastic syndrome), high grade     Past Surgical History  Procedure Laterality Date  . Appendectomy  1968  . Coronary artery bypass graft  1995  . Total knee arthroplasty  2007    right  . Elbow arthroscopy  2012    right  . Transurethral resection of prostate      Dr. Isabel Caprice  . Hernia repair  11/07/11    lap bilateral inguinal    Family History  Problem Relation Age of Onset  . Cancer Father     lung  . Heart failure Mother   . Cancer Brother     lung    Social History:  reports that he quit smoking about 49 years ago. He has never used smokeless tobacco. He reports that he does not drink alcohol or use illicit drugs.  Allergies: No Known Allergies  Current outpatient prescriptions:amLODipine (NORVASC) 5 MG tablet, Take 5 mg by mouth daily before breakfast., Disp: , Rfl: ;  Ascorbic Acid (VITAMIN C PO), Take 500 mg by mouth daily. , Disp: , Rfl: ;  Calcium Carbonate-Vitamin D (CALCIUM 600 + D PO), Take 1 tablet by mouth 2 (two) times daily., Disp: , Rfl: ;  Cyanocobalamin (VITAMIN B12 PO), Take 1,000 mcg by mouth daily. , Disp: , Rfl:  dutasteride (AVODART) 0.5 MG capsule, Take 0.5 mg by mouth daily.  , Disp: , Rfl: ;  levothyroxine  (SYNTHROID, LEVOTHROID) 50 MCG tablet, Take 50 mcg by mouth every evening. , Disp: , Rfl: ;  lidocaine-prilocaine (EMLA) cream, Apply topically as needed., Disp: 30 g, Rfl: 2;  lisinopril (PRINIVIL,ZESTRIL) 10 MG tablet, Take 10 mg by mouth every evening. , Disp: , Rfl:  meloxicam (MOBIC) 7.5 MG tablet, Take 7.5 mg by mouth 2 (two) times daily. , Disp: , Rfl: ;  metoprolol succinate (TOPROL-XL) 100 MG 24 hr tablet, Take 100 mg by mouth daily before breakfast. Take with or immediately following a meal., Disp: , Rfl: ;  Multiple Vitamin (MULTIVITAMIN WITH MINERALS) TABS, Take 1 tablet by mouth daily., Disp: , Rfl: ;  simvastatin (ZOCOR) 20 MG tablet, Take 20 mg by mouth at bedtime.  , Disp: , Rfl:  terazosin (HYTRIN) 10 MG capsule, Take 10 mg by mouth at bedtime.  , Disp: , Rfl: ;  alendronate (FOSAMAX) 70 MG tablet, Take 70 mg by mouth every 7 (seven) days. Take with a full glass of water on an empty stomach. , Disp: , Rfl: ;  aspirin 81 MG tablet, Take 81 mg by mouth daily.  , Disp: , Rfl: ;  LORazepam (ATIVAN) 0.5  MG tablet, Take 0.5 mg by mouth at bedtime as needed for anxiety (sleep). , Disp: , Rfl:  Current facility-administered medications:0.9 %  sodium chloride infusion, , Intravenous, Continuous, Brayton El, PA  Results for orders placed in visit on 02/17/13  CBC WITH DIFFERENTIAL      Result Value Range   WBC 1.3 (*) 4.0 - 10.3 10e3/uL   NEUT# 0.3 (*) 1.5 - 6.5 10e3/uL   HGB 8.4 (*) 13.0 - 17.1 g/dL   HCT 47.8 (*) 29.5 - 62.1 %   Platelets 56 (*) 140 - 400 10e3/uL   MCV 98.5 (*) 79.3 - 98.0 fL   MCH 31.3  27.2 - 33.4 pg   MCHC 31.8 (*) 32.0 - 36.0 g/dL   RBC 3.08 (*) 6.57 - 8.46 10e6/uL   RDW 16.6 (*) 11.0 - 14.6 %   lymph# 1.0  0.9 - 3.3 10e3/uL   MONO# 0.0 (*) 0.1 - 0.9 10e3/uL   Eosinophils Absolute 0.0  0.0 - 0.5 10e3/uL   Basophils Absolute 0.0  0.0 - 0.1 10e3/uL   NEUT% 19.0 (*) 39.0 - 75.0 %   LYMPH% 75.6 (*) 14.0 - 49.0 %   MONO% 1.5  0.0 - 14.0 %   EOS% 3.1  0.0 - 7.0 %    BASO% 0.8  0.0 - 2.0 %   nRBC 2 (*) 0 - 0 %   Results for orders placed during the hospital encounter of 02/20/13  APTT      Result Value Range   aPTT 35  24 - 37 seconds  PROTIME-INR      Result Value Range   Prothrombin Time 13.8  11.6 - 15.2 seconds   INR 1.07  0.00 - 1.49     Review of Systems  Constitutional: Positive for weight loss and malaise/fatigue. Negative for fever and chills.       Night sweats  Respiratory: Positive for shortness of breath. Negative for cough.   Cardiovascular: Negative for chest pain.  Gastrointestinal: Negative for nausea, vomiting and abdominal pain.  Musculoskeletal: Positive for back pain.  Neurological: Positive for weakness. Negative for headaches.  Endo/Heme/Allergies: Does not bruise/bleed easily.   Vitals: BP 113/61  HR 85  R 20   TEMP 97  O2 SATS 97%RA Height 5\' 10"  (1.778 m), weight 189 lb (85.73 kg). Physical Exam  Constitutional: He is oriented to person, place, and time. He appears well-developed and well-nourished.  Cardiovascular: Normal rate and regular rhythm.   Respiratory: Effort normal and breath sounds normal.  Intact,NT rt chest PAC  GI: Soft. Bowel sounds are normal.  Hiatal hernia  Musculoskeletal: Normal range of motion. He exhibits edema.  Neurological: He is alert and oriented to person, place, and time.     Assessment/Plan Pt with hx of transformed MDS to AML. Plan is for CT guided bone marrow biopsy today to assess response to treatment. Details/risks of procedure d/w pt with his understanding and consent.  [name],[name] 02/20/2013, 9:24 AM

## 2013-02-20 NOTE — Procedures (Signed)
Technically successful CT guided bone marrow aspiration and biopsy of left iliac crest. No immediate complications. Awaiting pathology report.    

## 2013-02-24 ENCOUNTER — Other Ambulatory Visit (HOSPITAL_BASED_OUTPATIENT_CLINIC_OR_DEPARTMENT_OTHER): Payer: Medicare Other

## 2013-02-24 DIAGNOSIS — C9200 Acute myeloblastic leukemia, not having achieved remission: Secondary | ICD-10-CM

## 2013-02-24 DIAGNOSIS — D46Z Other myelodysplastic syndromes: Secondary | ICD-10-CM

## 2013-02-24 LAB — CBC WITH DIFFERENTIAL/PLATELET
BASO%: 0 % (ref 0.0–2.0)
Eosinophils Absolute: 0 10*3/uL (ref 0.0–0.5)
MCHC: 32 g/dL (ref 32.0–36.0)
MONO#: 0 10*3/uL — ABNORMAL LOW (ref 0.1–0.9)
NEUT#: 0.2 10*3/uL — CL (ref 1.5–6.5)
RBC: 2.51 10*6/uL — ABNORMAL LOW (ref 4.20–5.82)
RDW: 16.6 % — ABNORMAL HIGH (ref 11.0–14.6)
WBC: 1.3 10*3/uL — ABNORMAL LOW (ref 4.0–10.3)
lymph#: 1.1 10*3/uL (ref 0.9–3.3)
nRBC: 1 % — ABNORMAL HIGH (ref 0–0)

## 2013-02-27 LAB — CHROMOSOME ANALYSIS, BONE MARROW

## 2013-03-03 ENCOUNTER — Other Ambulatory Visit: Payer: Medicare Other | Admitting: Lab

## 2013-03-03 ENCOUNTER — Ambulatory Visit (HOSPITAL_BASED_OUTPATIENT_CLINIC_OR_DEPARTMENT_OTHER): Payer: Medicare Other | Admitting: Oncology

## 2013-03-03 ENCOUNTER — Ambulatory Visit: Payer: Medicare Other

## 2013-03-03 ENCOUNTER — Other Ambulatory Visit (HOSPITAL_BASED_OUTPATIENT_CLINIC_OR_DEPARTMENT_OTHER): Payer: Medicare Other | Admitting: Lab

## 2013-03-03 ENCOUNTER — Telehealth: Payer: Self-pay | Admitting: Oncology

## 2013-03-03 VITALS — BP 119/61 | HR 79 | Temp 97.3°F | Resp 18 | Ht 70.0 in | Wt 189.6 lb

## 2013-03-03 DIAGNOSIS — M810 Age-related osteoporosis without current pathological fracture: Secondary | ICD-10-CM

## 2013-03-03 DIAGNOSIS — C9200 Acute myeloblastic leukemia, not having achieved remission: Secondary | ICD-10-CM

## 2013-03-03 DIAGNOSIS — D46Z Other myelodysplastic syndromes: Secondary | ICD-10-CM

## 2013-03-03 DIAGNOSIS — D469 Myelodysplastic syndrome, unspecified: Secondary | ICD-10-CM

## 2013-03-03 DIAGNOSIS — D61818 Other pancytopenia: Secondary | ICD-10-CM

## 2013-03-03 LAB — CBC WITH DIFFERENTIAL/PLATELET
BASO%: 0 % (ref 0.0–2.0)
Basophils Absolute: 0 10*3/uL (ref 0.0–0.1)
EOS%: 1 % (ref 0.0–7.0)
HCT: 24.5 % — ABNORMAL LOW (ref 38.4–49.9)
HGB: 7.7 g/dL — ABNORMAL LOW (ref 13.0–17.1)
MCHC: 31.4 g/dL — ABNORMAL LOW (ref 32.0–36.0)
MONO#: 0 10*3/uL — ABNORMAL LOW (ref 0.1–0.9)
NEUT%: 16.7 % — ABNORMAL LOW (ref 39.0–75.0)
RDW: 17.1 % — ABNORMAL HIGH (ref 11.0–14.6)
WBC: 1 10*3/uL — ABNORMAL LOW (ref 4.0–10.3)
lymph#: 0.8 10*3/uL — ABNORMAL LOW (ref 0.9–3.3)

## 2013-03-03 NOTE — Progress Notes (Signed)
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   PAST THERAPY: Biopsy only   CURRENT THERAPY:  started on 11/11/2012 Vidaza (hypomethylating chemo) d1-5, d8-9; of every 4 weeks cycle.   INTERVAL HISTORY: Kyle Brewer 79 y.o. male returns for regular follow up with his son.  He reported mild to moderate fatigue when he does chores around the house such has cooking, cleaning, doing laundry.  However, he is still able to live by himself.   Patient denies fever, anorexia, weight loss, headache, visual changes, confusion, drenching night sweats, palpable lymph node swelling, mucositis, odynophagia, dysphagia, nausea vomiting, jaundice, chest pain, palpitation, shortness of breath, dyspnea on exertion, productive cough, gum bleeding, epistaxis, hematemesis, hemoptysis, abdominal pain, abdominal swelling, early satiety, melena, hematochezia, hematuria, skin rash, spontaneous bleeding, joint swelling, joint pain, heat or cold intolerance, bowel bladder incontinence, back pain, focal motor weakness, paresthesia, depression.      Past Medical History  Diagnosis Date  . Inguinal hernia     right  . Osteoporosis   . Hypothyroid   . Hyperlipidemia   . Arthritis   . BPH (benign prostatic hyperplasia)   . Wound, open, elbow     Right, followed [facility]  . Hypertension   . CAD (coronary artery disease)     3V CABG 1995, relook cath 2001 with patent grafts. normal stress test 2010.   . Back pain   . CKD (chronic kidney disease), stage I   . Bursitis   . Pancytopenia   . Kidney stone   . 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 MDS (myelodysplastic syndrome), high grade     Past Surgical History  Procedure Laterality Date  . Appendectomy  1968  . Coronary artery bypass graft  1995  . Total knee arthroplasty   2007    right  . Elbow arthroscopy  2012    right  . Transurethral resection of prostate      Dr. Isabel Caprice  . Hernia repair  11/07/11    lap bilateral inguinal    Current Outpatient Prescriptions  Medication Sig Dispense Refill  . alendronate (FOSAMAX) 70 MG tablet Take 70 mg by mouth every 7 (seven) days. Take with a full glass of water on an empty stomach.       Marland Kitchen amLODipine (NORVASC) 5 MG tablet Take 5 mg by mouth daily before breakfast.      . Ascorbic Acid (VITAMIN C PO) Take 500 mg by mouth daily.       Marland Kitchen aspirin 81 MG tablet Take 81 mg by mouth daily.        . Calcium Carbonate-Vitamin D (CALCIUM 600 + D PO) Take 1 tablet by mouth 2 (two) times daily.      . Cyanocobalamin (VITAMIN B12 PO) Take 1,000 mcg by mouth daily.       Marland Kitchen dutasteride (AVODART) 0.5 MG capsule Take 0.5 mg by mouth daily.        Marland Kitchen levothyroxine (SYNTHROID, LEVOTHROID) 50 MCG tablet Take 50 mcg by mouth every evening.       . lidocaine-prilocaine (EMLA) cream Apply topically as needed.  30 g  2  . lisinopril (PRINIVIL,ZESTRIL) 10 MG tablet Take 10 mg by mouth every evening.       Marland Kitchen LORazepam (ATIVAN) 0.5 MG tablet Take 0.5 mg by mouth at bedtime  as needed for anxiety (sleep).       . meloxicam (MOBIC) 7.5 MG tablet Take 7.5 mg by mouth 2 (two) times daily.       . metoprolol succinate (TOPROL-XL) 100 MG 24 hr tablet Take 100 mg by mouth daily before breakfast. Take with or immediately following a meal.      . Multiple Vitamin (MULTIVITAMIN WITH MINERALS) TABS Take 1 tablet by mouth daily.      . simvastatin (ZOCOR) 20 MG tablet Take 20 mg by mouth at bedtime.        Marland Kitchen terazosin (HYTRIN) 10 MG capsule Take 10 mg by mouth at bedtime.         No current facility-administered medications for this visit.    ALLERGIES:  has No Known Allergies.  REVIEW OF SYSTEMS:  The rest of the 14-point review of system was negative.   Filed Vitals:   03/03/13 0953  BP: 119/61  Pulse: 79  Temp: 97.3 F (36.3 C)  Resp: 18    Wt Readings from Last 3 Encounters:  03/03/13 189 lb 9.6 oz (86.002 kg)  02/20/13 189 lb (85.73 kg)  02/03/13 189 lb 6.4 oz (85.911 kg)   ECOG Performance status: 1-2  PHYSICAL EXAMINATION:   General:  well-nourished man, in no acute distress.  Eyes:  no scleral icterus.  ENT:  There were no oropharyngeal lesions.  Neck was without thyromegaly.  Lymphatics:  Negative cervical, supraclavicular or axillary adenopathy.  Respiratory: lungs were clear bilaterally without wheezing or crackles.  Cardiovascular:  Regular rate and rhythm, S1/S2, without rub or gallop. There was a II/VI systolic murmur best heard in the left upper sternal border. There was no pedal edema.  GI:  abdomen was soft, flat, nontender, nondistended, without organomegaly.  Muscoloskeletal:  no spinal tenderness of palpation of vertebral spine.  Skin exam was without petichae. There was some ecchymosis on the skin over the abdomen where he had Vidaza injection last week.  There was pain, erythema to palpation.  Neuro exam was nonfocal.  Patient was able to get on and off exam table without assistance.  Gait was normal.  Patient was alerted and oriented.  Attention was good.   Language was appropriate.  Mood was normal without depression.  Speech was not pressured.  Thought content was not tangential.     LABORATORY/RADIOLOGY DATA:  Lab Results  Component Value Date   WBC 1.0* 03/03/2013   HGB 7.7* 03/03/2013   HCT 24.5* 03/03/2013   PLT 96* 03/03/2013   GLUCOSE 103* 02/03/2013   ALKPHOS 68 02/03/2013   ALT 15 02/03/2013   AST 11 02/03/2013   NA 142 02/03/2013   K 4.4 02/03/2013   CL 110* 02/03/2013   CREATININE 1.8* 02/03/2013   BUN 36.9* 02/03/2013   CO2 24 02/03/2013   INR 1.07 02/20/2013     ASSESSMENT AND PLAN:   1. Hypertension: He is on amlodipine, lisinopril, metoprolol.  2. Hyperlipidemia: He is on simvastatin.  3. CAD: He is on ASA, simvastatin, lisinopril, and metoprolol.  4. Hypothyroidism: He is on Synthroid.    5. BPH: He is on dutasteride and terazosin.  6. Osteoporosis: He is on Fosamax.  7. Transformed myelodysplastic syndrome (MDS) to acute myeloid leukemia (AML)  - Treatment: Vidaza injection d1-5 and 8-9 every 4 weeks started in Nov 2013. - Status:  Repeat bone marrow biopsy in late 01/2013 showed again about 20% blast.  He has not require frequent blood transfusion.  However, his cytopenia  has been worse after 3 months of chemo compared to before.  - Reccommedations:  *  Stop Vidaza and follow blood count and transfuse as needed.   *  2nd opinion at [place], [place], or [place].  He would like to be referred to [facility].   *  There are two more agents for MDS:  Decitabine which has similar mechanism of action like Vidaza.  However, this is given in the hospital.  The chance of this working with no response with Vidaza is very low.  There is a chemo pill Revlimid.  The chance of this working is about 20% in your case.  However, there is a high possibility of significantly worsening of your anemia requiring blood transfusion.  I do not personally think that these two options are appropriate since his performance status will worsen and he will require frequent blood transfusion.   8.  Pancytopenia:  Due to MDS/AML.  There is no active bleeding.  There is no indication for transfusion today.  Goal of transfusion Hgb <7 or 8; Plt <10K or active bleeding.   9 . Code status: FULL CODE.  10.  Follow up:  Weekly CBC to see if he needs transfusion.  I'll see him in about 3 weeks for follow up with [facility] recommendation.     The length of time of the face-to-face encounter was 25 minutes. More than 50% of time was spent counseling and coordination of care.

## 2013-03-03 NOTE — Patient Instructions (Addendum)
1.  Diagnosis:  Transformed myelodysplastic syndrome (MDS) to acute myeloid leukemia. 2.  Status:  No response with Vidaza chemo. 3.  Reccommedations:  *  Stop Vidaza and follow blood count and transfuse as needed.   *  2nd opinion at [place], [place], or [place].  *  There are two more agents for MDS:  Decitabine which has similar mechanism of action like Vidaza.  However, this is given in the hospital.  The chance of this working with no response with Vidaza is very low.  There is a chemo pill Revlimid.  The chance of this working is about 20% in your case.  However, there is a high possibility of significantly worsening of your anemia requiring blood transfusion.

## 2013-03-03 NOTE — Telephone Encounter (Signed)
Pt appt. With Dr. Lowell Guitar @ [place] is 03/07/13@1 :10. Medical records faxed. Pt is aware.

## 2013-03-04 ENCOUNTER — Ambulatory Visit: Payer: Medicare Other

## 2013-03-05 ENCOUNTER — Ambulatory Visit: Payer: Medicare Other

## 2013-03-06 ENCOUNTER — Ambulatory Visit: Payer: Medicare Other

## 2013-03-07 ENCOUNTER — Ambulatory Visit: Payer: Medicare Other

## 2013-03-10 ENCOUNTER — Other Ambulatory Visit (HOSPITAL_BASED_OUTPATIENT_CLINIC_OR_DEPARTMENT_OTHER): Payer: Medicare Other | Admitting: Lab

## 2013-03-10 ENCOUNTER — Ambulatory Visit: Payer: Medicare Other

## 2013-03-10 DIAGNOSIS — D469 Myelodysplastic syndrome, unspecified: Secondary | ICD-10-CM

## 2013-03-10 DIAGNOSIS — D46Z Other myelodysplastic syndromes: Secondary | ICD-10-CM

## 2013-03-10 LAB — CBC WITH DIFFERENTIAL/PLATELET
Basophils Absolute: 0 10*3/uL (ref 0.0–0.1)
Eosinophils Absolute: 0 10*3/uL (ref 0.0–0.5)
HGB: 8.1 g/dL — ABNORMAL LOW (ref 13.0–17.1)
MCV: 101.6 fL — ABNORMAL HIGH (ref 79.3–98.0)
MONO#: 0 10*3/uL — ABNORMAL LOW (ref 0.1–0.9)
MONO%: 1.3 % (ref 0.0–14.0)
NEUT#: 0.1 10*3/uL — CL (ref 1.5–6.5)
RBC: 2.56 10*6/uL — ABNORMAL LOW (ref 4.20–5.82)
RDW: 17.2 % — ABNORMAL HIGH (ref 11.0–14.6)
WBC: 1.5 10*3/uL — ABNORMAL LOW (ref 4.0–10.3)
nRBC: 0 % (ref 0–0)

## 2013-03-11 ENCOUNTER — Ambulatory Visit: Payer: Medicare Other

## 2013-03-17 ENCOUNTER — Telehealth: Payer: Self-pay | Admitting: Oncology

## 2013-03-17 ENCOUNTER — Other Ambulatory Visit (HOSPITAL_BASED_OUTPATIENT_CLINIC_OR_DEPARTMENT_OTHER): Payer: Medicare Other | Admitting: Lab

## 2013-03-17 ENCOUNTER — Telehealth: Payer: Self-pay | Admitting: *Deleted

## 2013-03-17 ENCOUNTER — Ambulatory Visit (HOSPITAL_BASED_OUTPATIENT_CLINIC_OR_DEPARTMENT_OTHER): Payer: Medicare Other | Admitting: Oncology

## 2013-03-17 DIAGNOSIS — D61818 Other pancytopenia: Secondary | ICD-10-CM

## 2013-03-17 DIAGNOSIS — M810 Age-related osteoporosis without current pathological fracture: Secondary | ICD-10-CM

## 2013-03-17 DIAGNOSIS — D469 Myelodysplastic syndrome, unspecified: Secondary | ICD-10-CM

## 2013-03-17 DIAGNOSIS — C9200 Acute myeloblastic leukemia, not having achieved remission: Secondary | ICD-10-CM

## 2013-03-17 LAB — CBC WITH DIFFERENTIAL/PLATELET
BASO%: 0 % (ref 0.0–2.0)
EOS%: 2.7 % (ref 0.0–7.0)
HCT: 26.5 % — ABNORMAL LOW (ref 38.4–49.9)
LYMPH%: 91.2 % — ABNORMAL HIGH (ref 14.0–49.0)
MCH: 31.5 pg (ref 27.2–33.4)
MCHC: 30.6 g/dL — ABNORMAL LOW (ref 32.0–36.0)
MCV: 103.1 fL — ABNORMAL HIGH (ref 79.3–98.0)
MONO%: 0.7 % (ref 0.0–14.0)
NEUT%: 5.4 % — ABNORMAL LOW (ref 39.0–75.0)
lymph#: 1.4 10*3/uL (ref 0.9–3.3)

## 2013-03-17 NOTE — Progress Notes (Signed)
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   PAST THERAPY: Biopsy only   CURRENT THERAPY:  started on 11/11/2012 Vidaza (hypomethylating chemo) d1-5, d8-9; of every 4 weeks cycle.   INTERVAL HISTORY: Kyle Brewer 79 y.o. male returns for regular follow up by himself.  He saw Dr. Lowell Brewer at [facility] who recommended resuming Vidaza at higher dose at 100 mg per meter square.  Kyle Brewer has been on observation. He feels much better than when he was on chemotherapy. He lives by himself and is able to take care his chores. He is independent of all activities of daily living. He has mild dyspnea on exertion climbing up stairs. He has mild bilateral pedal edema which resolves with leg elevation of light. He denies chest pain, PND, orthopnea.  Patient denies fever, anorexia, weight loss, fatigue, headache, visual changes, confusion, drenching night sweats, palpable lymph node swelling, mucositis, odynophagia, dysphagia, nausea vomiting, jaundice, chest pain, palpitation, productive cough, gum bleeding, epistaxis, hematemesis, hemoptysis, abdominal pain, abdominal swelling, early satiety, melena, hematochezia, hematuria, skin rash, spontaneous bleeding, joint swelling, joint pain, heat or cold intolerance, bowel bladder incontinence, back pain, focal motor weakness, paresthesia.      Past Medical History  Diagnosis Date  . Inguinal hernia     right  . Osteoporosis   . Hypothyroid   . Hyperlipidemia   . Arthritis   . BPH (benign prostatic hyperplasia)   . Wound, open, elbow     Right, followed [facility]  . Hypertension   . CAD (coronary artery disease)     3V CABG 1995, relook cath 2001 with patent grafts. normal stress test 2010.   . Back pain   . CKD (chronic kidney disease), stage I   . Bursitis   .  Pancytopenia   . Kidney stone   . 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 MDS (myelodysplastic syndrome), high grade     Past Surgical History  Procedure Laterality Date  . Appendectomy  1968  . Coronary artery bypass graft  1995  . Total knee arthroplasty  2007    right  . Elbow arthroscopy  2012    right  . Transurethral resection of prostate      Dr. Isabel Caprice  . Hernia repair  11/07/11    lap bilateral inguinal    Current Outpatient Prescriptions  Medication Sig Dispense Refill  . alendronate (FOSAMAX) 70 MG tablet Take 70 mg by mouth every 7 (seven) days. Take with a full glass of water on an empty stomach.       Marland Kitchen amLODipine (NORVASC) 5 MG tablet Take 5 mg by mouth daily before breakfast.      . Ascorbic Acid (VITAMIN C PO) Take 500 mg by mouth daily.       Marland Kitchen aspirin 81 MG tablet Take 81 mg by mouth daily.        . Calcium Carbonate-Vitamin D (CALCIUM 600 + D PO) Take 1 tablet by mouth 2 (two) times daily.      . Cyanocobalamin (VITAMIN B12 PO) Take 1,000 mcg by mouth daily.       Marland Kitchen dutasteride (AVODART) 0.5 MG capsule Take 0.5 mg by mouth daily.        Marland Kitchen levothyroxine (SYNTHROID, LEVOTHROID) 50 MCG tablet Take 50 mcg by mouth every evening.       Marland Kitchen  lidocaine-prilocaine (EMLA) cream Apply topically as needed.  30 g  2  . lisinopril (PRINIVIL,ZESTRIL) 10 MG tablet Take 10 mg by mouth every evening.       Marland Kitchen LORazepam (ATIVAN) 0.5 MG tablet Take 0.5 mg by mouth at bedtime as needed for anxiety (sleep).       . meloxicam (MOBIC) 7.5 MG tablet Take 7.5 mg by mouth 2 (two) times daily.       . metoprolol succinate (TOPROL-XL) 100 MG 24 hr tablet Take 100 mg by mouth daily before breakfast. Take with or immediately following a meal.      . Multiple Vitamin (MULTIVITAMIN WITH MINERALS) TABS Take 1 tablet by mouth daily.      . simvastatin (ZOCOR) 20 MG tablet Take 20 mg by mouth at bedtime.        Marland Kitchen terazosin (HYTRIN) 10 MG capsule Take 10 mg by mouth at bedtime.         No  current facility-administered medications for this visit.    ALLERGIES:  has No Known Allergies.  REVIEW OF SYSTEMS:  The rest of the 14-point review of system was negative.   There were no vitals filed for this visit. Wt Readings from Last 3 Encounters:  03/03/13 189 lb 9.6 oz (86.002 kg)  02/20/13 189 lb (85.73 kg)  02/03/13 189 lb 6.4 oz (85.911 kg)   ECOG Performance status: 1  PHYSICAL EXAMINATION:   General:  well-nourished man, in no acute distress.  Eyes:  no scleral icterus.  ENT:  There were no oropharyngeal lesions.  Neck was without thyromegaly.  Lymphatics:  Negative cervical, supraclavicular or axillary adenopathy.  Respiratory: lungs were clear bilaterally without wheezing or crackles.  Cardiovascular:  Regular rate and rhythm, S1/S2, without rub or gallop. There was a II/VI systolic murmur best heard in the left upper sternal border. There was 1+ bilateral pedal edema.  GI:  abdomen was soft, flat, nontender, nondistended, without organomegaly.  Muscoloskeletal:  no spinal tenderness of palpation of vertebral spine.  Skin exam was without petichae. There was some ecchymosis on the skin over the abdomen where he had Vidaza injection last week.  There was pain, erythema to palpation.  Neuro exam was nonfocal.  Patient was able to get on and off exam table without assistance.  Gait was normal.  Patient was alerted and oriented.  Attention was good.   Language was appropriate.  Mood was normal without depression.  Speech was not pressured.  Thought content was not tangential.     LABORATORY/RADIOLOGY DATA:  Lab Results  Component Value Date   WBC 1.5* 03/17/2013   HGB 8.1* 03/17/2013   HCT 26.5* 03/17/2013   PLT 92* 03/17/2013   GLUCOSE 103* 02/03/2013   ALKPHOS 68 02/03/2013   ALT 15 02/03/2013   AST 11 02/03/2013   NA 142 02/03/2013   K 4.4 02/03/2013   CL 110* 02/03/2013   CREATININE 1.8* 02/03/2013   BUN 36.9* 02/03/2013   CO2 24 02/03/2013   INR 1.07 02/20/2013      ASSESSMENT AND PLAN:   1. Hypertension: He is on amlodipine, lisinopril, metoprolol.  2. Hyperlipidemia: He is on simvastatin.  3. CAD: He is on ASA, simvastatin, lisinopril, and metoprolol.  4. Hypothyroidism: He is on Synthroid.  5. BPH: He is on dutasteride and terazosin.  6. Osteoporosis: He is on Fosamax.  7. Transformed myelodysplastic syndrome (MDS) to acute myeloid leukemia (AML)  - Treatment: Per recommendation of Dr. Lowell Brewer, patient has a choice  of either resuming chemotherapy Vidaza at higher dose or switching to decitabine IV which is an inpatient medication.  Despite grade 2 fatigue from chemotherapy, Mr. Elpers may consider resuming Vidaza outpatient. He requested one week to think it over and discuss with his son. I will see him with his son in 1 week when he will decide what he wants to do  8.  Pancytopenia:  Due to MDS/AML.  There is no active bleeding.  There is no indication for transfusion today.  Goal of transfusion Hgb <7 or 8; Plt <10K or active bleeding.   9 . Code status: FULL CODE.   The length of time of the face-to-face encounter was 15 minutes. More than 50% of time was spent counseling and coordination of care.

## 2013-03-17 NOTE — Telephone Encounter (Signed)
Son Kyle Brewer calling to say patient went to [place] for 2nd opinion and was seen by dr ha today and he was calling to follow up. He would like for dr ha to call him. [phone]

## 2013-03-17 NOTE — Telephone Encounter (Signed)
per [place]ie based on pt labs Dr. Gaylyn Rong needed to see pt.

## 2013-03-18 ENCOUNTER — Telehealth: Payer: Self-pay | Admitting: *Deleted

## 2013-03-18 ENCOUNTER — Other Ambulatory Visit (HOSPITAL_COMMUNITY): Payer: Medicare Other

## 2013-03-18 NOTE — Telephone Encounter (Signed)
Per staff message and POF I have scheduled appts.  JMW  

## 2013-03-21 ENCOUNTER — Other Ambulatory Visit: Payer: Self-pay | Admitting: Oncology

## 2013-03-21 DIAGNOSIS — C9200 Acute myeloblastic leukemia, not having achieved remission: Secondary | ICD-10-CM

## 2013-03-21 DIAGNOSIS — D46Z Other myelodysplastic syndromes: Secondary | ICD-10-CM

## 2013-03-24 ENCOUNTER — Telehealth: Payer: Self-pay | Admitting: *Deleted

## 2013-03-24 ENCOUNTER — Ambulatory Visit (HOSPITAL_BASED_OUTPATIENT_CLINIC_OR_DEPARTMENT_OTHER): Payer: Medicare Other

## 2013-03-24 ENCOUNTER — Other Ambulatory Visit (HOSPITAL_BASED_OUTPATIENT_CLINIC_OR_DEPARTMENT_OTHER): Payer: Medicare Other | Admitting: Lab

## 2013-03-24 ENCOUNTER — Ambulatory Visit (HOSPITAL_BASED_OUTPATIENT_CLINIC_OR_DEPARTMENT_OTHER): Payer: Medicare Other | Admitting: Oncology

## 2013-03-24 ENCOUNTER — Telehealth: Payer: Self-pay | Admitting: Oncology

## 2013-03-24 VITALS — BP 123/62 | HR 66 | Temp 97.2°F | Resp 18 | Ht 70.0 in | Wt 194.5 lb

## 2013-03-24 DIAGNOSIS — C9200 Acute myeloblastic leukemia, not having achieved remission: Secondary | ICD-10-CM

## 2013-03-24 DIAGNOSIS — Z5111 Encounter for antineoplastic chemotherapy: Secondary | ICD-10-CM

## 2013-03-24 DIAGNOSIS — M810 Age-related osteoporosis without current pathological fracture: Secondary | ICD-10-CM

## 2013-03-24 DIAGNOSIS — D46Z Other myelodysplastic syndromes: Secondary | ICD-10-CM

## 2013-03-24 DIAGNOSIS — D61818 Other pancytopenia: Secondary | ICD-10-CM

## 2013-03-24 DIAGNOSIS — D469 Myelodysplastic syndrome, unspecified: Secondary | ICD-10-CM

## 2013-03-24 LAB — CBC WITH DIFFERENTIAL/PLATELET
BASO%: 0 % (ref 0.0–2.0)
EOS%: 3.5 % (ref 0.0–7.0)
LYMPH%: 80.5 % — ABNORMAL HIGH (ref 14.0–49.0)
MCH: 31.5 pg (ref 27.2–33.4)
MCHC: 30.5 g/dL — ABNORMAL LOW (ref 32.0–36.0)
MCV: 103.5 fL — ABNORMAL HIGH (ref 79.3–98.0)
MONO%: 2.7 % (ref 0.0–14.0)
NEUT#: 0.2 10*3/uL — CL (ref 1.5–6.5)
RBC: 2.57 10*6/uL — ABNORMAL LOW (ref 4.20–5.82)
RDW: 16.9 % — ABNORMAL HIGH (ref 11.0–14.6)
nRBC: 0 % (ref 0–0)

## 2013-03-24 MED ORDER — ONDANSETRON HCL 8 MG PO TABS
8.0000 mg | ORAL_TABLET | Freq: Once | ORAL | Status: AC
  Administered 2013-03-24: 8 mg via ORAL

## 2013-03-24 MED ORDER — AZACITIDINE CHEMO SQ INJECTION
100.0000 mg/m2 | Freq: Once | INTRAMUSCULAR | Status: AC
  Administered 2013-03-24: 200 mg via SUBCUTANEOUS
  Filled 2013-03-24: qty 8

## 2013-03-24 NOTE — Progress Notes (Signed)
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   PAST THERAPY: Biopsy only   CURRENT THERAPY:  started on 11/11/2012 Vidaza (hypomethylating chemo) d1-5, d8-9; of every 4 weeks cycle.   INTERVAL HISTORY: Kyle Brewer 79 y.o. male returns for regular follow up with his son.  He no longer has fatigue being off of chemo fore more than 1 month now.  He is independent of all activities of daily living.  He denied fever, SOB, chest pain, abdominal pain, bleeding symptoms.  The rest of the 14-point review of system was negative.      Past Medical History  Diagnosis Date  . Inguinal hernia     right  . Osteoporosis   . Hypothyroid   . Hyperlipidemia   . Arthritis   . BPH (benign prostatic hyperplasia)   . Wound, open, elbow     Right, followed [facility]  . Hypertension   . CAD (coronary artery disease)     3V CABG 1995, relook cath 2001 with patent grafts. normal stress test 2010.   . Back pain   . CKD (chronic kidney disease), stage I   . Bursitis   . Pancytopenia   . Kidney stone   . 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 MDS (myelodysplastic syndrome), high grade     Past Surgical History  Procedure Laterality Date  . Appendectomy  1968  . Coronary artery bypass graft  1995  . Total knee arthroplasty  2007    right  . Elbow arthroscopy  2012    right  . Transurethral resection of prostate      Dr. Isabel Caprice  . Hernia repair  11/07/11    lap bilateral inguinal    Current Outpatient Prescriptions  Medication Sig Dispense Refill  . alendronate (FOSAMAX) 70 MG tablet Take 70 mg by mouth every 7 (seven) days. Take with a full glass of water on an empty stomach.       Marland Kitchen amLODipine (NORVASC) 5 MG tablet Take 5 mg by mouth daily before breakfast.      . Ascorbic Acid (VITAMIN C PO)  Take 500 mg by mouth daily.       Marland Kitchen aspirin 81 MG tablet Take 81 mg by mouth daily.        . Calcium Carbonate-Vitamin D (CALCIUM 600 + D PO) Take 1 tablet by mouth 2 (two) times daily.      . Cyanocobalamin (VITAMIN B12 PO) Take 1,000 mcg by mouth daily.       Marland Kitchen dutasteride (AVODART) 0.5 MG capsule Take 0.5 mg by mouth daily.        Marland Kitchen levothyroxine (SYNTHROID, LEVOTHROID) 50 MCG tablet Take 50 mcg by mouth every evening.       . lidocaine-prilocaine (EMLA) cream Apply topically as needed.  30 g  2  . lisinopril (PRINIVIL,ZESTRIL) 10 MG tablet Take 10 mg by mouth every evening.       Marland Kitchen LORazepam (ATIVAN) 0.5 MG tablet Take 0.5 mg by mouth at bedtime as needed for anxiety (sleep).       . meloxicam (MOBIC) 7.5 MG tablet Take 7.5 mg by mouth 2 (two) times daily.       . metoprolol succinate (TOPROL-XL) 100 MG 24 hr tablet Take 100 mg by mouth daily before breakfast. Take  with or immediately following a meal.      . Multiple Vitamin (MULTIVITAMIN WITH MINERALS) TABS Take 1 tablet by mouth daily.      . simvastatin (ZOCOR) 20 MG tablet Take 20 mg by mouth at bedtime.        Marland Kitchen terazosin (HYTRIN) 10 MG capsule Take 10 mg by mouth at bedtime.         No current facility-administered medications for this visit.    ALLERGIES:  has No Known Allergies.  REVIEW OF SYSTEMS:  The rest of the 14-point review of system was negative.   Filed Vitals:   03/24/13 0922  BP: 123/62  Pulse: 66  Temp: 97.2 F (36.2 C)  Resp: 18   Wt Readings from Last 3 Encounters:  03/24/13 194 lb 8 oz (88.225 kg)  03/03/13 189 lb 9.6 oz (86.002 kg)  02/20/13 189 lb (85.73 kg)   ECOG Performance status: 0-1  PHYSICAL EXAMINATION:   General:  well-nourished man, in no acute distress.  Eyes:  no scleral icterus.  ENT:  There were no oropharyngeal lesions.  Neck was without thyromegaly.  Lymphatics:  Negative cervical, supraclavicular or axillary adenopathy.  Respiratory: lungs were clear bilaterally without wheezing or  crackles.  Cardiovascular:  Regular rate and rhythm, S1/S2, without rub or gallop. There was a II/VI systolic murmur best heard in the left upper sternal border. There was 1+ bilateral pedal edema.  GI:  abdomen was soft, flat, nontender, nondistended, without organomegaly.  Muscoloskeletal:  no spinal tenderness of palpation of vertebral spine.  Skin exam was without petichae. There was some ecchymosis on the skin over the abdomen where he had Vidaza injection last week.  There was pain, erythema to palpation.  Neuro exam was nonfocal.  Patient was able to get on and off exam table without assistance.  Gait was normal.  Patient was alerted and oriented.  Attention was good.   Language was appropriate.  Mood was normal without depression.  Speech was not pressured.  Thought content was not tangential.     LABORATORY/RADIOLOGY DATA:  Lab Results  Component Value Date   WBC 1.1* 03/24/2013   HGB 8.1* 03/24/2013   HCT 26.6* 03/24/2013   PLT 78* 03/24/2013   GLUCOSE 103* 02/03/2013   ALKPHOS 68 02/03/2013   ALT 15 02/03/2013   AST 11 02/03/2013   NA 142 02/03/2013   K 4.4 02/03/2013   CL 110* 02/03/2013   CREATININE 1.8* 02/03/2013   BUN 36.9* 02/03/2013   CO2 24 02/03/2013   INR 1.07 02/20/2013     ASSESSMENT AND PLAN:   1. Hypertension: He is on amlodipine, lisinopril, metoprolol.  2. Hyperlipidemia: He is on simvastatin.  3. CAD: He is on ASA, simvastatin, lisinopril, and metoprolol.  4. Hypothyroidism: He is on Synthroid.  5. BPH: He is on dutasteride and terazosin.  6. Osteoporosis: He is on Fosamax.  7. 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  He would like to go ahead and start higher dose of Vidaza per Dr. Michae Kava Powell's recommendation.  We will repeat bone marrow biopsy after 2 more cycles.  He tolerated chemo relatively well with grade 2 fatigue.    8.  Pancytopenia:  Due to MDS/AML.  There is no active  bleeding.  There is no indication for transfusion today.  Goal of transfusion Hgb <7 or 8; Plt <10K or active bleeding.  Will check his CBC every week.  9 . Code status: FULL CODE.   The length of time of the face-to-face encounter was 15 minutes. More than 50% of time was spent counseling and coordination of care.

## 2013-03-24 NOTE — Patient Instructions (Signed)
[facility] Discharge Instructions for Patients Receiving Chemotherapy  Today you received the following chemotherapy agents vidaza  To help prevent nausea and vomiting after your treatment, we encourage you to take your nausea medication as needed   If you develop nausea and vomiting that is not controlled by your nausea medication, call the clinic. If it is after clinic hours your family physician or the after hours number for the clinic or go to the Emergency Department.   BELOW ARE SYMPTOMS THAT SHOULD BE REPORTED IMMEDIATELY:  *FEVER GREATER THAN 100.5 F  *CHILLS WITH OR WITHOUT FEVER  NAUSEA AND VOMITING THAT IS NOT CONTROLLED WITH YOUR NAUSEA MEDICATION  *UNUSUAL SHORTNESS OF BREATH  *UNUSUAL BRUISING OR BLEEDING  TENDERNESS IN MOUTH AND THROAT WITH OR WITHOUT PRESENCE OF ULCERS  *URINARY PROBLEMS  *BOWEL PROBLEMS  UNUSUAL RASH Items with * indicate a potential emergency and should be followed up as soon as possible.  The clinic phone number is [phone].

## 2013-03-24 NOTE — Progress Notes (Signed)
Per Dr Gaylyn Rong, okay to tx with CBC counts 3/31.  SLJ

## 2013-03-24 NOTE — Patient Instructions (Addendum)
1.  Diagnosis:  MDS-AML:  No response with typical dose of Vidaza.  Recommendation from Dr. Lowell Guitar from [facility] was to increase to slightly higher dose and repeat bone marrow biopsy after 2 months.

## 2013-03-24 NOTE — Telephone Encounter (Signed)
Per staff message and POF I have scheduled appts.  JMW  

## 2013-03-25 ENCOUNTER — Ambulatory Visit (HOSPITAL_BASED_OUTPATIENT_CLINIC_OR_DEPARTMENT_OTHER): Payer: Medicare Other

## 2013-03-25 ENCOUNTER — Encounter (HOSPITAL_COMMUNITY)
Admission: RE | Admit: 2013-03-25 | Discharge: 2013-03-25 | Disposition: A | Discharge status: Home / Self Care | Payer: Medicare Other | Source: Ambulatory Visit | Attending: Oncology | Admitting: Oncology

## 2013-03-25 VITALS — BP 126/65 | HR 62 | Temp 97.2°F

## 2013-03-25 DIAGNOSIS — D649 Anemia, unspecified: Secondary | ICD-10-CM | POA: Insufficient documentation

## 2013-03-25 DIAGNOSIS — D46Z Other myelodysplastic syndromes: Secondary | ICD-10-CM

## 2013-03-25 DIAGNOSIS — Z5111 Encounter for antineoplastic chemotherapy: Secondary | ICD-10-CM

## 2013-03-25 DIAGNOSIS — C9200 Acute myeloblastic leukemia, not having achieved remission: Secondary | ICD-10-CM

## 2013-03-25 MED ORDER — AZACITIDINE CHEMO SQ INJECTION
200.0000 mg | Freq: Once | INTRAMUSCULAR | Status: AC
  Administered 2013-03-25: 200 mg via SUBCUTANEOUS
  Filled 2013-03-25: qty 8

## 2013-03-25 MED ORDER — ONDANSETRON HCL 8 MG PO TABS
8.0000 mg | ORAL_TABLET | Freq: Once | ORAL | Status: AC
  Administered 2013-03-25: 8 mg via ORAL

## 2013-03-25 MED ORDER — AZACITIDINE CHEMO SQ INJECTION
100.0000 mg/m2 | Freq: Once | INTRAMUSCULAR | Status: DC
  Filled 2013-03-25: qty 8

## 2013-03-25 NOTE — Progress Notes (Signed)
Vidaza 200 mg given via four injections to left upper abdomen.  Patient tolerated four injections well.

## 2013-03-25 NOTE — Progress Notes (Signed)
Discharged alone and ambulatory at 11:43.  Going out to meet his wife for lunch.

## 2013-03-25 NOTE — Patient Instructions (Signed)
[facility] Discharge Instructions for Patients Receiving Chemotherapy  Today you received the following chemotherapy agents Vidaza  To help prevent nausea and vomiting after your treatment, we encourage you to take your nausea medication Lorazepam Begin taking it at anytime as ordered by Dr. Gaylyn Rong and take it as often as prescribed for the next 72 hours then take only as needed.   If you develop nausea and vomiting that is not controlled by your nausea medication, call the clinic. If it is after clinic hours your family physician or the after hours number for the clinic or go to the Emergency Department.   BELOW ARE SYMPTOMS THAT SHOULD BE REPORTED IMMEDIATELY:  *FEVER GREATER THAN 100.5 F  *CHILLS WITH OR WITHOUT FEVER  NAUSEA AND VOMITING THAT IS NOT CONTROLLED WITH YOUR NAUSEA MEDICATION  *UNUSUAL SHORTNESS OF BREATH  *UNUSUAL BRUISING OR BLEEDING  TENDERNESS IN MOUTH AND THROAT WITH OR WITHOUT PRESENCE OF ULCERS  *URINARY PROBLEMS  *BOWEL PROBLEMS  UNUSUAL RASH Items with * indicate a potential emergency and should be followed up as soon as possible.  Please call to let a nurse know about any problems that you may have experienced. Feel free to call the clinic you have any questions or concerns. The clinic phone number is [phone].   I have been informed and understand all the instructions given to me. I know to contact the clinic, my physician, or go to the Emergency Department if any problems should occur. I do not have any questions at this time, but understand that I may call the clinic during office hours   should I have any questions or need assistance in obtaining follow up care.    __________________________________________  _____________  __________ Signature of Patient or Authorized Representative            Date                   Time    __________________________________________ Nurse's Signature

## 2013-03-26 ENCOUNTER — Ambulatory Visit (HOSPITAL_BASED_OUTPATIENT_CLINIC_OR_DEPARTMENT_OTHER): Payer: Medicare Other

## 2013-03-26 VITALS — BP 109/56 | HR 70 | Temp 98.2°F

## 2013-03-26 DIAGNOSIS — D46Z Other myelodysplastic syndromes: Secondary | ICD-10-CM

## 2013-03-26 DIAGNOSIS — C9200 Acute myeloblastic leukemia, not having achieved remission: Secondary | ICD-10-CM

## 2013-03-26 DIAGNOSIS — Z5111 Encounter for antineoplastic chemotherapy: Secondary | ICD-10-CM

## 2013-03-26 MED ORDER — AZACITIDINE CHEMO SQ INJECTION
100.0000 mg/m2 | Freq: Once | INTRAMUSCULAR | Status: AC
  Administered 2013-03-26: 200 mg via SUBCUTANEOUS
  Filled 2013-03-26: qty 8

## 2013-03-26 MED ORDER — ONDANSETRON HCL 8 MG PO TABS
8.0000 mg | ORAL_TABLET | Freq: Once | ORAL | Status: AC
  Administered 2013-03-26: 8 mg via ORAL

## 2013-03-26 NOTE — Patient Instructions (Addendum)
Azacitidine suspension for injection (subcutaneous use) What is this medicine? AZACITIDINE (ay za SITE i deen) is a chemotherapy drug. This medicine reduces the growth of cancer cells and can suppress the immune system. It is used for treating myelodysplastic syndrome or some types of leukemia. This medicine may be used for other purposes; ask your health care provider or pharmacist if you have questions. What should I tell my health care provider before I take this medicine? They need to know if you have any of these conditions: -infection (especially a virus infection such as chickenpox, cold sores, or herpes) -kidney disease -liver disease -liver tumors -an unusual or allergic reaction to azacitidine, mannitol, other medicines, foods, dyes, or preservatives -pregnant or trying to get pregnant -breast-feeding How should I use this medicine? This medicine is for injection under the skin. It is administered in a hospital or clinic by a specially trained health care professional. Talk to your pediatrician regarding the use of this medicine in children. While this drug may be prescribed for selected conditions, precautions do apply. Overdosage: If you think you have taken too much of this medicine contact a poison control center or emergency room at once. NOTE: This medicine is only for you. Do not share this medicine with others. What if I miss a dose? It is important not to miss your dose. Call your doctor or health care professional if you are unable to keep an appointment. What may interact with this medicine? -vaccines Talk to your doctor or health care professional before taking any of these medicines: -acetaminophen -aspirin -ibuprofen -ketoprofen -naproxen This list may not describe all possible interactions. Give your health care provider a list of all the medicines, herbs, non-prescription drugs, or dietary supplements you use. Also tell them if you smoke, drink alcohol, or use  illegal drugs. Some items may interact with your medicine. What should I watch for while using this medicine? Visit your doctor for checks on your progress. This drug may make you feel generally unwell. This is not uncommon, as chemotherapy can affect healthy cells as well as cancer cells. Report any side effects. Continue your course of treatment even though you feel ill unless your doctor tells you to stop. In some cases, you may be given additional medicines to help with side effects. Follow all directions for their use. Call your doctor or health care professional for advice if you get a fever, chills or sore throat, or other symptoms of a cold or flu. Do not treat yourself. This drug decreases your body's ability to fight infections. Try to avoid being around people who are sick. This medicine may increase your risk to bruise or bleed. Call your doctor or health care professional if you notice any unusual bleeding. Be careful brushing and flossing your teeth or using a toothpick because you may get an infection or bleed more easily. If you have any dental work done, tell your dentist you are receiving this medicine. Avoid taking products that contain aspirin, acetaminophen, ibuprofen, naproxen, or ketoprofen unless instructed by your doctor. These medicines may hide a fever. Do not have any vaccinations without your doctor's approval and avoid anyone who has recently had oral polio vaccine. Do not become pregnant while taking this medicine. Women should inform their doctor if they wish to become pregnant or think they might be pregnant. There is a potential for serious side effects to an unborn child. Talk to your health care professional or pharmacist for more information. Do not breast-feed an   infant while taking this medicine. If you are a man, you should not father a child while receiving treatment. What side effects may I notice from receiving this medicine? Side effects that you should report  to your doctor or health care professional as soon as possible: -allergic reactions like skin rash, itching or hives, swelling of the face, lips, or tongue -low blood counts - this medicine may decrease the number of white blood cells, red blood cells and platelets. You may be at increased risk for infections and bleeding. -signs of infection - fever or chills, cough, sore throat, pain or difficulty passing urine -signs of decreased platelets or bleeding - bruising, pinpoint red spots on the skin, black, tarry stools, blood in the urine -signs of decreased red blood cells - unusually weak or tired, fainting spells, lightheadedness -reactions at the injection site including redness, pain, itching, or bruising -breathing problems -changes in vision -fever -mouth sores -stomach pain -vomiting Side effects that usually do not require medical attention (report to your doctor or health care professional if they continue or are bothersome): -constipation -diarrhea -loss of appetite -nausea -pain or redness at the injection site -weak or tired This list may not describe all possible side effects. Call your doctor for medical advice about side effects. You may report side effects to FDA at 1-800-FDA-1088. Where should I keep my medicine? This drug is given in a hospital or clinic and will not be stored at home. NOTE: This sheet is a summary. It may not cover all possible information. If you have questions about this medicine, talk to your doctor, pharmacist, or health care provider.  2013, [employer]. (03/05/2008 11:04:07 AM)  

## 2013-03-27 ENCOUNTER — Encounter: Payer: Self-pay | Admitting: Cardiovascular Disease

## 2013-03-27 ENCOUNTER — Ambulatory Visit (HOSPITAL_BASED_OUTPATIENT_CLINIC_OR_DEPARTMENT_OTHER): Payer: Medicare Other

## 2013-03-27 ENCOUNTER — Ambulatory Visit (INDEPENDENT_AMBULATORY_CARE_PROVIDER_SITE_OTHER): Payer: Medicare Other | Admitting: Cardiovascular Disease

## 2013-03-27 VITALS — BP 116/62 | HR 58 | Ht 68.0 in | Wt 193.0 lb

## 2013-03-27 VITALS — BP 108/64 | HR 61 | Temp 96.9°F

## 2013-03-27 DIAGNOSIS — D46Z Other myelodysplastic syndromes: Secondary | ICD-10-CM

## 2013-03-27 DIAGNOSIS — C9200 Acute myeloblastic leukemia, not having achieved remission: Secondary | ICD-10-CM

## 2013-03-27 DIAGNOSIS — Z5111 Encounter for antineoplastic chemotherapy: Secondary | ICD-10-CM

## 2013-03-27 DIAGNOSIS — R011 Cardiac murmur, unspecified: Secondary | ICD-10-CM

## 2013-03-27 DIAGNOSIS — I10 Essential (primary) hypertension: Secondary | ICD-10-CM

## 2013-03-27 DIAGNOSIS — I2510 Atherosclerotic heart disease of native coronary artery without angina pectoris: Secondary | ICD-10-CM

## 2013-03-27 MED ORDER — AZACITIDINE CHEMO SQ INJECTION
100.0000 mg/m2 | Freq: Once | INTRAMUSCULAR | Status: AC
  Administered 2013-03-27: 200 mg via SUBCUTANEOUS
  Filled 2013-03-27: qty 8

## 2013-03-27 MED ORDER — ONDANSETRON HCL 8 MG PO TABS
8.0000 mg | ORAL_TABLET | Freq: Once | ORAL | Status: AC
  Administered 2013-03-27: 8 mg via ORAL

## 2013-03-27 NOTE — Patient Instructions (Addendum)
Your physician wants you to follow-up in:  12 months.  You will receive a reminder letter in the mail two months in advance. If you don't receive a letter, please call our office to schedule the follow-up appointment.   

## 2013-03-27 NOTE — Patient Instructions (Addendum)
[facility] Discharge Instructions for Patients Receiving Chemotherapy  Today you received the following chemotherapy agents: vidaza  To help prevent nausea and vomiting after your treatment, we encourage you to take your nausea medication.  Take it as often as prescribed.     If you develop nausea and vomiting that is not controlled by your nausea medication, call the clinic. If it is after clinic hours your family physician or the after hours number for the clinic or go to the Emergency Department.   BELOW ARE SYMPTOMS THAT SHOULD BE REPORTED IMMEDIATELY:  *FEVER GREATER THAN 100.5 F  *CHILLS WITH OR WITHOUT FEVER  NAUSEA AND VOMITING THAT IS NOT CONTROLLED WITH YOUR NAUSEA MEDICATION  *UNUSUAL SHORTNESS OF BREATH  *UNUSUAL BRUISING OR BLEEDING  TENDERNESS IN MOUTH AND THROAT WITH OR WITHOUT PRESENCE OF ULCERS  *URINARY PROBLEMS  *BOWEL PROBLEMS  UNUSUAL RASH Items with * indicate a potential emergency and should be followed up as soon as possible.  Feel free to call the clinic you have any questions or concerns. The clinic phone number is [phone].   I have been informed and understand all the instructions given to me. I know to contact the clinic, my physician, or go to the Emergency Department if any problems should occur. I do not have any questions at this time, but understand that I may call the clinic during office hours   should I have any questions or need assistance in obtaining follow up care.    __________________________________________  _____________  __________ Signature of Patient or Authorized Representative            Date                   Time    __________________________________________ Nurse's Signature    

## 2013-03-27 NOTE — Progress Notes (Signed)
Per Dr Gaylyn Rong 3/31 - ok to treat this cycle with 3/31 labs.

## 2013-03-27 NOTE — Progress Notes (Signed)
History of Present Illness: 79 yo WM with history of CAD, HTN, HLD, AML, myelodysplastic syndrome here today for cardiac follow up. He has been followed in the past by Dr. Juanda Chance. In 1995 he underwent 3V CABG per Dr. Andrey Campanile. In 2001 he had a cath with patent grafts. He had a normal stress myoview 2010 with an ejection fraction of 64%. He has been undergoing chemotherapy per Dr. Gaylyn Rong for he AML/MDS.   He is here today for cardiac follow up. He denies chest pain or SOB. His breathing has been at baseline. He is feeling weak with chemotherapy. No dizziness, near syncope or syncope .  Primary Care Physician: Dr. Timothy Lasso  Last Lipid Profile: Followed in primary care.   Past Medical History  Diagnosis Date  . Inguinal hernia     right  . Osteoporosis   . Hypothyroid   . Hyperlipidemia   . Arthritis   . BPH (benign prostatic hyperplasia)   . Wound, open, elbow     Right, followed [facility]  . Hypertension   . CAD (coronary artery disease)     3V CABG 1995, relook cath 2001 with patent grafts. normal stress test 2010.   . Back pain   . CKD (chronic kidney disease), stage I   . Bursitis   . Pancytopenia   . Kidney stone   . 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 MDS (myelodysplastic syndrome), high grade     Past Surgical History  Procedure Laterality Date  . Appendectomy  1968  . Coronary artery bypass graft  1995  . Total knee arthroplasty  2007    right  . Elbow arthroscopy  2012    right  . Transurethral resection of prostate      Dr. Isabel Caprice  . Hernia repair  11/07/11    lap bilateral inguinal    Current Outpatient Prescriptions  Medication Sig Dispense Refill  . alendronate (FOSAMAX) 70 MG tablet Take 70 mg by mouth every 7 (seven) days. Take with a full glass of water on an empty stomach.       Marland Kitchen amLODipine (NORVASC) 5 MG tablet Take 5 mg by mouth daily before breakfast.      . Ascorbic Acid (VITAMIN C PO) Take 500 mg by mouth daily.       Marland Kitchen aspirin 81 MG tablet Take  81 mg by mouth daily.        . Calcium Carbonate-Vitamin D (CALCIUM 600 + D PO) Take 1 tablet by mouth 2 (two) times daily.      Marland Kitchen dutasteride (AVODART) 0.5 MG capsule Take 0.5 mg by mouth daily.        Marland Kitchen levothyroxine (SYNTHROID, LEVOTHROID) 50 MCG tablet Take 50 mcg by mouth every evening.       . lidocaine-prilocaine (EMLA) cream Apply topically as needed.  30 g  2  . lisinopril (PRINIVIL,ZESTRIL) 10 MG tablet Take 10 mg by mouth every evening.       Marland Kitchen LORazepam (ATIVAN) 0.5 MG tablet Take 0.5 mg by mouth at bedtime as needed for anxiety (sleep).       . meloxicam (MOBIC) 7.5 MG tablet Take 7.5 mg by mouth 2 (two) times daily.       . metoprolol succinate (TOPROL-XL) 100 MG 24 hr tablet Take 100 mg by mouth daily before breakfast. Take with or immediately following a meal.      . Multiple Vitamin (MULTIVITAMIN WITH MINERALS) TABS Take 1 tablet by mouth daily.      Marland Kitchen  simvastatin (ZOCOR) 20 MG tablet Take 20 mg by mouth at bedtime.        Marland Kitchen terazosin (HYTRIN) 10 MG capsule Take 10 mg by mouth at bedtime.        . Cyanocobalamin (VITAMIN B12 PO) Take 1,000 mcg by mouth daily.        No current facility-administered medications for this visit.    No Known Allergies  History   Social History  . Marital Status: Widowed    Spouse Name: N/A    Number of Children: 3  . Years of Education: N/A   Occupational History  .      retired [employer] office   Social History Main Topics  . Smoking status: Former Smoker -- 0.50 packs/day for 10 years    Quit date: 12/26/1963  . Smokeless tobacco: Never Used     Comment: quit 1965  . Alcohol Use: No  . Drug Use: No  . Sexually Active: Not on file   Other Topics Concern  . Not on file   Social History Narrative  . No narrative on file    Family History  Problem Relation Age of Onset  . Cancer Father     lung  . Heart failure Mother   . Cancer Brother     lung     Review of Systems:  As stated in the HPI and otherwise negative.   BP  116/62  Pulse 58  Ht 5\' 8"  (1.727 m)  Wt 193 lb (87.544 kg)  BMI 29.35 kg/m2  SpO2 97%  Physical Examination: General: Well developed, well nourished, NAD HEENT: OP clear, mucus membranes moist SKIN: warm, dry. No rashes. Neuro: No focal deficits Musculoskeletal: Muscle strength 5/5 all ext Psychiatric: Mood and affect normal Neck: No JVD, no carotid bruits, no thyromegaly, no lymphadenopathy. Lungs:Clear bilaterally, no wheezes, rhonci, crackles Cardiovascular: Huston Foley, regular with soft systolic murmur.  Abdomen:Soft. Bowel sounds present. Non-tender.  Extremities: No lower extremity edema. Pulses are 2 + in the bilateral DP/PT.  EKG: Sinus brady, rate 58 bpm. 1st degree AV block.   Assessment and Plan:   1. CAD: Stable. No changes in therapy. Lipids are followed in primary care. Recent LDL 69.   2. HTN: BP controlled.   3. Systolic murmur: soft. Likely aortic stenosis. He does not wish to have an echo at this time. Will discuss at next visit.

## 2013-03-28 ENCOUNTER — Ambulatory Visit (HOSPITAL_BASED_OUTPATIENT_CLINIC_OR_DEPARTMENT_OTHER): Payer: Medicare Other

## 2013-03-28 VITALS — BP 108/57 | HR 62 | Temp 98.6°F

## 2013-03-28 DIAGNOSIS — D46Z Other myelodysplastic syndromes: Secondary | ICD-10-CM

## 2013-03-28 DIAGNOSIS — Z5111 Encounter for antineoplastic chemotherapy: Secondary | ICD-10-CM

## 2013-03-28 DIAGNOSIS — C9200 Acute myeloblastic leukemia, not having achieved remission: Secondary | ICD-10-CM

## 2013-03-28 MED ORDER — ONDANSETRON HCL 8 MG PO TABS
8.0000 mg | ORAL_TABLET | Freq: Once | ORAL | Status: AC
  Administered 2013-03-28: 8 mg via ORAL

## 2013-03-28 MED ORDER — AZACITIDINE CHEMO SQ INJECTION
100.0000 mg/m2 | Freq: Once | INTRAMUSCULAR | Status: AC
  Administered 2013-03-28: 200 mg via SUBCUTANEOUS
  Filled 2013-03-28: qty 8

## 2013-03-28 NOTE — Patient Instructions (Signed)
[facility] Discharge Instructions for Patients Receiving Chemotherapy  Today you received the following chemotherapy agents  Vidaza To help prevent nausea and vomiting after your treatment, we encourage you to take your nausea medication   Take it as often as prescribed.   If you develop nausea and vomiting that is not controlled by your nausea medication, call the clinic. If it is after clinic hours your family physician or the after hours number for the clinic or go to the Emergency Department.   BELOW ARE SYMPTOMS THAT SHOULD BE REPORTED IMMEDIATELY:  *FEVER GREATER THAN 100.5 F  *CHILLS WITH OR WITHOUT FEVER  NAUSEA AND VOMITING THAT IS NOT CONTROLLED WITH YOUR NAUSEA MEDICATION  *UNUSUAL SHORTNESS OF BREATH  *UNUSUAL BRUISING OR BLEEDING  TENDERNESS IN MOUTH AND THROAT WITH OR WITHOUT PRESENCE OF ULCERS  *URINARY PROBLEMS  *BOWEL PROBLEMS  UNUSUAL RASH Items with * indicate a potential emergency and should be followed up as soon as possible.  If this is your first treatment one of the nurses will contact you 24 hours after your treatment. Please let the nurse know about any problems that you may have experienced. Feel free to call the clinic you have any questions or concerns. The clinic phone number is [phone].   I have been informed and understand all the instructions given to me. I know to contact the clinic, my physician, or go to the Emergency Department if any problems should occur. I do not have any questions at this time, but understand that I may call the clinic during office hours   should I have any questions or need assistance in obtaining follow up care.    __________________________________________  _____________  __________ Signature of Patient or Authorized Representative            Date                   Time    __________________________________________ Nurse's Signature

## 2013-03-31 ENCOUNTER — Other Ambulatory Visit (HOSPITAL_BASED_OUTPATIENT_CLINIC_OR_DEPARTMENT_OTHER): Payer: Medicare Other | Admitting: Lab

## 2013-03-31 ENCOUNTER — Ambulatory Visit (HOSPITAL_BASED_OUTPATIENT_CLINIC_OR_DEPARTMENT_OTHER): Payer: Medicare Other

## 2013-03-31 VITALS — BP 136/64 | HR 61 | Temp 98.0°F

## 2013-03-31 DIAGNOSIS — D469 Myelodysplastic syndrome, unspecified: Secondary | ICD-10-CM

## 2013-03-31 DIAGNOSIS — C9200 Acute myeloblastic leukemia, not having achieved remission: Secondary | ICD-10-CM

## 2013-03-31 DIAGNOSIS — D46Z Other myelodysplastic syndromes: Secondary | ICD-10-CM

## 2013-03-31 LAB — CBC WITH DIFFERENTIAL/PLATELET
BASO%: 2.4 % — ABNORMAL HIGH (ref 0.0–2.0)
EOS%: 2.2 % (ref 0.0–7.0)
MCH: 33 pg (ref 27.2–33.4)
MCHC: 32.2 g/dL (ref 32.0–36.0)
MONO#: 0 10*3/uL — ABNORMAL LOW (ref 0.1–0.9)
RBC: 2.61 10*6/uL — ABNORMAL LOW (ref 4.20–5.82)
WBC: 1.4 10*3/uL — ABNORMAL LOW (ref 4.0–10.3)
lymph#: 1.2 10*3/uL (ref 0.9–3.3)

## 2013-03-31 MED ORDER — AZACITIDINE CHEMO SQ INJECTION
100.0000 mg/m2 | Freq: Once | INTRAMUSCULAR | Status: AC
  Administered 2013-03-31: 200 mg via SUBCUTANEOUS
  Filled 2013-03-31: qty 8

## 2013-03-31 MED ORDER — ONDANSETRON HCL 8 MG PO TABS
8.0000 mg | ORAL_TABLET | Freq: Once | ORAL | Status: AC
  Administered 2013-03-31: 8 mg via ORAL

## 2013-03-31 NOTE — Progress Notes (Signed)
Patient with issues of constipation.  Recommended that patient begin taking Colace daily for constipation. Told patient to call office if any other questions or concerns.  Patient verbalized understanding.

## 2013-04-01 ENCOUNTER — Ambulatory Visit (HOSPITAL_BASED_OUTPATIENT_CLINIC_OR_DEPARTMENT_OTHER): Payer: Medicare Other

## 2013-04-01 VITALS — BP 115/65 | HR 60 | Temp 97.0°F | Resp 17

## 2013-04-01 DIAGNOSIS — C9200 Acute myeloblastic leukemia, not having achieved remission: Secondary | ICD-10-CM

## 2013-04-01 DIAGNOSIS — Z5111 Encounter for antineoplastic chemotherapy: Secondary | ICD-10-CM

## 2013-04-01 DIAGNOSIS — D46Z Other myelodysplastic syndromes: Secondary | ICD-10-CM

## 2013-04-01 MED ORDER — AZACITIDINE CHEMO SQ INJECTION
100.0000 mg/m2 | Freq: Once | INTRAMUSCULAR | Status: AC
Start: 2013-04-01 — End: 2013-04-01
  Administered 2013-04-01: 200 mg via SUBCUTANEOUS
  Filled 2013-04-01: qty 8

## 2013-04-01 MED ORDER — ONDANSETRON HCL 8 MG PO TABS
8.0000 mg | ORAL_TABLET | Freq: Once | ORAL | Status: AC
  Administered 2013-04-01: 8 mg via ORAL

## 2013-04-01 NOTE — Progress Notes (Signed)
Ok to proceed with labs from 06-30-13 per dr ha.   dmr

## 2013-04-01 NOTE — Patient Instructions (Addendum)
Azacitidine suspension for injection (subcutaneous use) What is this medicine? AZACITIDINE (ay za SITE i deen) is a chemotherapy drug. This medicine reduces the growth of cancer cells and can suppress the immune system. It is used for treating myelodysplastic syndrome or some types of leukemia. This medicine may be used for other purposes; ask your health care provider or pharmacist if you have questions. What should I tell my health care provider before I take this medicine? They need to know if you have any of these conditions: -infection (especially a virus infection such as chickenpox, cold sores, or herpes) -kidney disease -liver disease -liver tumors -an unusual or allergic reaction to azacitidine, mannitol, other medicines, foods, dyes, or preservatives -pregnant or trying to get pregnant -breast-feeding How should I use this medicine? This medicine is for injection under the skin. It is administered in a hospital or clinic by a specially trained health care professional. Talk to your pediatrician regarding the use of this medicine in children. While this drug may be prescribed for selected conditions, precautions do apply. Overdosage: If you think you have taken too much of this medicine contact a poison control center or emergency room at once. NOTE: This medicine is only for you. Do not share this medicine with others. What if I miss a dose? It is important not to miss your dose. Call your doctor or health care professional if you are unable to keep an appointment. What may interact with this medicine? -vaccines Talk to your doctor or health care professional before taking any of these medicines: -acetaminophen -aspirin -ibuprofen -ketoprofen -naproxen This list may not describe all possible interactions. Give your health care provider a list of all the medicines, herbs, non-prescription drugs, or dietary supplements you use. Also tell them if you smoke, drink alcohol, or use  illegal drugs. Some items may interact with your medicine. What should I watch for while using this medicine? Visit your doctor for checks on your progress. This drug may make you feel generally unwell. This is not uncommon, as chemotherapy can affect healthy cells as well as cancer cells. Report any side effects. Continue your course of treatment even though you feel ill unless your doctor tells you to stop. In some cases, you may be given additional medicines to help with side effects. Follow all directions for their use. Call your doctor or health care professional for advice if you get a fever, chills or sore throat, or other symptoms of a cold or flu. Do not treat yourself. This drug decreases your body's ability to fight infections. Try to avoid being around people who are sick. This medicine may increase your risk to bruise or bleed. Call your doctor or health care professional if you notice any unusual bleeding. Be careful brushing and flossing your teeth or using a toothpick because you may get an infection or bleed more easily. If you have any dental work done, tell your dentist you are receiving this medicine. Avoid taking products that contain aspirin, acetaminophen, ibuprofen, naproxen, or ketoprofen unless instructed by your doctor. These medicines may hide a fever. Do not have any vaccinations without your doctor's approval and avoid anyone who has recently had oral polio vaccine. Do not become pregnant while taking this medicine. Women should inform their doctor if they wish to become pregnant or think they might be pregnant. There is a potential for serious side effects to an unborn child. Talk to your health care professional or pharmacist for more information. Do not breast-feed an   infant while taking this medicine. If you are a man, you should not father a child while receiving treatment. What side effects may I notice from receiving this medicine? Side effects that you should report  to your doctor or health care professional as soon as possible: -allergic reactions like skin rash, itching or hives, swelling of the face, lips, or tongue -low blood counts - this medicine may decrease the number of white blood cells, red blood cells and platelets. You may be at increased risk for infections and bleeding. -signs of infection - fever or chills, cough, sore throat, pain or difficulty passing urine -signs of decreased platelets or bleeding - bruising, pinpoint red spots on the skin, black, tarry stools, blood in the urine -signs of decreased red blood cells - unusually weak or tired, fainting spells, lightheadedness -reactions at the injection site including redness, pain, itching, or bruising -breathing problems -changes in vision -fever -mouth sores -stomach pain -vomiting Side effects that usually do not require medical attention (report to your doctor or health care professional if they continue or are bothersome): -constipation -diarrhea -loss of appetite -nausea -pain or redness at the injection site -weak or tired This list may not describe all possible side effects. Call your doctor for medical advice about side effects. You may report side effects to FDA at 1-800-FDA-1088. Where should I keep my medicine? This drug is given in a hospital or clinic and will not be stored at home. NOTE: This sheet is a summary. It may not cover all possible information. If you have questions about this medicine, talk to your doctor, pharmacist, or health care provider.  2013, [employer]. (03/05/2008 11:04:07 AM)  

## 2013-04-07 ENCOUNTER — Other Ambulatory Visit (HOSPITAL_BASED_OUTPATIENT_CLINIC_OR_DEPARTMENT_OTHER): Payer: Medicare Other

## 2013-04-07 ENCOUNTER — Ambulatory Visit: Payer: Medicare Other

## 2013-04-07 DIAGNOSIS — D469 Myelodysplastic syndrome, unspecified: Secondary | ICD-10-CM

## 2013-04-07 DIAGNOSIS — C9200 Acute myeloblastic leukemia, not having achieved remission: Secondary | ICD-10-CM

## 2013-04-07 LAB — CBC WITH DIFFERENTIAL/PLATELET
BASO%: 0 % (ref 0.0–2.0)
Basophils Absolute: 0 10*3/uL (ref 0.0–0.1)
EOS%: 1.7 % (ref 0.0–7.0)
HCT: 26.1 % — ABNORMAL LOW (ref 38.4–49.9)
HGB: 8.2 g/dL — ABNORMAL LOW (ref 13.0–17.1)
MCHC: 31.4 g/dL — ABNORMAL LOW (ref 32.0–36.0)
MONO#: 0.1 10*3/uL (ref 0.1–0.9)
NEUT%: 21.1 % — ABNORMAL LOW (ref 39.0–75.0)
RDW: 16 % — ABNORMAL HIGH (ref 11.0–14.6)
WBC: 1.8 10*3/uL — ABNORMAL LOW (ref 4.0–10.3)
lymph#: 1.3 10*3/uL (ref 0.9–3.3)

## 2013-04-07 NOTE — Progress Notes (Signed)
No transfusion needed today, d/t hgb 8.2, per MD note/ desk RN.  Informed pt of this, and he verbalizes understanding.  Pt denies any bleeding.  Reviewed neutropenic precautions with pt.  Pt aware of next appt.  Desk RN to make MD aware of labs today.

## 2013-04-11 NOTE — Progress Notes (Signed)
Pt vidaza given in 4 injections.Marland Kitchenlate entry.  dmr

## 2013-04-14 ENCOUNTER — Other Ambulatory Visit (HOSPITAL_BASED_OUTPATIENT_CLINIC_OR_DEPARTMENT_OTHER): Payer: Medicare Other | Admitting: Lab

## 2013-04-14 ENCOUNTER — Ambulatory Visit: Payer: Medicare Other

## 2013-04-14 ENCOUNTER — Other Ambulatory Visit: Payer: Self-pay | Admitting: Oncology

## 2013-04-14 DIAGNOSIS — D469 Myelodysplastic syndrome, unspecified: Secondary | ICD-10-CM

## 2013-04-14 DIAGNOSIS — C9200 Acute myeloblastic leukemia, not having achieved remission: Secondary | ICD-10-CM

## 2013-04-14 DIAGNOSIS — D46Z Other myelodysplastic syndromes: Secondary | ICD-10-CM

## 2013-04-14 LAB — CBC WITH DIFFERENTIAL/PLATELET
Basophils Absolute: 0.1 10*3/uL (ref 0.0–0.1)
Eosinophils Absolute: 0 10*3/uL (ref 0.0–0.5)
HGB: 8.5 g/dL — ABNORMAL LOW (ref 13.0–17.1)
MCV: 103.2 fL — ABNORMAL HIGH (ref 79.3–98.0)
MONO#: 0.1 10*3/uL (ref 0.1–0.9)
MONO%: 3.9 % (ref 0.0–14.0)
NEUT#: 0.3 10*3/uL — CL (ref 1.5–6.5)
RBC: 2.52 10*6/uL — ABNORMAL LOW (ref 4.20–5.82)
RDW: 17.8 % — ABNORMAL HIGH (ref 11.0–14.6)
WBC: 1.5 10*3/uL — ABNORMAL LOW (ref 4.0–10.3)

## 2013-04-14 LAB — HOLD TUBE, BLOOD BANK

## 2013-04-14 MED ORDER — ONDANSETRON HCL 8 MG PO TABS: 8.0000 mg | ORAL_TABLET | Freq: Once | ORAL | Status: DC

## 2013-04-14 MED ORDER — AZACITIDINE CHEMO SQ INJECTION: 100.0000 mg/m2 | Freq: Once | INTRAMUSCULAR | Status: DC

## 2013-04-14 NOTE — Progress Notes (Signed)
No blood today per Dr. Gaylyn Rong, chemo. Chemo dates verified with Dr. Gaylyn Rong - not to start until 4/28.

## 2013-04-15 ENCOUNTER — Other Ambulatory Visit: Payer: Self-pay | Admitting: Certified Registered Nurse Anesthetist

## 2013-04-21 ENCOUNTER — Encounter: Payer: Self-pay | Admitting: Oncology

## 2013-04-21 ENCOUNTER — Other Ambulatory Visit (HOSPITAL_BASED_OUTPATIENT_CLINIC_OR_DEPARTMENT_OTHER): Payer: Medicare Other | Admitting: Lab

## 2013-04-21 ENCOUNTER — Ambulatory Visit (HOSPITAL_BASED_OUTPATIENT_CLINIC_OR_DEPARTMENT_OTHER): Payer: Medicare Other

## 2013-04-21 ENCOUNTER — Ambulatory Visit (HOSPITAL_BASED_OUTPATIENT_CLINIC_OR_DEPARTMENT_OTHER): Payer: Medicare Other | Admitting: Oncology

## 2013-04-21 VITALS — BP 132/68 | HR 83 | Temp 97.8°F | Resp 18 | Ht 68.0 in | Wt 191.1 lb

## 2013-04-21 DIAGNOSIS — C9200 Acute myeloblastic leukemia, not having achieved remission: Secondary | ICD-10-CM

## 2013-04-21 DIAGNOSIS — D61818 Other pancytopenia: Secondary | ICD-10-CM

## 2013-04-21 DIAGNOSIS — D46Z Other myelodysplastic syndromes: Secondary | ICD-10-CM

## 2013-04-21 DIAGNOSIS — I10 Essential (primary) hypertension: Secondary | ICD-10-CM

## 2013-04-21 DIAGNOSIS — D469 Myelodysplastic syndrome, unspecified: Secondary | ICD-10-CM

## 2013-04-21 DIAGNOSIS — M810 Age-related osteoporosis without current pathological fracture: Secondary | ICD-10-CM

## 2013-04-21 DIAGNOSIS — Z5111 Encounter for antineoplastic chemotherapy: Secondary | ICD-10-CM

## 2013-04-21 LAB — CBC WITH DIFFERENTIAL/PLATELET
BASO%: 0 % (ref 0.0–2.0)
Basophils Absolute: 0 10*3/uL (ref 0.0–0.1)
Eosinophils Absolute: 0 10*3/uL (ref 0.0–0.5)
HCT: 27.5 % — ABNORMAL LOW (ref 38.4–49.9)
HGB: 8.9 g/dL — ABNORMAL LOW (ref 13.0–17.1)
LYMPH%: 84.5 % — ABNORMAL HIGH (ref 14.0–49.0)
MCHC: 32.4 g/dL (ref 32.0–36.0)
MONO#: 0 10*3/uL — ABNORMAL LOW (ref 0.1–0.9)
NEUT#: 0.3 10*3/uL — CL (ref 1.5–6.5)
NEUT%: 14.5 % — ABNORMAL LOW (ref 39.0–75.0)
Platelets: 99 10*3/uL — ABNORMAL LOW (ref 140–400)
WBC: 1.9 10*3/uL — ABNORMAL LOW (ref 4.0–10.3)
lymph#: 1.6 10*3/uL (ref 0.9–3.3)

## 2013-04-21 MED ORDER — AZACITIDINE CHEMO SQ INJECTION
100.0000 mg/m2 | Freq: Once | INTRAMUSCULAR | Status: AC
  Administered 2013-04-21: 200 mg via SUBCUTANEOUS
  Filled 2013-04-21: qty 8

## 2013-04-21 MED ORDER — ONDANSETRON HCL 8 MG PO TABS
8.0000 mg | ORAL_TABLET | Freq: Once | ORAL | Status: AC
  Administered 2013-04-21: 8 mg via ORAL

## 2013-04-21 NOTE — Patient Instructions (Addendum)
[facility] Discharge Instructions for Patients Receiving Chemotherapy  Today you received the following chemotherapy agents Vidaza  To help prevent nausea and vomiting after your treatment, we encourage you to take your nausea medication as prescribed.   If you develop nausea and vomiting that is not controlled by your nausea medication, call the clinic. If it is after clinic hours your family physician or the after hours number for the clinic or go to the Emergency Department.   BELOW ARE SYMPTOMS THAT SHOULD BE REPORTED IMMEDIATELY:  *FEVER GREATER THAN 100.5 F  *CHILLS WITH OR WITHOUT FEVER  NAUSEA AND VOMITING THAT IS NOT CONTROLLED WITH YOUR NAUSEA MEDICATION  *UNUSUAL SHORTNESS OF BREATH  *UNUSUAL BRUISING OR BLEEDING  TENDERNESS IN MOUTH AND THROAT WITH OR WITHOUT PRESENCE OF ULCERS  *URINARY PROBLEMS  *BOWEL PROBLEMS  UNUSUAL RASH Items with * indicate a potential emergency and should be followed up as soon as possible.  Feel free to call the clinic you have any questions or concerns. The clinic phone number is [phone].   I have been informed and understand all the instructions given to me. I know to contact the clinic, my physician, or go to the Emergency Department if any problems should occur. I do not have any questions at this time, but understand that I may call the clinic during office hours   should I have any questions or need assistance in obtaining follow up care.    __________________________________________  _____________  __________ Signature of Patient or Authorized Representative            Date                   Time    __________________________________________ Nurse's Signature    

## 2013-04-21 NOTE — Progress Notes (Signed)
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   PAST THERAPY: Biopsy only   CURRENT THERAPY:  started on 11/11/2012 Vidaza (hypomethylating chemo) d1-5, d8-9; of every 4 weeks cycle.   INTERVAL HISTORY: Kyle Brewer 79 y.o. male returns for regular follow up with a friend.  He no longer has fatigue; he has been mowing the yard on a regular basis. He is independent of all activities of daily living.  He denied fever, SOB, chest pain, abdominal pain, bleeding symptoms.  The rest of the 14-point review of system was negative.    Past Medical History  Diagnosis Date  . Inguinal hernia     right  . Osteoporosis   . Hypothyroid   . Hyperlipidemia   . Arthritis   . BPH (benign prostatic hyperplasia)   . Wound, open, elbow     Right, followed [facility]  . Hypertension   . CAD (coronary artery disease)     3V CABG 1995, relook cath 2001 with patent grafts. normal stress test 2010.   . Back pain   . CKD (chronic kidney disease), stage I   . Bursitis   . Pancytopenia   . Kidney stone   . 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 MDS (myelodysplastic syndrome), high grade     Past Surgical History  Procedure Laterality Date  . Appendectomy  1968  . Coronary artery bypass graft  1995  . Total knee arthroplasty  2007    right  . Elbow arthroscopy  2012    right  . Transurethral resection of prostate      Dr. Isabel Caprice  . Hernia repair  11/07/11    lap bilateral inguinal    Current Outpatient Prescriptions  Medication Sig Dispense Refill  . alendronate (FOSAMAX) 70 MG tablet Take 70 mg by mouth every 7 (seven) days. Take with a full glass of water on an empty stomach.       Marland Kitchen amLODipine (NORVASC) 5 MG tablet Take 5 mg by mouth daily before breakfast.      . Ascorbic Acid (VITAMIN C PO) Take  500 mg by mouth daily.       Marland Kitchen aspirin 81 MG tablet Take 81 mg by mouth daily.        . Calcium Carbonate-Vitamin D (CALCIUM 600 + D PO) Take 1 tablet by mouth 2 (two) times daily.      . Cyanocobalamin (VITAMIN B12 PO) Take 1,000 mcg by mouth daily.       Marland Kitchen dutasteride (AVODART) 0.5 MG capsule Take 0.5 mg by mouth daily.        Marland Kitchen levothyroxine (SYNTHROID, LEVOTHROID) 50 MCG tablet Take 50 mcg by mouth every evening.       . lidocaine-prilocaine (EMLA) cream Apply topically as needed.  30 g  2  . lisinopril (PRINIVIL,ZESTRIL) 10 MG tablet Take 10 mg by mouth every evening.       Marland Kitchen LORazepam (ATIVAN) 0.5 MG tablet Take 0.5 mg by mouth at bedtime as needed for anxiety (sleep).       . meloxicam (MOBIC) 7.5 MG tablet Take 7.5 mg by mouth 2 (two) times daily.       . metoprolol succinate (TOPROL-XL) 100 MG 24 hr tablet Take 100 mg by mouth daily before breakfast. Take with or immediately  following a meal.      . Multiple Vitamin (MULTIVITAMIN WITH MINERALS) TABS Take 1 tablet by mouth daily.      . simvastatin (ZOCOR) 20 MG tablet Take 20 mg by mouth at bedtime.        Marland Kitchen terazosin (HYTRIN) 10 MG capsule Take 10 mg by mouth at bedtime.         No current facility-administered medications for this visit.    ALLERGIES:  has No Known Allergies.  REVIEW OF SYSTEMS:  The rest of the 14-point review of system was negative.   Filed Vitals:   04/21/13 1313  BP: 132/68  Pulse: 83  Temp: 97.8 F (36.6 C)  Resp: 18   Wt Readings from Last 3 Encounters:  04/21/13 191 lb 1.6 oz (86.682 kg)  03/27/13 193 lb (87.544 kg)  03/24/13 194 lb 8 oz (88.225 kg)   ECOG Performance status: 0-1  PHYSICAL EXAMINATION:   General:  well-nourished man, in no acute distress.  Eyes:  no scleral icterus.  ENT:  There were no oropharyngeal lesions.  Neck was without thyromegaly.  Lymphatics:  Negative cervical, supraclavicular or axillary adenopathy.  Respiratory: lungs were clear bilaterally without wheezing or  crackles.  Cardiovascular:  Regular rate and rhythm, S1/S2, without rub or gallop. There was a II/VI systolic murmur best heard in the left upper sternal border. There was 1+ bilateral pedal edema.  GI:  abdomen was soft, flat, nontender, nondistended, without organomegaly.  Muscoloskeletal:  no spinal tenderness of palpation of vertebral spine.  Skin exam was without petichae. There was some ecchymosis on the skin over the abdomen where he had Vidaza injection last week.  There was pain, erythema to palpation.  Neuro exam was nonfocal.  Patient was able to get on and off exam table without assistance.  Gait was normal.  Patient was alerted and oriented.  Attention was good.   Language was appropriate.  Mood was normal without depression.  Speech was not pressured.  Thought content was not tangential.     LABORATORY/RADIOLOGY DATA:  Lab Results  Component Value Date   WBC 1.9* 04/21/2013   HGB 8.9* 04/21/2013   HCT 27.5* 04/21/2013   PLT 99* 04/21/2013   GLUCOSE 103* 02/03/2013   ALKPHOS 68 02/03/2013   ALT 15 02/03/2013   AST 11 02/03/2013   NA 142 02/03/2013   K 4.4 02/03/2013   CL 110* 02/03/2013   CREATININE 1.8* 02/03/2013   BUN 36.9* 02/03/2013   CO2 24 02/03/2013   INR 1.07 02/20/2013     ASSESSMENT AND PLAN:   1. Hypertension: He is on amlodipine, lisinopril, metoprolol.  2. Hyperlipidemia: He is on simvastatin.  3. CAD: He is on ASA, simvastatin, lisinopril, and metoprolol.  4. Hypothyroidism: He is on Synthroid.  5. BPH: He is on dutasteride and terazosin.  6. Osteoporosis: He is on Fosamax.  7. 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 Michae Kava Powell's recommendation.  Plan is to repeat a bone marrow biopsy after 2 cycles. He tolerated chemo relatively well with grade 2 fatigue.    8.  Pancytopenia:  Due to MDS/AML.  There is no active bleeding.  There is no indication for transfusion today.  Goal of  transfusion Hgb <7 or 8; Plt <10K or active bleeding.  Will check his CBC every week.   9 . Code status: FULL CODE.   The length of time of the face-to-face  encounter was 15 minutes. More than 50% of time was spent counseling and coordination of care.

## 2013-04-21 NOTE — Progress Notes (Signed)
Ok to treat per [employer]

## 2013-04-22 ENCOUNTER — Other Ambulatory Visit: Payer: Self-pay | Admitting: Oncology

## 2013-04-22 ENCOUNTER — Telehealth: Payer: Self-pay | Admitting: Oncology

## 2013-04-22 ENCOUNTER — Ambulatory Visit (HOSPITAL_BASED_OUTPATIENT_CLINIC_OR_DEPARTMENT_OTHER): Payer: Medicare Other

## 2013-04-22 VITALS — BP 112/62 | HR 70 | Temp 98.0°F

## 2013-04-22 DIAGNOSIS — C9200 Acute myeloblastic leukemia, not having achieved remission: Secondary | ICD-10-CM

## 2013-04-22 DIAGNOSIS — Z5111 Encounter for antineoplastic chemotherapy: Secondary | ICD-10-CM

## 2013-04-22 DIAGNOSIS — D46Z Other myelodysplastic syndromes: Secondary | ICD-10-CM

## 2013-04-22 MED ORDER — ONDANSETRON HCL 8 MG PO TABS
8.0000 mg | ORAL_TABLET | Freq: Once | ORAL | Status: AC
  Administered 2013-04-22: 8 mg via ORAL

## 2013-04-22 MED ORDER — AZACITIDINE CHEMO SQ INJECTION
100.0000 mg/m2 | Freq: Once | INTRAMUSCULAR | Status: AC
  Administered 2013-04-22: 200 mg via SUBCUTANEOUS
  Filled 2013-04-22: qty 8

## 2013-04-22 NOTE — Patient Instructions (Signed)
[facility] Discharge Instructions for Patients Receiving Chemotherapy  Today you received the following chemotherapy agents Vidaza.  To help prevent nausea and vomiting after your treatment, we encourage you to take your nausea medication as prescribed.   If you develop nausea and vomiting that is not controlled by your nausea medication, call the clinic. If it is after clinic hours your family physician or the after hours number for the clinic or go to the Emergency Department.   BELOW ARE SYMPTOMS THAT SHOULD BE REPORTED IMMEDIATELY:  *FEVER GREATER THAN 100.5 F  *CHILLS WITH OR WITHOUT FEVER  NAUSEA AND VOMITING THAT IS NOT CONTROLLED WITH YOUR NAUSEA MEDICATION  *UNUSUAL SHORTNESS OF BREATH  *UNUSUAL BRUISING OR BLEEDING  TENDERNESS IN MOUTH AND THROAT WITH OR WITHOUT PRESENCE OF ULCERS  *URINARY PROBLEMS  *BOWEL PROBLEMS  UNUSUAL RASH Items with * indicate a potential emergency and should be followed up as soon as possible.  One of the nurses will contact you 24 hours after your treatment. Please let the nurse know about any problems that you may have experienced. Feel free to call the clinic you have any questions or concerns. The clinic phone number is [phone].

## 2013-04-22 NOTE — Telephone Encounter (Signed)
Add to previous note.... [facility] will call w/bx appt.

## 2013-04-22 NOTE — Telephone Encounter (Signed)
Added appts for end of May. Per KC due to holiday 5/26 and HH off day 5/27. Pt will have lb/HH/blood 5/28 and also start 7 day tx 5/28. S/w pt he is aware and will get new schedule when he comes in tomorrow.

## 2013-04-23 ENCOUNTER — Ambulatory Visit (HOSPITAL_BASED_OUTPATIENT_CLINIC_OR_DEPARTMENT_OTHER): Payer: Medicare Other

## 2013-04-23 VITALS — BP 98/60 | HR 61 | Temp 98.4°F

## 2013-04-23 DIAGNOSIS — D46Z Other myelodysplastic syndromes: Secondary | ICD-10-CM

## 2013-04-23 DIAGNOSIS — C9200 Acute myeloblastic leukemia, not having achieved remission: Secondary | ICD-10-CM

## 2013-04-23 DIAGNOSIS — Z5111 Encounter for antineoplastic chemotherapy: Secondary | ICD-10-CM

## 2013-04-23 MED ORDER — AZACITIDINE CHEMO SQ INJECTION
100.0000 mg/m2 | Freq: Once | INTRAMUSCULAR | Status: AC
  Administered 2013-04-23: 200 mg via SUBCUTANEOUS
  Filled 2013-04-23: qty 8

## 2013-04-23 MED ORDER — ONDANSETRON HCL 8 MG PO TABS
8.0000 mg | ORAL_TABLET | Freq: Once | ORAL | Status: AC
  Administered 2013-04-23: 8 mg via ORAL

## 2013-04-23 NOTE — Patient Instructions (Addendum)
[facility] Discharge Instructions for Patients Receiving Chemotherapy  Today you received the following chemotherapy agents Vidaza  To help prevent nausea and vomiting after your treatment, we encourage you to take your nausea medication as prescribed.   If you develop nausea and vomiting that is not controlled by your nausea medication, call the clinic. If it is after clinic hours your family physician or the after hours number for the clinic or go to the Emergency Department.   BELOW ARE SYMPTOMS THAT SHOULD BE REPORTED IMMEDIATELY:  *FEVER GREATER THAN 100.5 F  *CHILLS WITH OR WITHOUT FEVER  NAUSEA AND VOMITING THAT IS NOT CONTROLLED WITH YOUR NAUSEA MEDICATION  *UNUSUAL SHORTNESS OF BREATH  *UNUSUAL BRUISING OR BLEEDING  TENDERNESS IN MOUTH AND THROAT WITH OR WITHOUT PRESENCE OF ULCERS  *URINARY PROBLEMS  *BOWEL PROBLEMS  UNUSUAL RASH Items with * indicate a potential emergency and should be followed up as soon as possible.  Feel free to call the clinic you have any questions or concerns. The clinic phone number is [phone].   I have been informed and understand all the instructions given to me. I know to contact the clinic, my physician, or go to the Emergency Department if any problems should occur. I do not have any questions at this time, but understand that I may call the clinic during office hours   should I have any questions or need assistance in obtaining follow up care.    __________________________________________  _____________  __________ Signature of Patient or Authorized Representative            Date                   Time    __________________________________________ Nurse's Signature    

## 2013-04-24 ENCOUNTER — Encounter (HOSPITAL_COMMUNITY)
Admission: RE | Admit: 2013-04-24 | Discharge: 2013-04-24 | Disposition: A | Discharge status: Home / Self Care | Payer: Medicare Other | Source: Ambulatory Visit | Attending: Oncology | Admitting: Oncology

## 2013-04-24 ENCOUNTER — Ambulatory Visit (HOSPITAL_BASED_OUTPATIENT_CLINIC_OR_DEPARTMENT_OTHER): Payer: Medicare Other

## 2013-04-24 VITALS — BP 107/59 | HR 69 | Temp 98.6°F | Resp 18

## 2013-04-24 DIAGNOSIS — D649 Anemia, unspecified: Secondary | ICD-10-CM | POA: Insufficient documentation

## 2013-04-24 DIAGNOSIS — Z5111 Encounter for antineoplastic chemotherapy: Secondary | ICD-10-CM

## 2013-04-24 DIAGNOSIS — D46Z Other myelodysplastic syndromes: Secondary | ICD-10-CM

## 2013-04-24 DIAGNOSIS — C9200 Acute myeloblastic leukemia, not having achieved remission: Secondary | ICD-10-CM

## 2013-04-24 MED ORDER — ONDANSETRON HCL 8 MG PO TABS
8.0000 mg | ORAL_TABLET | Freq: Once | ORAL | Status: AC
  Administered 2013-04-24: 8 mg via ORAL

## 2013-04-24 MED ORDER — AZACITIDINE CHEMO SQ INJECTION
100.0000 mg/m2 | Freq: Once | INTRAMUSCULAR | Status: AC
  Administered 2013-04-24: 200 mg via SUBCUTANEOUS
  Filled 2013-04-24: qty 8

## 2013-04-24 NOTE — Patient Instructions (Addendum)
[facility] Discharge Instructions for Patients Receiving Chemotherapy  Today you received the following chemotherapy agents Vidaza  To help prevent nausea and vomiting after your treatment, we encourage you to take your nausea medication. If you develop nausea and vomiting that is not controlled by your nausea medication, call the clinic. If it is after clinic hours your family physician or the after hours number for the clinic or go to the Emergency Department.   BELOW ARE SYMPTOMS THAT SHOULD BE REPORTED IMMEDIATELY:  *FEVER GREATER THAN 100.5 F  *CHILLS WITH OR WITHOUT FEVER  NAUSEA AND VOMITING THAT IS NOT CONTROLLED WITH YOUR NAUSEA MEDICATION  *UNUSUAL SHORTNESS OF BREATH  *UNUSUAL BRUISING OR BLEEDING  TENDERNESS IN MOUTH AND THROAT WITH OR WITHOUT PRESENCE OF ULCERS  *URINARY PROBLEMS  *BOWEL PROBLEMS  UNUSUAL RASH Items with * indicate a potential emergency and should be followed up as soon as possible.  One of the nurses will contact you 24 hours after your treatment. Please let the nurse know about any problems that you may have experienced. Feel free to call the clinic you have any questions or concerns. The clinic phone number is [phone].   I have been informed and understand all the instructions given to me. I know to contact the clinic, my physician, or go to the Emergency Department if any problems should occur. I do not have any questions at this time, but understand that I may call the clinic during office hours   should I have any questions or need assistance in obtaining follow up care.    __________________________________________  _____________  __________ Signature of Patient or Authorized Representative            Date                   Time    __________________________________________ Nurse's Signature    

## 2013-04-25 ENCOUNTER — Other Ambulatory Visit: Payer: Self-pay | Admitting: *Deleted

## 2013-04-25 ENCOUNTER — Encounter: Payer: Self-pay | Admitting: *Deleted

## 2013-04-25 ENCOUNTER — Ambulatory Visit (HOSPITAL_BASED_OUTPATIENT_CLINIC_OR_DEPARTMENT_OTHER): Payer: Medicare Other

## 2013-04-25 VITALS — BP 116/62 | HR 63 | Temp 98.4°F | Resp 20

## 2013-04-25 DIAGNOSIS — D46Z Other myelodysplastic syndromes: Secondary | ICD-10-CM

## 2013-04-25 DIAGNOSIS — C9200 Acute myeloblastic leukemia, not having achieved remission: Secondary | ICD-10-CM

## 2013-04-25 MED ORDER — AZACITIDINE CHEMO SQ INJECTION
100.0000 mg/m2 | Freq: Once | INTRAMUSCULAR | Status: AC
  Administered 2013-04-25: 200 mg via SUBCUTANEOUS
  Filled 2013-04-25: qty 8

## 2013-04-25 MED ORDER — AMLODIPINE BESYLATE 5 MG PO TABS: 5.0000 mg | ORAL_TABLET | Freq: Every day | ORAL | Status: DC

## 2013-04-25 MED ORDER — METOPROLOL SUCCINATE ER 100 MG PO TB24: 100.0000 mg | ORAL_TABLET | Freq: Every day | ORAL | Status: AC

## 2013-04-25 MED ORDER — ONDANSETRON HCL 8 MG PO TABS
8.0000 mg | ORAL_TABLET | Freq: Once | ORAL | Status: AC
  Administered 2013-04-25: 8 mg via ORAL

## 2013-04-25 NOTE — Patient Instructions (Signed)
[facility] Discharge [facility] for Patients Receiving Chemotherapy  Today you received the following chemotherapy agents :  Vidaza.  To help prevent nausea and vomiting after your treatment, we encourage you to take your nausea medication as instructed by your physician.    If you develop nausea and vomiting that is not controlled by your nausea medication, call the clinic. If it is after clinic hours your family physician or the after hours number for the clinic or go to the Emergency Department.   BELOW ARE SYMPTOMS THAT SHOULD BE REPORTED IMMEDIATELY:  *FEVER GREATER THAN 100.5 F  *CHILLS WITH OR WITHOUT FEVER  NAUSEA AND VOMITING THAT IS NOT CONTROLLED WITH YOUR NAUSEA MEDICATION  *UNUSUAL SHORTNESS OF BREATH  *UNUSUAL BRUISING OR BLEEDING  TENDERNESS IN MOUTH AND THROAT WITH OR WITHOUT PRESENCE OF ULCERS  *URINARY PROBLEMS  *BOWEL PROBLEMS  UNUSUAL RASH Items with * indicate a potential emergency and should be followed up as soon as possible.  One of the nurses will contact you 24 hours after your treatment. Please let the nurse know about any problems that you may have experienced. Feel free to call the clinic you have any questions or concerns. The clinic phone number is [phone].   I have been informed and understand all the instructions given to me. I know to contact the clinic, my physician, or go to the Emergency Department if any problems should occur. I do not have any questions at this time, but understand that I may call the clinic during office hours   should I have any questions or need assistance in obtaining follow up care.    __________________________________________  _____________  __________ Signature of Patient or Authorized Representative            Date                   Time    __________________________________________ Nurse's Signature    

## 2013-04-28 ENCOUNTER — Other Ambulatory Visit: Payer: Self-pay | Admitting: Oncology

## 2013-04-28 ENCOUNTER — Ambulatory Visit (HOSPITAL_BASED_OUTPATIENT_CLINIC_OR_DEPARTMENT_OTHER): Payer: Medicare Other

## 2013-04-28 ENCOUNTER — Other Ambulatory Visit (HOSPITAL_BASED_OUTPATIENT_CLINIC_OR_DEPARTMENT_OTHER): Payer: Medicare Other | Admitting: Lab

## 2013-04-28 VITALS — BP 131/74 | HR 77 | Temp 98.2°F

## 2013-04-28 DIAGNOSIS — D469 Myelodysplastic syndrome, unspecified: Secondary | ICD-10-CM

## 2013-04-28 DIAGNOSIS — C9200 Acute myeloblastic leukemia, not having achieved remission: Secondary | ICD-10-CM

## 2013-04-28 DIAGNOSIS — Z5111 Encounter for antineoplastic chemotherapy: Secondary | ICD-10-CM

## 2013-04-28 DIAGNOSIS — D46Z Other myelodysplastic syndromes: Secondary | ICD-10-CM

## 2013-04-28 LAB — CBC WITH DIFFERENTIAL/PLATELET
Basophils Absolute: 0 10*3/uL (ref 0.0–0.1)
EOS%: 1.2 % (ref 0.0–7.0)
HGB: 9.3 g/dL — ABNORMAL LOW (ref 13.0–17.1)
MCH: 32.5 pg (ref 27.2–33.4)
MCV: 102.4 fL — ABNORMAL HIGH (ref 79.3–98.0)
MONO%: 1.8 % (ref 0.0–14.0)
RDW: 15.4 % — ABNORMAL HIGH (ref 11.0–14.6)

## 2013-04-28 MED ORDER — AZACITIDINE CHEMO SQ INJECTION
100.0000 mg/m2 | Freq: Once | INTRAMUSCULAR | Status: AC
  Administered 2013-04-28: 200 mg via SUBCUTANEOUS
  Filled 2013-04-28: qty 8

## 2013-04-28 MED ORDER — ONDANSETRON HCL 8 MG PO TABS
8.0000 mg | ORAL_TABLET | Freq: Once | ORAL | Status: AC
  Administered 2013-04-28: 8 mg via ORAL

## 2013-04-28 NOTE — Progress Notes (Signed)
Ok to treat with ANC 0.3 per Clenton Pare, PA

## 2013-04-29 ENCOUNTER — Other Ambulatory Visit: Payer: Self-pay | Admitting: Oncology

## 2013-04-29 ENCOUNTER — Ambulatory Visit (HOSPITAL_BASED_OUTPATIENT_CLINIC_OR_DEPARTMENT_OTHER): Payer: Medicare Other

## 2013-04-29 VITALS — BP 105/63 | HR 64 | Temp 97.9°F

## 2013-04-29 DIAGNOSIS — C9200 Acute myeloblastic leukemia, not having achieved remission: Secondary | ICD-10-CM

## 2013-04-29 DIAGNOSIS — Z5111 Encounter for antineoplastic chemotherapy: Secondary | ICD-10-CM

## 2013-04-29 DIAGNOSIS — D46Z Other myelodysplastic syndromes: Secondary | ICD-10-CM

## 2013-04-29 MED ORDER — ONDANSETRON HCL 8 MG PO TABS
8.0000 mg | ORAL_TABLET | Freq: Once | ORAL | Status: AC
  Administered 2013-04-29: 8 mg via ORAL

## 2013-04-29 MED ORDER — AZACITIDINE CHEMO SQ INJECTION
100.0000 mg/m2 | Freq: Once | INTRAMUSCULAR | Status: AC
  Administered 2013-04-29: 200 mg via SUBCUTANEOUS
  Filled 2013-04-29: qty 8

## 2013-04-29 NOTE — Progress Notes (Signed)
0945-OK to treat despite labs per Dr. Gaylyn Rong.

## 2013-04-29 NOTE — Patient Instructions (Addendum)
[facility] Discharge Instructions for Patients Receiving Chemotherapy  Today you received the following chemotherapy agents:  Vidaza  To help prevent nausea and vomiting after your treatment, we encourage you to take your nausea medication as ordered per MD.   If you develop nausea and vomiting that is not controlled by your nausea medication, call the clinic. If it is after clinic hours your family physician or the after hours number for the clinic or go to the Emergency Department.   BELOW ARE SYMPTOMS THAT SHOULD BE REPORTED IMMEDIATELY:  *FEVER GREATER THAN 100.5 F  *CHILLS WITH OR WITHOUT FEVER  NAUSEA AND VOMITING THAT IS NOT CONTROLLED WITH YOUR NAUSEA MEDICATION  *UNUSUAL SHORTNESS OF BREATH  *UNUSUAL BRUISING OR BLEEDING  TENDERNESS IN MOUTH AND THROAT WITH OR WITHOUT PRESENCE OF ULCERS  *URINARY PROBLEMS  *BOWEL PROBLEMS  UNUSUAL RASH Items with * indicate a potential emergency and should be followed up as soon as possible.   Please let the nurse know about any problems that you may have experienced. Feel free to call the clinic you have any questions or concerns. The clinic phone number is [phone].   I have been informed and understand all the instructions given to me. I know to contact the clinic, my physician, or go to the Emergency Department if any problems should occur. I do not have any questions at this time, but understand that I may call the clinic during office hours   should I have any questions or need assistance in obtaining follow up care.    __________________________________________  _____________  __________ Signature of Patient or Authorized Representative            Date                   Time    __________________________________________ Nurse's Signature

## 2013-05-05 ENCOUNTER — Ambulatory Visit: Payer: Medicare Other

## 2013-05-05 ENCOUNTER — Other Ambulatory Visit (HOSPITAL_BASED_OUTPATIENT_CLINIC_OR_DEPARTMENT_OTHER): Payer: Medicare Other | Admitting: Lab

## 2013-05-05 ENCOUNTER — Telehealth: Payer: Self-pay | Admitting: Dietician

## 2013-05-05 DIAGNOSIS — C9200 Acute myeloblastic leukemia, not having achieved remission: Secondary | ICD-10-CM

## 2013-05-05 DIAGNOSIS — D469 Myelodysplastic syndrome, unspecified: Secondary | ICD-10-CM

## 2013-05-05 LAB — CBC WITH DIFFERENTIAL/PLATELET
Basophils Absolute: 0 10*3/uL (ref 0.0–0.1)
Eosinophils Absolute: 0.1 10*3/uL (ref 0.0–0.5)
HCT: 26.5 % — ABNORMAL LOW (ref 38.4–49.9)
LYMPH%: 77.4 % — ABNORMAL HIGH (ref 14.0–49.0)
MCHC: 32.1 g/dL (ref 32.0–36.0)
MONO#: 0 10*3/uL — ABNORMAL LOW (ref 0.1–0.9)
NEUT#: 0.3 10*3/uL — CL (ref 1.5–6.5)
NEUT%: 17 % — ABNORMAL LOW (ref 39.0–75.0)
Platelets: 94 10*3/uL — ABNORMAL LOW (ref 140–400)
WBC: 1.6 10*3/uL — ABNORMAL LOW (ref 4.0–10.3)

## 2013-05-05 NOTE — Progress Notes (Signed)
Per Clenton Pare, hold blood transfusion today with Hgb 8.5.  Pt informed in lobby and verbalized understanding; f/u as scheduled.

## 2013-05-08 ENCOUNTER — Encounter (HOSPITAL_COMMUNITY): Payer: Self-pay | Admitting: Pharmacy Technician

## 2013-05-12 ENCOUNTER — Other Ambulatory Visit: Payer: Self-pay | Admitting: Radiology

## 2013-05-12 ENCOUNTER — Other Ambulatory Visit: Payer: Self-pay | Admitting: *Deleted

## 2013-05-12 ENCOUNTER — Other Ambulatory Visit (HOSPITAL_BASED_OUTPATIENT_CLINIC_OR_DEPARTMENT_OTHER): Payer: Medicare Other | Admitting: Lab

## 2013-05-12 ENCOUNTER — Ambulatory Visit: Payer: Medicare Other

## 2013-05-12 DIAGNOSIS — D469 Myelodysplastic syndrome, unspecified: Secondary | ICD-10-CM

## 2013-05-12 DIAGNOSIS — C9200 Acute myeloblastic leukemia, not having achieved remission: Secondary | ICD-10-CM

## 2013-05-12 LAB — CBC WITH DIFFERENTIAL/PLATELET
Basophils Absolute: 0 10*3/uL (ref 0.0–0.1)
EOS%: 3.6 % (ref 0.0–7.0)
HCT: 27.6 % — ABNORMAL LOW (ref 38.4–49.9)
HGB: 9.1 g/dL — ABNORMAL LOW (ref 13.0–17.1)
MCH: 33.3 pg (ref 27.2–33.4)
MCV: 101.1 fL — ABNORMAL HIGH (ref 79.3–98.0)
MONO%: 2.7 % (ref 0.0–14.0)
NEUT%: 12 % — ABNORMAL LOW (ref 39.0–75.0)
Platelets: 117 10*3/uL — ABNORMAL LOW (ref 140–400)

## 2013-05-12 NOTE — Progress Notes (Signed)
transfusion cancelled

## 2013-05-14 ENCOUNTER — Ambulatory Visit (HOSPITAL_COMMUNITY): Payer: Medicare Other

## 2013-05-15 ENCOUNTER — Ambulatory Visit (HOSPITAL_COMMUNITY)
Admission: RE | Admit: 2013-05-15 | Discharge: 2013-05-15 | Disposition: A | Discharge status: Home / Self Care | Payer: Medicare Other | Source: Ambulatory Visit | Attending: Oncology | Admitting: Oncology

## 2013-05-15 ENCOUNTER — Encounter (HOSPITAL_COMMUNITY): Payer: Self-pay

## 2013-05-15 DIAGNOSIS — E119 Type 2 diabetes mellitus without complications: Secondary | ICD-10-CM | POA: Insufficient documentation

## 2013-05-15 DIAGNOSIS — E785 Hyperlipidemia, unspecified: Secondary | ICD-10-CM | POA: Insufficient documentation

## 2013-05-15 DIAGNOSIS — Z96659 Presence of unspecified artificial knee joint: Secondary | ICD-10-CM | POA: Insufficient documentation

## 2013-05-15 DIAGNOSIS — Z6829 Body mass index (BMI) 29.0-29.9, adult: Secondary | ICD-10-CM | POA: Insufficient documentation

## 2013-05-15 DIAGNOSIS — I10 Essential (primary) hypertension: Secondary | ICD-10-CM | POA: Insufficient documentation

## 2013-05-15 DIAGNOSIS — M810 Age-related osteoporosis without current pathological fracture: Secondary | ICD-10-CM | POA: Insufficient documentation

## 2013-05-15 DIAGNOSIS — Z951 Presence of aortocoronary bypass graft: Secondary | ICD-10-CM | POA: Insufficient documentation

## 2013-05-15 DIAGNOSIS — Z7982 Long term (current) use of aspirin: Secondary | ICD-10-CM | POA: Insufficient documentation

## 2013-05-15 DIAGNOSIS — Z9089 Acquired absence of other organs: Secondary | ICD-10-CM | POA: Insufficient documentation

## 2013-05-15 DIAGNOSIS — E039 Hypothyroidism, unspecified: Secondary | ICD-10-CM | POA: Insufficient documentation

## 2013-05-15 DIAGNOSIS — C9200 Acute myeloblastic leukemia, not having achieved remission: Secondary | ICD-10-CM

## 2013-05-15 DIAGNOSIS — Z79899 Other long term (current) drug therapy: Secondary | ICD-10-CM | POA: Insufficient documentation

## 2013-05-15 DIAGNOSIS — D61818 Other pancytopenia: Secondary | ICD-10-CM | POA: Insufficient documentation

## 2013-05-15 LAB — CBC
HCT: 27.6 % — ABNORMAL LOW (ref 39.0–52.0)
Hemoglobin: 9.1 g/dL — ABNORMAL LOW (ref 13.0–17.0)
RBC: 2.79 MIL/uL — ABNORMAL LOW (ref 4.22–5.81)
WBC: 1.3 10*3/uL — CL (ref 4.0–10.5)

## 2013-05-15 LAB — APTT: aPTT: 33 seconds (ref 24–37)

## 2013-05-15 LAB — PROTIME-INR
INR: 1.04 (ref 0.00–1.49)
Prothrombin Time: 13.5 seconds (ref 11.6–15.2)

## 2013-05-15 LAB — BONE MARROW EXAM: Bone Marrow Exam: 317

## 2013-05-15 MED ORDER — FENTANYL CITRATE 0.05 MG/ML IJ SOLN
INTRAMUSCULAR | Status: AC | PRN
  Administered 2013-05-15: 100 ug via INTRAVENOUS

## 2013-05-15 MED ORDER — FENTANYL CITRATE 0.05 MG/ML IJ SOLN
INTRAMUSCULAR | Status: AC
  Filled 2013-05-15: qty 6

## 2013-05-15 MED ORDER — MIDAZOLAM HCL 2 MG/2ML IJ SOLN
INTRAMUSCULAR | Status: AC
  Filled 2013-05-15: qty 6

## 2013-05-15 MED ORDER — MIDAZOLAM HCL 2 MG/2ML IJ SOLN
INTRAMUSCULAR | Status: AC | PRN
  Administered 2013-05-15: 1 mg via INTRAVENOUS

## 2013-05-15 MED ORDER — SODIUM CHLORIDE 0.9 % IV SOLN
INTRAVENOUS | Status: DC
  Administered 2013-05-15: 08:00:00 via INTRAVENOUS

## 2013-05-15 MED ORDER — HYDROCODONE-ACETAMINOPHEN 5-325 MG PO TABS
1.0000 | ORAL_TABLET | ORAL | Status: DC | PRN
  Filled 2013-05-15: qty 2

## 2013-05-15 NOTE — Progress Notes (Signed)
CRITICAL VALUE ALERT  Critical value received:  WBC 1.3 Date of notification:  05/15/13  Time of notification:  0745  Critical value read back: yes  Nurse who received alert:  Harrietta Guardian RN  MD notified (1st page):  Awaiting MD/PA to evaluate  Time of first page:   MD notified (2nd page):  Time of second page:  Responding MD:   Time MD responded:

## 2013-05-15 NOTE — H&P (Signed)
Agree with PA note.    Signed,  Timi Reeser K. Nicholle Falzon, MD Vascular & Interventional Radiologist [facility]  

## 2013-05-15 NOTE — H&P (Signed)
Chief Complaint: "I'm here for a bone marrow biopsy" Referring Physician:Ha HPI: Kyle Brewer is an 79 y.o. male with AML and pancytopenia. He is here today for repeat bone marrow biopsy. His last was 2/14. PMHx and meds reviewed. No recent illness, fevers.  Past Medical History:  Past Medical History  Diagnosis Date  . Inguinal hernia     right  . Osteoporosis   . Hypothyroid   . Hyperlipidemia   . Arthritis   . BPH (benign prostatic hyperplasia)   . Wound, open, elbow     Right, followed [facility]  . Hypertension   . CAD (coronary artery disease)     3V CABG 1995, relook cath 2001 with patent grafts. normal stress test 2010.   . Back pain   . CKD (chronic kidney disease), stage I   . Bursitis   . Pancytopenia   . Kidney stone   . 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 MDS (myelodysplastic syndrome), high grade     Past Surgical History:  Past Surgical History  Procedure Laterality Date  . Appendectomy  1968  . Coronary artery bypass graft  1995  . Total knee arthroplasty  2007    right  . Elbow arthroscopy  2012    right  . Transurethral resection of prostate      Dr. Isabel Caprice  . Hernia repair  11/07/11    lap bilateral inguinal    Family History:  Family History  Problem Relation Age of Onset  . Cancer Father     lung  . Heart failure Mother   . Cancer Brother     lung     Social History:  reports that he quit smoking about 49 years ago. He has never used smokeless tobacco. He reports that he does not drink alcohol or use illicit drugs.  Allergies: No Known Allergies  Medications: alendronate (FOSAMAX) 70 MG tablet (Taking) Sig - Route: Take 70 mg by mouth every Monday. Take with a full glass of water on an empty stomach. - Oral Class: Historical Med Number of times this order has been changed since signing: 3 Order [address] amLODipine (NORVASC) 5 MG tablet (Taking) 90 tablet 3 04/25/2013 04/25/2014 Sig - Route: Take 1 tablet (5 mg total) by mouth  daily before breakfast. - Oral Number of times this order has been changed since signing: 1 Order [address] aspirin 81 MG tablet (Taking) Sig - Route: Take 81 mg by mouth daily. - Oral Class: Historical Med Number of times this order has been changed since signing: 1 Order [address] Calcium Carbonate-Vitamin D (CALCIUM 600 + D PO) (Taking) Sig - Route: Take 1 tablet by mouth 2 (two) times daily. - Oral Class: Historical Med Number of times this order has been changed since signing: 1 Order [address] dutasteride (AVODART) 0.5 MG capsule (Taking) Sig - Route: Take 0.5 mg by mouth every morning. - Oral Class: Historical Med Number of times this order has been changed since signing: 3 Order [address] levothyroxine (SYNTHROID, LEVOTHROID) 50 MCG tablet (Taking) Sig - Route: Take 50 mcg by mouth every evening. - Oral Class: Historical Med Number of times this order has been changed since signing: 2 Order [address] lidocaine-prilocaine (EMLA) cream (Taking) 30 g 2 11/13/2012 Sig - Route: Apply topically as needed. - Topical Class: Phone In Number of times this order has been changed since signing: 1 Order [address] lisinopril (PRINIVIL,ZESTRIL) 10 MG tablet (Taking) Sig - Route: Take 10 mg  by mouth every evening. - Oral Class: Historical Med Number of times this order has been changed since signing: 2 Order [address] LORazepam (ATIVAN) 0.5 MG tablet (Taking) Sig - Route: Take 0.5 mg by mouth at bedtime as needed for anxiety (sleep). - Oral Class: Historical Med Number of times this order has been changed since signing: 1 Order [address] meloxicam (MOBIC) 7.5 MG tablet (Taking) Sig - Route: Take 7.5 mg by mouth 2 (two) times daily. - Oral Class: Historical Med Number of times this order has been changed since signing: 3 Order [address] metoprolol succinate (TOPROL-XL) 100 MG 24 hr tablet (Taking) 90 tablet 3 04/25/2013 Sig - Route: Take 1 tablet (100 mg total) by mouth daily before breakfast. Take with or  immediately following a meal. - Oral Number of times this order has been changed since signing: 1 Order [address] Multiple Vitamin (MULTIVITAMIN WITH MINERALS) TABS (Taking) Sig - Route: Take 1 tablet by mouth daily. - Oral Class: Historical Med simvastatin (ZOCOR) 20 MG tablet (Taking) Sig - Route: Take 20 mg by mouth at bedtime. - Oral Class: Historical Med   Please HPI for pertinent positives, otherwise complete 10 system ROS negative.  Physical Exam: BP 123/67  Pulse 63  Temp(Src) 97.6 F (36.4 C) (Tympanic)  Resp 20  Ht 5\' 8"  (1.727 m)  Wt 191 lb (86.637 kg)  BMI 29.05 kg/m2  SpO2 98% Body mass index is 29.05 kg/(m^2).   General Appearance:  Alert, cooperative, no distress, appears stated age  Head:  Normocephalic, without obvious abnormality, atraumatic  ENT: Unremarkable  Neck: Supple, symmetrical, trachea midline  Lungs:   Clear to auscultation bilaterally, no w/r/r, respirations unlabored without use of accessory muscles.  Chest Wall:  No tenderness or deformity  Heart:  Regular rate and rhythm, S1, S2 normal, no murmur, rub or gallop.  Abdomen:   Soft, non-tender, non distended.  Extremities: Extremities normal, atraumatic, no cyanosis or edema  Pulses: 2+ and symmetric  Neurologic: Normal affect, no gross deficits.   Results for orders placed during the hospital encounter of 05/15/13 (from the past 48 hour(s))  CBC     Status: Abnormal   Collection Time    05/15/13  7:20 AM      Result Value Range   WBC 1.3 (*) 4.0 - 10.5 K/uL   Comment: RESULT REPEATED AND VERIFIED     CRITICAL RESULT CALLED TO, READ BACK BY AND VERIFIED WITH:     SPENCER, B AT 0747 ON 05.22.14 BY HOBBINS, J.   RBC 2.79 (*) 4.22 - 5.81 MIL/uL   Hemoglobin 9.1 (*) 13.0 - 17.0 g/dL   HCT 16.1 (*) 09.6 - 04.5 %   MCV 98.9  78.0 - 100.0 fL   MCH 32.6  26.0 - 34.0 pg   MCHC 33.0  30.0 - 36.0 g/dL   RDW 40.9  81.1 - 91.4 %   Platelets 123 (*) 150 - 400 K/uL   No results  found.  Assessment/Plan AML Pancytopenia For CT guided BM biopsy. Discussed procedure, risks, complications Labs reviewed. Consent signed in chart  Brayton El PA-C 05/15/2013, 8:39 AM

## 2013-05-15 NOTE — Procedures (Signed)
Interventional Radiology Procedure Note  Procedure: CT guided aspirate and core biopsy of right iliac bone Complications: None Recommendations: - Bedrest supine x 2 hrs - Hydrocodone PRN  Pain - Follow biopsy results  Signed,  Shalae Belmonte K. Joncarlos Atkison, MD Vascular & Interventional Radiologist [facility]  

## 2013-05-21 ENCOUNTER — Ambulatory Visit (HOSPITAL_BASED_OUTPATIENT_CLINIC_OR_DEPARTMENT_OTHER): Payer: Medicare Other | Admitting: Oncology

## 2013-05-21 ENCOUNTER — Telehealth: Payer: Self-pay | Admitting: Oncology

## 2013-05-21 ENCOUNTER — Other Ambulatory Visit (HOSPITAL_BASED_OUTPATIENT_CLINIC_OR_DEPARTMENT_OTHER): Payer: Medicare Other

## 2013-05-21 ENCOUNTER — Telehealth: Payer: Self-pay | Admitting: *Deleted

## 2013-05-21 VITALS — BP 122/60 | HR 61 | Temp 98.1°F | Resp 18 | Ht 68.0 in | Wt 187.9 lb

## 2013-05-21 DIAGNOSIS — M810 Age-related osteoporosis without current pathological fracture: Secondary | ICD-10-CM

## 2013-05-21 DIAGNOSIS — C9200 Acute myeloblastic leukemia, not having achieved remission: Secondary | ICD-10-CM

## 2013-05-21 DIAGNOSIS — D46Z Other myelodysplastic syndromes: Secondary | ICD-10-CM

## 2013-05-21 DIAGNOSIS — I10 Essential (primary) hypertension: Secondary | ICD-10-CM

## 2013-05-21 LAB — CBC WITH DIFFERENTIAL/PLATELET
Basophils Absolute: 0 10*3/uL (ref 0.0–0.1)
EOS%: 0.8 % (ref 0.0–7.0)
Eosinophils Absolute: 0 10*3/uL (ref 0.0–0.5)
HGB: 9 g/dL — ABNORMAL LOW (ref 13.0–17.1)
LYMPH%: 82.5 % — ABNORMAL HIGH (ref 14.0–49.0)
MCH: 32.9 pg (ref 27.2–33.4)
MCV: 100.1 fL — ABNORMAL HIGH (ref 79.3–98.0)
MONO%: 1.5 % (ref 0.0–14.0)
Platelets: 128 10*3/uL — ABNORMAL LOW (ref 140–400)
RDW: 16 % — ABNORMAL HIGH (ref 11.0–14.6)

## 2013-05-21 LAB — COMPREHENSIVE METABOLIC PANEL (CC13)
AST: 15 U/L (ref 5–34)
Albumin: 3.2 g/dL — ABNORMAL LOW (ref 3.5–5.0)
Alkaline Phosphatase: 63 U/L (ref 40–150)
Potassium: 4.7 mEq/L (ref 3.5–5.1)
Sodium: 143 mEq/L (ref 136–145)
Total Bilirubin: 0.5 mg/dL (ref 0.20–1.20)
Total Protein: 6 g/dL — ABNORMAL LOW (ref 6.4–8.3)

## 2013-05-21 NOTE — Telephone Encounter (Signed)
Per staff message and POF I have scheduled appts.  JMW  

## 2013-05-21 NOTE — Telephone Encounter (Signed)
gv and printed appt sched and avs for pt...emailed MW to add tx... °

## 2013-05-21 NOTE — Progress Notes (Signed)
[facility]  Telephone:(336) [phone] [phone]   OFFICE PROGRESS NOTE   Cc:  Gwen Pounds, MD  DIAGNOSIS: Acute myeloid leukemia, evolving most likely from past myelodysplastic syndrome (MDS).   PAST THERAPY: Biopsy only   CURRENT THERAPY:  started on 11/11/2012 Vidaza (hypomethylating chemo) d1-5, d8-9; of every 4 weeks cycle.   INTERVAL HISTORY: Kyle Brewer 79 y.o. male returns for regular follow up with his son.  He had mild fatigue for the first two weeks of each chemo cycle.  He is still able to live by himself and is independent of activities of daily living.  He denied fever, anorexia, weight loss, headache, visual changes, confusion, drenching night sweats, palpable lymph node swelling, mucositis, odynophagia, dysphagia, nausea vomiting, jaundice, chest pain, palpitation, shortness of breath, dyspnea on exertion, productive cough, gum bleeding, epistaxis, hematemesis, hemoptysis, abdominal pain, abdominal swelling, early satiety, melena, hematochezia, hematuria, skin rash, spontaneous bleeding, joint swelling, joint pain, heat or cold intolerance, bowel bladder incontinence, back pain, focal motor weakness, paresthesia, depression.      Past Medical History  Diagnosis Date  . Inguinal hernia     right  . Osteoporosis   . Hypothyroid   . Hyperlipidemia   . Arthritis   . BPH (benign prostatic hyperplasia)   . Wound, open, elbow     Right, followed [facility]  . Hypertension   . CAD (coronary artery disease)     3V CABG 1995, relook cath 2001 with patent grafts. normal stress test 2010.   . Back pain   . CKD (chronic kidney disease), stage I   . Bursitis   . Pancytopenia   . Kidney stone   . 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 MDS (myelodysplastic syndrome), high grade     Past Surgical History  Procedure Laterality Date  . Appendectomy  1968  . Coronary artery bypass graft  1995  . Total knee arthroplasty  2007   right  . Elbow arthroscopy  2012    right  . Transurethral resection of prostate      Dr. Isabel Caprice  . Hernia repair  11/07/11    lap bilateral inguinal    Current Outpatient Prescriptions  Medication Sig Dispense Refill  . alendronate (FOSAMAX) 70 MG tablet Take 70 mg by mouth every Monday. Take with a full glass of water on an empty stomach.      Marland Kitchen amLODipine (NORVASC) 5 MG tablet Take 1 tablet (5 mg total) by mouth daily before breakfast.  90 tablet  3  . aspirin 81 MG tablet Take 81 mg by mouth daily.        . Calcium Carbonate-Vitamin D (CALCIUM 600 + D PO) Take 1 tablet by mouth 2 (two) times daily.      Marland Kitchen dutasteride (AVODART) 0.5 MG capsule Take 0.5 mg by mouth every morning.       Marland Kitchen levothyroxine (SYNTHROID, LEVOTHROID) 50 MCG tablet Take 50 mcg by mouth every evening.       . lidocaine-prilocaine (EMLA) cream Apply topically as needed.  30 g  2  . lisinopril (PRINIVIL,ZESTRIL) 10 MG tablet Take 10 mg by mouth every evening.       Marland Kitchen LORazepam (ATIVAN) 0.5 MG tablet Take 0.5 mg by mouth at bedtime as needed for anxiety (sleep).       . meloxicam (MOBIC) 7.5 MG tablet Take 7.5 mg by mouth 2 (two) times daily.       Marland Kitchen  metoprolol succinate (TOPROL-XL) 100 MG 24 hr tablet Take 1 tablet (100 mg total) by mouth daily before breakfast. Take with or immediately following a meal.  90 tablet  3  . Multiple Vitamin (MULTIVITAMIN WITH MINERALS) TABS Take 1 tablet by mouth daily.      . simvastatin (ZOCOR) 20 MG tablet Take 20 mg by mouth at bedtime.        Marland Kitchen terazosin (HYTRIN) 10 MG capsule Take 10 mg by mouth at bedtime.      . vitamin B-12 (CYANOCOBALAMIN) 1000 MCG tablet Take 1,000 mcg by mouth daily.      . vitamin C (ASCORBIC ACID) 500 MG tablet Take 500 mg by mouth daily.       No current facility-administered medications for this visit.    ALLERGIES:  has No Known Allergies.  REVIEW OF SYSTEMS:  The rest of the 14-point review of system was negative.   Filed Vitals:   05/21/13  1026  BP: 122/60  Pulse: 61  Temp: 98.1 F (36.7 C)  Resp: 18   Wt Readings from Last 3 Encounters:  05/21/13 187 lb 14.4 oz (85.231 kg)  05/15/13 191 lb (86.637 kg)  04/21/13 191 lb 1.6 oz (86.682 kg)   ECOG Performance status: 0-1  PHYSICAL EXAMINATION:   General:  well-nourished man, in no acute distress.  Eyes:  no scleral icterus.  ENT:  There were no oropharyngeal lesions.  Neck was without thyromegaly.  Lymphatics:  Negative cervical, supraclavicular or axillary adenopathy.  Respiratory: lungs were clear bilaterally without wheezing or crackles.  Cardiovascular:  Regular rate and rhythm, S1/S2, without rub or gallop. There was a II/VI systolic murmur best heard in the left upper sternal border. There was no pedal edema.  GI:  abdomen was soft, flat, nontender, nondistended, without organomegaly.  Muscoloskeletal:  no spinal tenderness of palpation of vertebral spine.  Skin exam was without petichae. There was some ecchymosis on the skin over the abdomen where he had Vidaza injection last week.  There was pain, erythema to palpation.  Neuro exam was nonfocal.  Patient was able to get on and off exam table without assistance.  Gait was normal.  Patient was alerted and oriented.  Attention was good.   Language was appropriate.  Mood was normal without depression.  Speech was not pressured.  Thought content was not tangential.     LABORATORY/RADIOLOGY DATA:  Lab Results  Component Value Date   WBC 1.4* 05/21/2013   HGB 9.0* 05/21/2013   HCT 27.5* 05/21/2013   PLT 128* 05/21/2013   GLUCOSE 74 05/21/2013   ALKPHOS 63 05/21/2013   ALT 15 05/21/2013   AST 15 05/21/2013   NA 143 05/21/2013   K 4.7 05/21/2013   CL 113* 05/21/2013   CREATININE 1.7* 05/21/2013   BUN 31.8* 05/21/2013   CO2 23 05/21/2013   INR 1.04 05/15/2013     ASSESSMENT AND PLAN:   1. Hypertension: He is on amlodipine, lisinopril, metoprolol.  2. Hyperlipidemia: He is on simvastatin.  3. CAD: He is on ASA, simvastatin,  lisinopril, and metoprolol.  4. Hypothyroidism: He is on Synthroid.  5. BPH: He is on dutasteride and terazosin.  6. Osteoporosis: He is on Fosamax.  7. 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  He has not required blood transfusion in a while.  He has grade 1-2 fatigue.  However, he does not have dose-limiting toxicity.  I recommended that he continue with current Vidaza dose and schedule.  No repeat bone marrow biopsy is indicated unless he has significantly worsened cytopenia or increase in peripheral blast.  The goal of therapy is indefinite Vidaza until he has severe dose-limiting toxicity or it is no longer effective.  At that time, we may consider switching to decitabine.   Mr. Kozak and his son expressed informed understanding and wished to continue Vidaza.   8.  Pancytopenia:  Due to MDS/AML.  There is no active bleeding.  There is no indication for transfusion today.  Goal of transfusion Hgb <7 or 8; Plt <10K or active bleeding.  Will check his CBC every two weeks since he has not needed transfusion for a while.   9 . Code status: FULL CODE.   The length of time of the face-to-face encounter was 25 minutes. More than 50% of time was spent counseling and coordination of care.

## 2013-05-22 ENCOUNTER — Ambulatory Visit (HOSPITAL_BASED_OUTPATIENT_CLINIC_OR_DEPARTMENT_OTHER): Payer: Medicare Other

## 2013-05-22 DIAGNOSIS — C9200 Acute myeloblastic leukemia, not having achieved remission: Secondary | ICD-10-CM

## 2013-05-22 DIAGNOSIS — D46Z Other myelodysplastic syndromes: Secondary | ICD-10-CM

## 2013-05-22 DIAGNOSIS — Z5111 Encounter for antineoplastic chemotherapy: Secondary | ICD-10-CM

## 2013-05-22 MED ORDER — ONDANSETRON HCL 8 MG PO TABS
8.0000 mg | ORAL_TABLET | Freq: Once | ORAL | Status: AC
  Administered 2013-05-22: 8 mg via ORAL

## 2013-05-22 MED ORDER — AZACITIDINE CHEMO SQ INJECTION
100.0000 mg/m2 | Freq: Once | INTRAMUSCULAR | Status: AC
  Administered 2013-05-22: 200 mg via SUBCUTANEOUS
  Filled 2013-05-22: qty 8

## 2013-05-22 NOTE — Patient Instructions (Addendum)
[facility] Discharge Instructions for Patients Receiving Chemotherapy  Today you received the following chemotherapy agents: Vidaza. To help prevent nausea and vomiting after your treatment, we encourage you to take your nausea medication, Zofran. Take it every eight hours if needed for nausea.   If you develop nausea and vomiting that is not controlled by your nausea medication, call the clinic. If it is after clinic hours your family physician or the after hours number for the clinic or go to the Emergency Department.   BELOW ARE SYMPTOMS THAT SHOULD BE REPORTED IMMEDIATELY:  *FEVER GREATER THAN 100.5 F  *CHILLS WITH OR WITHOUT FEVER  NAUSEA AND VOMITING THAT IS NOT CONTROLLED WITH YOUR NAUSEA MEDICATION  *UNUSUAL SHORTNESS OF BREATH  *UNUSUAL BRUISING OR BLEEDING  TENDERNESS IN MOUTH AND THROAT WITH OR WITHOUT PRESENCE OF ULCERS  *URINARY PROBLEMS  *BOWEL PROBLEMS  UNUSUAL RASH Items with * indicate a potential emergency and should be followed up as soon as possible.  Feel free to call the clinic you have any questions or concerns. The clinic phone number is [phone].   I have been informed and understand all the instructions given to me. I know to contact the clinic, my physician, or go to the Emergency Department if any problems should occur. I do not have any questions at this time, but understand that I may call the clinic during office hours   should I have any questions or need assistance in obtaining follow up care.

## 2013-05-23 ENCOUNTER — Ambulatory Visit (HOSPITAL_BASED_OUTPATIENT_CLINIC_OR_DEPARTMENT_OTHER): Payer: Medicare Other

## 2013-05-23 VITALS — BP 130/53 | HR 62 | Temp 98.6°F | Resp 20

## 2013-05-23 DIAGNOSIS — D46Z Other myelodysplastic syndromes: Secondary | ICD-10-CM

## 2013-05-23 DIAGNOSIS — C9200 Acute myeloblastic leukemia, not having achieved remission: Secondary | ICD-10-CM

## 2013-05-23 DIAGNOSIS — Z5111 Encounter for antineoplastic chemotherapy: Secondary | ICD-10-CM

## 2013-05-23 MED ORDER — ONDANSETRON HCL 8 MG PO TABS
8.0000 mg | ORAL_TABLET | Freq: Once | ORAL | Status: AC
  Administered 2013-05-23: 8 mg via ORAL

## 2013-05-23 MED ORDER — AZACITIDINE CHEMO SQ INJECTION
100.0000 mg/m2 | Freq: Once | INTRAMUSCULAR | Status: AC
  Administered 2013-05-23: 200 mg via SUBCUTANEOUS
  Filled 2013-05-23: qty 8

## 2013-05-23 NOTE — Patient Instructions (Addendum)
Azacitidine suspension for injection (subcutaneous use) What is this medicine? AZACITIDINE (ay za SITE i deen) is a chemotherapy drug. This medicine reduces the growth of cancer cells and can suppress the immune system. It is used for treating myelodysplastic syndrome or some types of leukemia. This medicine may be used for other purposes; ask your health care provider or pharmacist if you have questions. What should I tell my health care provider before I take this medicine? They need to know if you have any of these conditions: -infection (especially a virus infection such as chickenpox, cold sores, or herpes) -kidney disease -liver disease -liver tumors -an unusual or allergic reaction to azacitidine, mannitol, other medicines, foods, dyes, or preservatives -pregnant or trying to get pregnant -breast-feeding How should I use this medicine? This medicine is for injection under the skin. It is administered in a hospital or clinic by a specially trained health care professional. Talk to your pediatrician regarding the use of this medicine in children. While this drug may be prescribed for selected conditions, precautions do apply. Overdosage: If you think you have taken too much of this medicine contact a poison control center or emergency room at once. NOTE: This medicine is only for you. Do not share this medicine with others. What if I miss a dose? It is important not to miss your dose. Call your doctor or health care professional if you are unable to keep an appointment. What may interact with this medicine? -vaccines Talk to your doctor or health care professional before taking any of these medicines: -acetaminophen -aspirin -ibuprofen -ketoprofen -naproxen This list may not describe all possible interactions. Give your health care provider a list of all the medicines, herbs, non-prescription drugs, or dietary supplements you use. Also tell them if you smoke, drink alcohol, or use  illegal drugs. Some items may interact with your medicine. What should I watch for while using this medicine? Visit your doctor for checks on your progress. This drug may make you feel generally unwell. This is not uncommon, as chemotherapy can affect healthy cells as well as cancer cells. Report any side effects. Continue your course of treatment even though you feel ill unless your doctor tells you to stop. In some cases, you may be given additional medicines to help with side effects. Follow all directions for their use. Call your doctor or health care professional for advice if you get a fever, chills or sore throat, or other symptoms of a cold or flu. Do not treat yourself. This drug decreases your body's ability to fight infections. Try to avoid being around people who are sick. This medicine may increase your risk to bruise or bleed. Call your doctor or health care professional if you notice any unusual bleeding. Be careful brushing and flossing your teeth or using a toothpick because you may get an infection or bleed more easily. If you have any dental work done, tell your dentist you are receiving this medicine. Avoid taking products that contain aspirin, acetaminophen, ibuprofen, naproxen, or ketoprofen unless instructed by your doctor. These medicines may hide a fever. Do not have any vaccinations without your doctor's approval and avoid anyone who has recently had oral polio vaccine. Do not become pregnant while taking this medicine. Women should inform their doctor if they wish to become pregnant or think they might be pregnant. There is a potential for serious side effects to an unborn child. Talk to your health care professional or pharmacist for more information. Do not breast-feed an   infant while taking this medicine. If you are a man, you should not father a child while receiving treatment. What side effects may I notice from receiving this medicine? Side effects that you should report  to your doctor or health care professional as soon as possible: -allergic reactions like skin rash, itching or hives, swelling of the face, lips, or tongue -low blood counts - this medicine may decrease the number of white blood cells, red blood cells and platelets. You may be at increased risk for infections and bleeding. -signs of infection - fever or chills, cough, sore throat, pain or difficulty passing urine -signs of decreased platelets or bleeding - bruising, pinpoint red spots on the skin, black, tarry stools, blood in the urine -signs of decreased red blood cells - unusually weak or tired, fainting spells, lightheadedness -reactions at the injection site including redness, pain, itching, or bruising -breathing problems -changes in vision -fever -mouth sores -stomach pain -vomiting Side effects that usually do not require medical attention (report to your doctor or health care professional if they continue or are bothersome): -constipation -diarrhea -loss of appetite -nausea -pain or redness at the injection site -weak or tired This list may not describe all possible side effects. Call your doctor for medical advice about side effects. You may report side effects to FDA at 1-800-FDA-1088. Where should I keep my medicine? This drug is given in a hospital or clinic and will not be stored at home. NOTE: This sheet is a summary. It may not cover all possible information. If you have questions about this medicine, talk to your doctor, pharmacist, or health care provider.  2013, [employer]. (03/05/2008 11:04:07 AM)  

## 2013-05-26 ENCOUNTER — Ambulatory Visit (HOSPITAL_BASED_OUTPATIENT_CLINIC_OR_DEPARTMENT_OTHER): Payer: Medicare Other

## 2013-05-26 VITALS — BP 129/61 | HR 62 | Temp 97.9°F | Resp 18

## 2013-05-26 DIAGNOSIS — C9200 Acute myeloblastic leukemia, not having achieved remission: Secondary | ICD-10-CM

## 2013-05-26 DIAGNOSIS — D46Z Other myelodysplastic syndromes: Secondary | ICD-10-CM

## 2013-05-26 DIAGNOSIS — Z5111 Encounter for antineoplastic chemotherapy: Secondary | ICD-10-CM

## 2013-05-26 MED ORDER — ONDANSETRON HCL 8 MG PO TABS
8.0000 mg | ORAL_TABLET | Freq: Once | ORAL | Status: AC
  Administered 2013-05-26: 8 mg via ORAL

## 2013-05-26 MED ORDER — AZACITIDINE CHEMO SQ INJECTION
100.0000 mg/m2 | Freq: Once | INTRAMUSCULAR | Status: AC
  Administered 2013-05-26: 200 mg via SUBCUTANEOUS
  Filled 2013-05-26: qty 8

## 2013-05-26 NOTE — Patient Instructions (Signed)
Azacitidine suspension for injection (subcutaneous use) What is this medicine? AZACITIDINE (ay za SITE i deen) is a chemotherapy drug. This medicine reduces the growth of cancer cells and can suppress the immune system. It is used for treating myelodysplastic syndrome or some types of leukemia. This medicine may be used for other purposes; ask your health care provider or pharmacist if you have questions. What should I tell my health care provider before I take this medicine? They need to know if you have any of these conditions: -infection (especially a virus infection such as chickenpox, cold sores, or herpes) -kidney disease -liver disease -liver tumors -an unusual or allergic reaction to azacitidine, mannitol, other medicines, foods, dyes, or preservatives -pregnant or trying to get pregnant -breast-feeding How should I use this medicine? This medicine is for injection under the skin. It is administered in a hospital or clinic by a specially trained health care professional. Talk to your pediatrician regarding the use of this medicine in children. While this drug may be prescribed for selected conditions, precautions do apply. Overdosage: If you think you have taken too much of this medicine contact a poison control center or emergency room at once. NOTE: This medicine is only for you. Do not share this medicine with others. What if I miss a dose? It is important not to miss your dose. Call your doctor or health care professional if you are unable to keep an appointment. What may interact with this medicine? -vaccines Talk to your doctor or health care professional before taking any of these medicines: -acetaminophen -aspirin -ibuprofen -ketoprofen -naproxen This list may not describe all possible interactions. Give your health care provider a list of all the medicines, herbs, non-prescription drugs, or dietary supplements you use. Also tell them if you smoke, drink alcohol, or use  illegal drugs. Some items may interact with your medicine. What should I watch for while using this medicine? Visit your doctor for checks on your progress. This drug may make you feel generally unwell. This is not uncommon, as chemotherapy can affect healthy cells as well as cancer cells. Report any side effects. Continue your course of treatment even though you feel ill unless your doctor tells you to stop. In some cases, you may be given additional medicines to help with side effects. Follow all directions for their use. Call your doctor or health care professional for advice if you get a fever, chills or sore throat, or other symptoms of a cold or flu. Do not treat yourself. This drug decreases your body's ability to fight infections. Try to avoid being around people who are sick. This medicine may increase your risk to bruise or bleed. Call your doctor or health care professional if you notice any unusual bleeding. Be careful brushing and flossing your teeth or using a toothpick because you may get an infection or bleed more easily. If you have any dental work done, tell your dentist you are receiving this medicine. Avoid taking products that contain aspirin, acetaminophen, ibuprofen, naproxen, or ketoprofen unless instructed by your doctor. These medicines may hide a fever. Do not have any vaccinations without your doctor's approval and avoid anyone who has recently had oral polio vaccine. Do not become pregnant while taking this medicine. Women should inform their doctor if they wish to become pregnant or think they might be pregnant. There is a potential for serious side effects to an unborn child. Talk to your health care professional or pharmacist for more information. Do not breast-feed an   infant while taking this medicine. If you are a man, you should not father a child while receiving treatment. What side effects may I notice from receiving this medicine? Side effects that you should report  to your doctor or health care professional as soon as possible: -allergic reactions like skin rash, itching or hives, swelling of the face, lips, or tongue -low blood counts - this medicine may decrease the number of white blood cells, red blood cells and platelets. You may be at increased risk for infections and bleeding. -signs of infection - fever or chills, cough, sore throat, pain or difficulty passing urine -signs of decreased platelets or bleeding - bruising, pinpoint red spots on the skin, black, tarry stools, blood in the urine -signs of decreased red blood cells - unusually weak or tired, fainting spells, lightheadedness -reactions at the injection site including redness, pain, itching, or bruising -breathing problems -changes in vision -fever -mouth sores -stomach pain -vomiting Side effects that usually do not require medical attention (report to your doctor or health care professional if they continue or are bothersome): -constipation -diarrhea -loss of appetite -nausea -pain or redness at the injection site -weak or tired This list may not describe all possible side effects. Call your doctor for medical advice about side effects. You may report side effects to FDA at 1-800-FDA-1088. Where should I keep my medicine? This drug is given in a hospital or clinic and will not be stored at home. NOTE: This sheet is a summary. It may not cover all possible information. If you have questions about this medicine, talk to your doctor, pharmacist, or health care provider.  2013, [employer]. (03/05/2008 11:04:07 AM)  

## 2013-05-27 ENCOUNTER — Ambulatory Visit (HOSPITAL_BASED_OUTPATIENT_CLINIC_OR_DEPARTMENT_OTHER): Payer: Medicare Other

## 2013-05-27 ENCOUNTER — Encounter (HOSPITAL_COMMUNITY)
Admission: RE | Admit: 2013-05-27 | Discharge: 2013-05-27 | Disposition: A | Discharge status: Home / Self Care | Payer: Medicare Other | Source: Ambulatory Visit | Attending: Oncology | Admitting: Oncology

## 2013-05-27 VITALS — BP 112/58 | HR 62 | Temp 97.9°F | Resp 18

## 2013-05-27 DIAGNOSIS — Z5111 Encounter for antineoplastic chemotherapy: Secondary | ICD-10-CM

## 2013-05-27 DIAGNOSIS — D649 Anemia, unspecified: Secondary | ICD-10-CM | POA: Insufficient documentation

## 2013-05-27 DIAGNOSIS — C9200 Acute myeloblastic leukemia, not having achieved remission: Secondary | ICD-10-CM

## 2013-05-27 DIAGNOSIS — D46Z Other myelodysplastic syndromes: Secondary | ICD-10-CM

## 2013-05-27 MED ORDER — ONDANSETRON HCL 8 MG PO TABS
8.0000 mg | ORAL_TABLET | Freq: Once | ORAL | Status: AC
  Administered 2013-05-27: 8 mg via ORAL

## 2013-05-27 MED ORDER — AZACITIDINE CHEMO SQ INJECTION
100.0000 mg/m2 | Freq: Once | INTRAMUSCULAR | Status: AC
  Administered 2013-05-27: 200 mg via SUBCUTANEOUS
  Filled 2013-05-27: qty 8

## 2013-05-27 NOTE — Patient Instructions (Signed)
[facility] Discharge Instructions for Patients Receiving Chemotherapy  Today you received the following chemotherapy agents Vidaza  To help prevent nausea and vomiting after your treatment, we encourage you to take your nausea medication    If you develop nausea and vomiting that is not controlled by your nausea medication, call the clinic.   BELOW ARE SYMPTOMS THAT SHOULD BE REPORTED IMMEDIATELY:  *FEVER GREATER THAN 100.5 F  *CHILLS WITH OR WITHOUT FEVER  NAUSEA AND VOMITING THAT IS NOT CONTROLLED WITH YOUR NAUSEA MEDICATION  *UNUSUAL SHORTNESS OF BREATH  *UNUSUAL BRUISING OR BLEEDING  TENDERNESS IN MOUTH AND THROAT WITH OR WITHOUT PRESENCE OF ULCERS  *URINARY PROBLEMS  *BOWEL PROBLEMS  UNUSUAL RASH Items with * indicate a potential emergency and should be followed up as soon as possible.  Feel free to call the clinic you have any questions or concerns. The clinic phone number is [phone].    

## 2013-05-28 ENCOUNTER — Ambulatory Visit (HOSPITAL_BASED_OUTPATIENT_CLINIC_OR_DEPARTMENT_OTHER): Payer: Medicare Other

## 2013-05-28 VITALS — BP 108/53 | HR 62 | Temp 96.6°F | Resp 18

## 2013-05-28 DIAGNOSIS — Z5111 Encounter for antineoplastic chemotherapy: Secondary | ICD-10-CM

## 2013-05-28 DIAGNOSIS — C9200 Acute myeloblastic leukemia, not having achieved remission: Secondary | ICD-10-CM

## 2013-05-28 DIAGNOSIS — D46Z Other myelodysplastic syndromes: Secondary | ICD-10-CM

## 2013-05-28 MED ORDER — ONDANSETRON HCL 8 MG PO TABS
8.0000 mg | ORAL_TABLET | Freq: Once | ORAL | Status: AC
  Administered 2013-05-28: 8 mg via ORAL

## 2013-05-28 MED ORDER — AZACITIDINE CHEMO SQ INJECTION
100.0000 mg/m2 | Freq: Once | INTRAMUSCULAR | Status: AC
  Administered 2013-05-28: 200 mg via SUBCUTANEOUS
  Filled 2013-05-28: qty 8

## 2013-05-28 NOTE — Patient Instructions (Addendum)
[facility] Discharge Instructions for Patients Receiving Chemotherapy  Today you received the following chemotherapy agents :  Vidaza.  To help prevent nausea and vomiting after your treatment, we encourage you to take your nausea medication as instructed by your physician.   If you develop nausea and vomiting that is not controlled by your nausea medication, call the clinic.   BELOW ARE SYMPTOMS THAT SHOULD BE REPORTED IMMEDIATELY:  *FEVER GREATER THAN 100.5 F  *CHILLS WITH OR WITHOUT FEVER  NAUSEA AND VOMITING THAT IS NOT CONTROLLED WITH YOUR NAUSEA MEDICATION  *UNUSUAL SHORTNESS OF BREATH  *UNUSUAL BRUISING OR BLEEDING  TENDERNESS IN MOUTH AND THROAT WITH OR WITHOUT PRESENCE OF ULCERS  *URINARY PROBLEMS  *BOWEL PROBLEMS  UNUSUAL RASH Items with * indicate a potential emergency and should be followed up as soon as possible.  Feel free to call the clinic you have any questions or concerns. The clinic phone number is [phone].    

## 2013-05-29 ENCOUNTER — Ambulatory Visit (HOSPITAL_BASED_OUTPATIENT_CLINIC_OR_DEPARTMENT_OTHER): Payer: Medicare Other

## 2013-05-29 VITALS — BP 103/77 | HR 75 | Temp 98.2°F | Resp 18

## 2013-05-29 DIAGNOSIS — D46Z Other myelodysplastic syndromes: Secondary | ICD-10-CM

## 2013-05-29 DIAGNOSIS — C9200 Acute myeloblastic leukemia, not having achieved remission: Secondary | ICD-10-CM

## 2013-05-29 DIAGNOSIS — Z5111 Encounter for antineoplastic chemotherapy: Secondary | ICD-10-CM

## 2013-05-29 MED ORDER — ONDANSETRON HCL 8 MG PO TABS
8.0000 mg | ORAL_TABLET | Freq: Once | ORAL | Status: AC
  Administered 2013-05-29: 8 mg via ORAL

## 2013-05-29 MED ORDER — AZACITIDINE CHEMO SQ INJECTION
100.0000 mg/m2 | Freq: Once | INTRAMUSCULAR | Status: AC
  Administered 2013-05-29: 200 mg via SUBCUTANEOUS
  Filled 2013-05-29: qty 8

## 2013-05-29 NOTE — Patient Instructions (Signed)
[facility] Discharge Instructions for Patients Receiving Chemotherapy  Today you received the following chemotherapy agents Vidaza  To help prevent nausea and vomiting after your treatment, we encourage you to take your nausea medication    If you develop nausea and vomiting that is not controlled by your nausea medication, call the clinic.   BELOW ARE SYMPTOMS THAT SHOULD BE REPORTED IMMEDIATELY:  *FEVER GREATER THAN 100.5 F  *CHILLS WITH OR WITHOUT FEVER  NAUSEA AND VOMITING THAT IS NOT CONTROLLED WITH YOUR NAUSEA MEDICATION  *UNUSUAL SHORTNESS OF BREATH  *UNUSUAL BRUISING OR BLEEDING  TENDERNESS IN MOUTH AND THROAT WITH OR WITHOUT PRESENCE OF ULCERS  *URINARY PROBLEMS  *BOWEL PROBLEMS  UNUSUAL RASH Items with * indicate a potential emergency and should be followed up as soon as possible.  Feel free to call the clinic you have any questions or concerns. The clinic phone number is [phone].    

## 2013-05-30 ENCOUNTER — Ambulatory Visit (HOSPITAL_BASED_OUTPATIENT_CLINIC_OR_DEPARTMENT_OTHER): Payer: Medicare Other

## 2013-05-30 ENCOUNTER — Other Ambulatory Visit: Payer: Self-pay | Admitting: Oncology

## 2013-05-30 ENCOUNTER — Encounter: Payer: Self-pay | Admitting: Oncology

## 2013-05-30 VITALS — BP 118/62 | HR 65 | Temp 98.0°F | Resp 20

## 2013-05-30 DIAGNOSIS — C9200 Acute myeloblastic leukemia, not having achieved remission: Secondary | ICD-10-CM

## 2013-05-30 DIAGNOSIS — D46Z Other myelodysplastic syndromes: Secondary | ICD-10-CM

## 2013-05-30 DIAGNOSIS — Z5111 Encounter for antineoplastic chemotherapy: Secondary | ICD-10-CM

## 2013-05-30 MED ORDER — ONDANSETRON HCL 8 MG PO TABS
8.0000 mg | ORAL_TABLET | Freq: Once | ORAL | Status: AC
  Administered 2013-05-30: 8 mg via ORAL

## 2013-05-30 MED ORDER — AZACITIDINE CHEMO SQ INJECTION
100.0000 mg/m2 | Freq: Once | INTRAMUSCULAR | Status: AC
  Administered 2013-05-30: 200 mg via SUBCUTANEOUS
  Filled 2013-05-30: qty 8

## 2013-05-30 NOTE — Patient Instructions (Addendum)
[facility] Discharge Instructions for Patients Receiving Chemotherapy  Today you received the following chemotherapy agents Vidaza. To help prevent nausea and vomiting after your treatment, we encourage you to take your nausea medication as prescribed.    If you develop nausea and vomiting that is not controlled by your nausea medication, call the clinic.   BELOW ARE SYMPTOMS THAT SHOULD BE REPORTED IMMEDIATELY:  *FEVER GREATER THAN 100.5 F  *CHILLS WITH OR WITHOUT FEVER  NAUSEA AND VOMITING THAT IS NOT CONTROLLED WITH YOUR NAUSEA MEDICATION  *UNUSUAL SHORTNESS OF BREATH  *UNUSUAL BRUISING OR BLEEDING  TENDERNESS IN MOUTH AND THROAT WITH OR WITHOUT PRESENCE OF ULCERS  *URINARY PROBLEMS  *BOWEL PROBLEMS  UNUSUAL RASH Items with * indicate a potential emergency and should be followed up as soon as possible.  Feel free to call the clinic you have any questions or concerns. The clinic phone number is [phone].    

## 2013-06-09 ENCOUNTER — Other Ambulatory Visit (HOSPITAL_BASED_OUTPATIENT_CLINIC_OR_DEPARTMENT_OTHER): Payer: Medicare Other | Admitting: Lab

## 2013-06-09 ENCOUNTER — Ambulatory Visit: Payer: Medicare Other

## 2013-06-09 ENCOUNTER — Other Ambulatory Visit: Payer: Self-pay | Admitting: Oncology

## 2013-06-09 DIAGNOSIS — C9200 Acute myeloblastic leukemia, not having achieved remission: Secondary | ICD-10-CM

## 2013-06-09 DIAGNOSIS — D46Z Other myelodysplastic syndromes: Secondary | ICD-10-CM

## 2013-06-09 LAB — CBC WITH DIFFERENTIAL/PLATELET
BASO%: 0 % (ref 0.0–2.0)
HCT: 27.6 % — ABNORMAL LOW (ref 38.4–49.9)
LYMPH%: 42.5 % (ref 14.0–49.0)
MCH: 31.1 pg (ref 27.2–33.4)
MCHC: 32.2 g/dL (ref 32.0–36.0)
MCV: 96.5 fL (ref 79.3–98.0)
MONO#: 0.1 10*3/uL (ref 0.1–0.9)
NEUT%: 50.8 % (ref 39.0–75.0)
Platelets: 114 10*3/uL — ABNORMAL LOW (ref 140–400)
WBC: 1.9 10*3/uL — ABNORMAL LOW (ref 4.0–10.3)

## 2013-06-09 NOTE — Progress Notes (Unsigned)
S/W pt in lobby.  Informed of no need for transfusion today. Hgb 8.9.  Instructed to return next week as scheduled. He verbalized understanding.

## 2013-06-13 ENCOUNTER — Encounter: Payer: Self-pay | Admitting: Certified Registered Nurse Anesthetist

## 2013-06-13 NOTE — Progress Notes (Signed)
Verified with Pharmacist Anola Gurney. 4 syringes of 2 ml each of 5 aza were dispensed on 04/01/2013.  HL

## 2013-06-18 ENCOUNTER — Ambulatory Visit (HOSPITAL_BASED_OUTPATIENT_CLINIC_OR_DEPARTMENT_OTHER): Payer: Medicare Other

## 2013-06-18 ENCOUNTER — Telehealth: Payer: Self-pay | Admitting: *Deleted

## 2013-06-18 ENCOUNTER — Other Ambulatory Visit (HOSPITAL_BASED_OUTPATIENT_CLINIC_OR_DEPARTMENT_OTHER): Payer: Medicare Other

## 2013-06-18 ENCOUNTER — Ambulatory Visit (HOSPITAL_BASED_OUTPATIENT_CLINIC_OR_DEPARTMENT_OTHER): Payer: Medicare Other | Admitting: Oncology

## 2013-06-18 ENCOUNTER — Telehealth: Payer: Self-pay | Admitting: Oncology

## 2013-06-18 VITALS — BP 123/67 | HR 64 | Temp 98.3°F | Resp 18 | Ht 68.0 in | Wt 185.2 lb

## 2013-06-18 DIAGNOSIS — C9200 Acute myeloblastic leukemia, not having achieved remission: Secondary | ICD-10-CM

## 2013-06-18 DIAGNOSIS — D46Z Other myelodysplastic syndromes: Secondary | ICD-10-CM

## 2013-06-18 DIAGNOSIS — Z5111 Encounter for antineoplastic chemotherapy: Secondary | ICD-10-CM

## 2013-06-18 LAB — CBC WITH DIFFERENTIAL/PLATELET
Basophils Absolute: 0 10*3/uL (ref 0.0–0.1)
Eosinophils Absolute: 0 10*3/uL (ref 0.0–0.5)
LYMPH%: 67.1 % — ABNORMAL HIGH (ref 14.0–49.0)
MCV: 96.1 fL (ref 79.3–98.0)
MONO%: 1.9 % (ref 0.0–14.0)
NEUT#: 0.5 10*3/uL — CL (ref 1.5–6.5)
Platelets: 181 10*3/uL (ref 140–400)
RBC: 3.08 10*6/uL — ABNORMAL LOW (ref 4.20–5.82)

## 2013-06-18 LAB — COMPREHENSIVE METABOLIC PANEL (CC13)
ALT: 14 U/L (ref 0–55)
AST: 13 U/L (ref 5–34)
Albumin: 3.3 g/dL — ABNORMAL LOW (ref 3.5–5.0)
Alkaline Phosphatase: 63 U/L (ref 40–150)
BUN: 26.3 mg/dL — ABNORMAL HIGH (ref 7.0–26.0)
Calcium: 8.9 mg/dL (ref 8.4–10.4)
Chloride: 111 mEq/L — ABNORMAL HIGH (ref 98–107)
Potassium: 4.3 mEq/L (ref 3.5–5.1)

## 2013-06-18 LAB — HOLD TUBE, BLOOD BANK

## 2013-06-18 MED ORDER — AZACITIDINE CHEMO SQ INJECTION
100.0000 mg/m2 | Freq: Once | INTRAMUSCULAR | Status: AC
  Administered 2013-06-18: 200 mg via SUBCUTANEOUS
  Filled 2013-06-18: qty 8

## 2013-06-18 MED ORDER — ONDANSETRON HCL 8 MG PO TABS
8.0000 mg | ORAL_TABLET | Freq: Once | ORAL | Status: AC
  Administered 2013-06-18: 8 mg via ORAL

## 2013-06-18 NOTE — Patient Instructions (Signed)
[facility] Discharge Instructions for Patients Receiving Chemotherapy  Today you received the following chemotherapy agents Vidaza. To help prevent nausea and vomiting after your treatment, we encourage you to take your nausea medication as prescribed.    If you develop nausea and vomiting that is not controlled by your nausea medication, call the clinic.   BELOW ARE SYMPTOMS THAT SHOULD BE REPORTED IMMEDIATELY:  *FEVER GREATER THAN 100.5 F  *CHILLS WITH OR WITHOUT FEVER  NAUSEA AND VOMITING THAT IS NOT CONTROLLED WITH YOUR NAUSEA MEDICATION  *UNUSUAL SHORTNESS OF BREATH  *UNUSUAL BRUISING OR BLEEDING  TENDERNESS IN MOUTH AND THROAT WITH OR WITHOUT PRESENCE OF ULCERS  *URINARY PROBLEMS  *BOWEL PROBLEMS  UNUSUAL RASH Items with * indicate a potential emergency and should be followed up as soon as possible.  Feel free to call the clinic you have any questions or concerns. The clinic phone number is [phone].    

## 2013-06-18 NOTE — Progress Notes (Signed)
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   CURRENT THERAPY:  started on 11/11/2012 Vidaza  d1-5, d8-9; of every 4 weeks cycle.   INTERVAL HISTORY: Kyle Brewer 79 y.o. male returns for regular follow up with his son.  He reported feeling well.  For about a week after chemo, he has mild fatigue.  However, he bounced back. He is still able to live by himself, grocery shop, cook, clean the house.  He has chronic constipation for which he takes stool softener.  Patient denies fever, anorexia, weight loss, headache, visual changes, confusion, drenching night sweats, palpable lymph node swelling, mucositis, odynophagia, dysphagia, nausea vomiting, jaundice, chest pain, palpitation, shortness of breath, dyspnea on exertion, productive cough, gum bleeding, epistaxis, hematemesis, hemoptysis, abdominal pain, abdominal swelling, early satiety, melena, hematochezia, hematuria, skin rash, spontaneous bleeding, joint swelling, joint pain, heat or cold intolerance, bowel bladder incontinence, back pain, focal motor weakness, paresthesia, depression.      Past Medical History  Diagnosis Date  . Inguinal hernia     right  . Osteoporosis   . Hypothyroid   . Hyperlipidemia   . Arthritis   . BPH (benign prostatic hyperplasia)   . Wound, open, elbow     Right, followed [facility]  . Hypertension   . CAD (coronary artery disease)     3V CABG 1995, relook cath 2001 with patent grafts. normal stress test 2010.   . Back pain   . CKD (chronic kidney disease), stage I   . Bursitis   . Pancytopenia   . Kidney stone   . 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 MDS (myelodysplastic syndrome), high grade     bone marrow biposy on 10/21/12 was normal; 02/21/12 showed trisomy 8; 05/15/2013 showed trisomy 8.     Past  Surgical History  Procedure Laterality Date  . Appendectomy  1968  . Coronary artery bypass graft  1995  . Total knee arthroplasty  2007    right  . Elbow arthroscopy  2012    right  . Transurethral resection of prostate      Dr. Isabel Caprice  . Hernia repair  11/07/11    lap bilateral inguinal    Current Outpatient Prescriptions  Medication Sig Dispense Refill  . alendronate (FOSAMAX) 70 MG tablet Take 70 mg by mouth every Monday. Take with a full glass of water on an empty stomach.      Marland Kitchen amLODipine (NORVASC) 5 MG tablet Take 1 tablet (5 mg total) by mouth daily before breakfast.  90 tablet  3  . aspirin 81 MG tablet Take 81 mg by mouth daily.        . Calcium Carbonate-Vitamin D (CALCIUM 600 + D PO) Take 1 tablet by mouth 2 (two) times daily.      Marland Kitchen dutasteride (AVODART) 0.5 MG capsule Take 0.5 mg by mouth every morning.       Marland Kitchen levothyroxine (SYNTHROID, LEVOTHROID) 50 MCG tablet Take 50 mcg by mouth every evening.       . lidocaine-prilocaine (EMLA) cream Apply topically as needed.  30 g  2  . lisinopril (PRINIVIL,ZESTRIL) 10 MG tablet Take 10 mg by mouth every evening.       Marland Kitchen LORazepam (ATIVAN) 0.5 MG tablet Take 0.5 mg by mouth at bedtime as needed for  anxiety (sleep).       . meloxicam (MOBIC) 7.5 MG tablet Take 7.5 mg by mouth 2 (two) times daily.       . metoprolol succinate (TOPROL-XL) 100 MG 24 hr tablet Take 1 tablet (100 mg total) by mouth daily before breakfast. Take with or immediately following a meal.  90 tablet  3  . Multiple Vitamin (MULTIVITAMIN WITH MINERALS) TABS Take 1 tablet by mouth daily.      . simvastatin (ZOCOR) 20 MG tablet Take 20 mg by mouth at bedtime.        Marland Kitchen terazosin (HYTRIN) 10 MG capsule Take 10 mg by mouth at bedtime.      . vitamin B-12 (CYANOCOBALAMIN) 1000 MCG tablet Take 1,000 mcg by mouth daily.      . vitamin C (ASCORBIC ACID) 500 MG tablet Take 500 mg by mouth daily.       No current facility-administered medications for this visit.    Facility-Administered Medications Ordered in Other Visits  Medication Dose Route Frequency Provider Last Rate Last Dose  . azaCITIDine (VIDAZA) chemo injection 200 mg  100 mg/m2 (Order-Specific) Subcutaneous Once Exie Parody, MD        ALLERGIES:  has No Known Allergies.  REVIEW OF SYSTEMS:  The rest of the 14-point review of system was negative.   Filed Vitals:   06/18/13 0913  BP: 123/67  Pulse: 64  Temp: 98.3 F (36.8 C)  Resp: 18   Wt Readings from Last 3 Encounters:  06/18/13 185 lb 3.2 oz (84.006 kg)  05/21/13 187 lb 14.4 oz (85.231 kg)  05/15/13 191 lb (86.637 kg)   ECOG Performance status: 0-1  PHYSICAL EXAMINATION:   General:  well-nourished man, in no acute distress.  Eyes:  no scleral icterus.  ENT:  There were no oropharyngeal lesions.  Neck was without thyromegaly.  Lymphatics:  Negative cervical, supraclavicular or axillary adenopathy.  Respiratory: lungs were clear bilaterally without wheezing or crackles.  Cardiovascular:  Regular rate and rhythm, S1/S2, without rub or gallop. There was a II/VI systolic murmur best heard in the left upper sternal border. There was no pedal edema.  GI:  abdomen was soft, flat, nontender, nondistended, without organomegaly.  Muscoloskeletal:  no spinal tenderness of palpation of vertebral spine.  Skin exam was without petichae. There was some ecchymosis on the skin over the abdomen where he had Vidaza injection last week.  There was pain, erythema to palpation.  Neuro exam was nonfocal.  Patient was able to get on and off exam table without assistance.  Gait was normal.  Patient was alerted and oriented.  Attention was good.   Language was appropriate.  Mood was normal without depression.  Speech was not pressured.  Thought content was not tangential.     LABORATORY/RADIOLOGY DATA:  Lab Results  Component Value Date   WBC 1.6* 06/18/2013   HGB 9.5* 06/18/2013   HCT 29.6* 06/18/2013   PLT 181 06/18/2013   GLUCOSE 74 05/21/2013    ALKPHOS 63 05/21/2013   ALT 15 05/21/2013   AST 15 05/21/2013   NA 143 05/21/2013   K 4.7 05/21/2013   CL 113* 05/21/2013   CREATININE 1.7* 05/21/2013   BUN 31.8* 05/21/2013   CO2 23 05/21/2013   INR 1.04 05/15/2013     ASSESSMENT AND PLAN:   1. Hypertension: He is on amlodipine, lisinopril, metoprolol.  2. Hyperlipidemia: He is on simvastatin.  3. CAD: He is on ASA, simvastatin, lisinopril, and metoprolol.  4. Hypothyroidism: He is on Synthroid.  5. BPH: He is on dutasteride and terazosin.  6. Osteoporosis: He is on Fosamax.  7. 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  He has grade 1-2 fatigue.  I recommended to continue the current dose of chemo and repeat bone marrow biopsy around Sep-Oct 2014 since the last one was in May 2014.   8.  Pancytopenia:  Due to MDS/AML.  There is no active bleeding.  There is no indication for transfusion today.  Goal of transfusion Hgb <7 or 8; Plt <10K or active bleeding.  Will check his CBC every two weeks since he has not needed transfusion for a while.   9 . Code status: FULL CODE.  I informed Mr. Mashek that I am leaving the practice.  [facility] will arrange for him to see another provider when he returns.     The length of time of the face-to-face encounter was 25 minutes. More than 50% of time was spent counseling and coordination of care.

## 2013-06-18 NOTE — Telephone Encounter (Signed)
Per staff message and POF I have scheduled appts.  JMW  

## 2013-06-18 NOTE — Progress Notes (Signed)
Ok to treat with ANC 0.5 per Dr. Gaylyn Rong.

## 2013-06-18 NOTE — Telephone Encounter (Signed)
S.W. PT AND ADVISED THAT SCHED WAS COMPLETED AND TO PICK UP SCHED TOMORROW AT [facility]. VISIT  PT OK AND AWARE

## 2013-06-19 ENCOUNTER — Ambulatory Visit (HOSPITAL_BASED_OUTPATIENT_CLINIC_OR_DEPARTMENT_OTHER): Payer: Medicare Other

## 2013-06-19 ENCOUNTER — Telehealth: Payer: Self-pay | Admitting: Oncology

## 2013-06-19 VITALS — BP 110/70 | HR 66 | Temp 97.4°F | Resp 20

## 2013-06-19 DIAGNOSIS — Z5111 Encounter for antineoplastic chemotherapy: Secondary | ICD-10-CM

## 2013-06-19 DIAGNOSIS — D46Z Other myelodysplastic syndromes: Secondary | ICD-10-CM

## 2013-06-19 DIAGNOSIS — C9200 Acute myeloblastic leukemia, not having achieved remission: Secondary | ICD-10-CM

## 2013-06-19 MED ORDER — AZACITIDINE CHEMO SQ INJECTION
100.0000 mg/m2 | Freq: Once | INTRAMUSCULAR | Status: AC
  Administered 2013-06-19: 200 mg via SUBCUTANEOUS
  Filled 2013-06-19: qty 8

## 2013-06-19 MED ORDER — ONDANSETRON HCL 8 MG PO TABS
8.0000 mg | ORAL_TABLET | Freq: Once | ORAL | Status: AC
  Administered 2013-06-19: 8 mg via ORAL

## 2013-06-19 NOTE — Telephone Encounter (Signed)
Gave pt appt for June, July and August 2014, lab,MD and chemo

## 2013-06-19 NOTE — Patient Instructions (Addendum)
[facility] Discharge Instructions for Patients Receiving Chemotherapy  Today you received the following chemotherapy agents :  Vidaza.  To help prevent nausea and vomiting after your treatment, we encourage you to take your nausea medication as instructed by your physician.   If you develop nausea and vomiting that is not controlled by your nausea medication, call the clinic.   BELOW ARE SYMPTOMS THAT SHOULD BE REPORTED IMMEDIATELY:  *FEVER GREATER THAN 100.5 F  *CHILLS WITH OR WITHOUT FEVER  NAUSEA AND VOMITING THAT IS NOT CONTROLLED WITH YOUR NAUSEA MEDICATION  *UNUSUAL SHORTNESS OF BREATH  *UNUSUAL BRUISING OR BLEEDING  TENDERNESS IN MOUTH AND THROAT WITH OR WITHOUT PRESENCE OF ULCERS  *URINARY PROBLEMS  *BOWEL PROBLEMS  UNUSUAL RASH Items with * indicate a potential emergency and should be followed up as soon as possible.  Feel free to call the clinic you have any questions or concerns. The clinic phone number is [phone].    

## 2013-06-20 ENCOUNTER — Ambulatory Visit (HOSPITAL_BASED_OUTPATIENT_CLINIC_OR_DEPARTMENT_OTHER): Payer: Medicare Other

## 2013-06-20 VITALS — BP 110/61 | HR 97 | Temp 98.6°F

## 2013-06-20 DIAGNOSIS — D46Z Other myelodysplastic syndromes: Secondary | ICD-10-CM

## 2013-06-20 DIAGNOSIS — C9200 Acute myeloblastic leukemia, not having achieved remission: Secondary | ICD-10-CM

## 2013-06-20 DIAGNOSIS — Z5111 Encounter for antineoplastic chemotherapy: Secondary | ICD-10-CM

## 2013-06-20 MED ORDER — AZACITIDINE CHEMO SQ INJECTION
100.0000 mg/m2 | Freq: Once | INTRAMUSCULAR | Status: AC
  Administered 2013-06-20: 200 mg via SUBCUTANEOUS
  Filled 2013-06-20: qty 8

## 2013-06-20 MED ORDER — ONDANSETRON HCL 8 MG PO TABS
8.0000 mg | ORAL_TABLET | Freq: Once | ORAL | Status: AC
  Administered 2013-06-20: 8 mg via ORAL

## 2013-06-20 NOTE — Patient Instructions (Addendum)
[facility] Discharge Instructions for Patients Receiving Chemotherapy  Today you received the following chemotherapy agents Vidaza  To help prevent nausea and vomiting after your treatment, we encourage you to take your nausea medication    If you develop nausea and vomiting that is not controlled by your nausea medication, call the clinic.   BELOW ARE SYMPTOMS THAT SHOULD BE REPORTED IMMEDIATELY:  *FEVER GREATER THAN 100.5 F  *CHILLS WITH OR WITHOUT FEVER  NAUSEA AND VOMITING THAT IS NOT CONTROLLED WITH YOUR NAUSEA MEDICATION  *UNUSUAL SHORTNESS OF BREATH  *UNUSUAL BRUISING OR BLEEDING  TENDERNESS IN MOUTH AND THROAT WITH OR WITHOUT PRESENCE OF ULCERS  *URINARY PROBLEMS  *BOWEL PROBLEMS  UNUSUAL RASH Items with * indicate a potential emergency and should be followed up as soon as possible.  Feel free to call the clinic you have any questions or concerns. The clinic phone number is [phone].    

## 2013-06-23 ENCOUNTER — Ambulatory Visit (HOSPITAL_BASED_OUTPATIENT_CLINIC_OR_DEPARTMENT_OTHER): Payer: Medicare Other

## 2013-06-23 VITALS — BP 117/73 | HR 61 | Temp 97.3°F | Resp 20

## 2013-06-23 DIAGNOSIS — Z5111 Encounter for antineoplastic chemotherapy: Secondary | ICD-10-CM

## 2013-06-23 DIAGNOSIS — D46Z Other myelodysplastic syndromes: Secondary | ICD-10-CM

## 2013-06-23 DIAGNOSIS — C9200 Acute myeloblastic leukemia, not having achieved remission: Secondary | ICD-10-CM

## 2013-06-23 MED ORDER — ONDANSETRON HCL 8 MG PO TABS
8.0000 mg | ORAL_TABLET | Freq: Once | ORAL | Status: AC
  Administered 2013-06-23: 8 mg via ORAL

## 2013-06-23 MED ORDER — AZACITIDINE CHEMO SQ INJECTION
100.0000 mg/m2 | Freq: Once | INTRAMUSCULAR | Status: AC
  Administered 2013-06-23: 200 mg via SUBCUTANEOUS
  Filled 2013-06-23: qty 8

## 2013-06-23 NOTE — Patient Instructions (Signed)
[facility] Discharge Instructions for Patients Receiving Chemotherapy  Today you received the following chemotherapy agents :  Vidaza.  To help prevent nausea and vomiting after your treatment, we encourage you to take your nausea medication as instructed by your physician.   If you develop nausea and vomiting that is not controlled by your nausea medication, call the clinic.   BELOW ARE SYMPTOMS THAT SHOULD BE REPORTED IMMEDIATELY:  *FEVER GREATER THAN 100.5 F  *CHILLS WITH OR WITHOUT FEVER  NAUSEA AND VOMITING THAT IS NOT CONTROLLED WITH YOUR NAUSEA MEDICATION  *UNUSUAL SHORTNESS OF BREATH  *UNUSUAL BRUISING OR BLEEDING  TENDERNESS IN MOUTH AND THROAT WITH OR WITHOUT PRESENCE OF ULCERS  *URINARY PROBLEMS  *BOWEL PROBLEMS  UNUSUAL RASH Items with * indicate a potential emergency and should be followed up as soon as possible.  Feel free to call the clinic you have any questions or concerns. The clinic phone number is [phone].    

## 2013-06-24 ENCOUNTER — Ambulatory Visit (HOSPITAL_BASED_OUTPATIENT_CLINIC_OR_DEPARTMENT_OTHER): Payer: Medicare Other

## 2013-06-24 VITALS — BP 103/51 | HR 63 | Temp 98.7°F

## 2013-06-24 DIAGNOSIS — C9200 Acute myeloblastic leukemia, not having achieved remission: Secondary | ICD-10-CM

## 2013-06-24 DIAGNOSIS — D46Z Other myelodysplastic syndromes: Secondary | ICD-10-CM

## 2013-06-24 DIAGNOSIS — Z5111 Encounter for antineoplastic chemotherapy: Secondary | ICD-10-CM

## 2013-06-24 MED ORDER — ONDANSETRON HCL 8 MG PO TABS
8.0000 mg | ORAL_TABLET | Freq: Once | ORAL | Status: AC
  Administered 2013-06-24: 8 mg via ORAL

## 2013-06-24 MED ORDER — AZACITIDINE CHEMO SQ INJECTION
100.0000 mg/m2 | Freq: Once | INTRAMUSCULAR | Status: AC
  Administered 2013-06-24: 200 mg via SUBCUTANEOUS
  Filled 2013-06-24: qty 8

## 2013-06-24 NOTE — Patient Instructions (Addendum)
[facility] Discharge Instructions for Patients Receiving Chemotherapy  Today you received the following chemotherapy agents Vidaza. To help prevent nausea and vomiting after your treatment, we encourage you to take your nausea medication as prescribed.    If you develop nausea and vomiting that is not controlled by your nausea medication, call the clinic.   BELOW ARE SYMPTOMS THAT SHOULD BE REPORTED IMMEDIATELY:  *FEVER GREATER THAN 100.5 F  *CHILLS WITH OR WITHOUT FEVER  NAUSEA AND VOMITING THAT IS NOT CONTROLLED WITH YOUR NAUSEA MEDICATION  *UNUSUAL SHORTNESS OF BREATH  *UNUSUAL BRUISING OR BLEEDING  TENDERNESS IN MOUTH AND THROAT WITH OR WITHOUT PRESENCE OF ULCERS  *URINARY PROBLEMS  *BOWEL PROBLEMS  UNUSUAL RASH Items with * indicate a potential emergency and should be followed up as soon as possible.  Feel free to call the clinic you have any questions or concerns. The clinic phone number is [phone].    

## 2013-06-25 ENCOUNTER — Ambulatory Visit (HOSPITAL_BASED_OUTPATIENT_CLINIC_OR_DEPARTMENT_OTHER): Payer: Medicare Other

## 2013-06-25 VITALS — BP 111/64 | HR 68 | Temp 98.1°F | Resp 18

## 2013-06-25 DIAGNOSIS — Z5111 Encounter for antineoplastic chemotherapy: Secondary | ICD-10-CM

## 2013-06-25 DIAGNOSIS — C9200 Acute myeloblastic leukemia, not having achieved remission: Secondary | ICD-10-CM

## 2013-06-25 DIAGNOSIS — D46Z Other myelodysplastic syndromes: Secondary | ICD-10-CM

## 2013-06-25 MED ORDER — ONDANSETRON HCL 8 MG PO TABS
8.0000 mg | ORAL_TABLET | Freq: Once | ORAL | Status: AC
  Administered 2013-06-25: 8 mg via ORAL

## 2013-06-25 MED ORDER — AZACITIDINE CHEMO SQ INJECTION
100.0000 mg/m2 | Freq: Once | INTRAMUSCULAR | Status: AC
  Administered 2013-06-25: 200 mg via SUBCUTANEOUS
  Filled 2013-06-25: qty 8

## 2013-06-25 NOTE — Patient Instructions (Addendum)
[facility] Discharge Instructions for Patients Receiving Chemotherapy  Today you received the following chemotherapy agents :  Vidaza.  To help prevent nausea and vomiting after your treatment, we encourage you to take your nausea medication as instructed by your physician.   If you develop nausea and vomiting that is not controlled by your nausea medication, call the clinic.   BELOW ARE SYMPTOMS THAT SHOULD BE REPORTED IMMEDIATELY:  *FEVER GREATER THAN 100.5 F  *CHILLS WITH OR WITHOUT FEVER  NAUSEA AND VOMITING THAT IS NOT CONTROLLED WITH YOUR NAUSEA MEDICATION  *UNUSUAL SHORTNESS OF BREATH  *UNUSUAL BRUISING OR BLEEDING  TENDERNESS IN MOUTH AND THROAT WITH OR WITHOUT PRESENCE OF ULCERS  *URINARY PROBLEMS  *BOWEL PROBLEMS  UNUSUAL RASH Items with * indicate a potential emergency and should be followed up as soon as possible.  Feel free to call the clinic you have any questions or concerns. The clinic phone number is [phone].    

## 2013-06-26 ENCOUNTER — Encounter: Payer: Self-pay | Admitting: Oncology

## 2013-06-26 ENCOUNTER — Ambulatory Visit (HOSPITAL_BASED_OUTPATIENT_CLINIC_OR_DEPARTMENT_OTHER): Payer: Medicare Other

## 2013-06-26 VITALS — BP 108/56 | HR 68 | Temp 98.1°F | Resp 18

## 2013-06-26 DIAGNOSIS — C9200 Acute myeloblastic leukemia, not having achieved remission: Secondary | ICD-10-CM

## 2013-06-26 DIAGNOSIS — D46Z Other myelodysplastic syndromes: Secondary | ICD-10-CM

## 2013-06-26 DIAGNOSIS — Z5111 Encounter for antineoplastic chemotherapy: Secondary | ICD-10-CM

## 2013-06-26 MED ORDER — AZACITIDINE CHEMO SQ INJECTION
100.0000 mg/m2 | Freq: Once | INTRAMUSCULAR | Status: AC
  Administered 2013-06-26: 200 mg via SUBCUTANEOUS
  Filled 2013-06-26: qty 8

## 2013-06-26 MED ORDER — ONDANSETRON HCL 8 MG PO TABS
8.0000 mg | ORAL_TABLET | Freq: Once | ORAL | Status: AC
  Administered 2013-06-26: 8 mg via ORAL

## 2013-06-26 NOTE — Patient Instructions (Addendum)
[facility] Discharge Instructions for Patients Receiving Chemotherapy  Today you received the following chemotherapy agents Vidaza. To help prevent nausea and vomiting after your treatment, we encourage you to take your nausea medication as prescribed.    If you develop nausea and vomiting that is not controlled by your nausea medication, call the clinic.   BELOW ARE SYMPTOMS THAT SHOULD BE REPORTED IMMEDIATELY:  *FEVER GREATER THAN 100.5 F  *CHILLS WITH OR WITHOUT FEVER  NAUSEA AND VOMITING THAT IS NOT CONTROLLED WITH YOUR NAUSEA MEDICATION  *UNUSUAL SHORTNESS OF BREATH  *UNUSUAL BRUISING OR BLEEDING  TENDERNESS IN MOUTH AND THROAT WITH OR WITHOUT PRESENCE OF ULCERS  *URINARY PROBLEMS  *BOWEL PROBLEMS  UNUSUAL RASH Items with * indicate a potential emergency and should be followed up as soon as possible.  Feel free to call the clinic you have any questions or concerns. The clinic phone number is [phone].    

## 2013-07-02 ENCOUNTER — Other Ambulatory Visit (HOSPITAL_BASED_OUTPATIENT_CLINIC_OR_DEPARTMENT_OTHER): Payer: Medicare Other

## 2013-07-02 ENCOUNTER — Telehealth: Payer: Self-pay | Admitting: *Deleted

## 2013-07-02 DIAGNOSIS — C9200 Acute myeloblastic leukemia, not having achieved remission: Secondary | ICD-10-CM

## 2013-07-02 LAB — CBC WITH DIFFERENTIAL/PLATELET
BASO%: 4.5 % — ABNORMAL HIGH (ref 0.0–2.0)
Eosinophils Absolute: 0.1 10*3/uL (ref 0.0–0.5)
MONO#: 0.1 10*3/uL (ref 0.1–0.9)
NEUT#: 0.5 10*3/uL — CL (ref 1.5–6.5)
RBC: 3 10*6/uL — ABNORMAL LOW (ref 4.20–5.82)
RDW: 15.4 % — ABNORMAL HIGH (ref 11.0–14.6)
WBC: 1.9 10*3/uL — ABNORMAL LOW (ref 4.0–10.3)

## 2013-07-02 LAB — HOLD TUBE, BLOOD BANK

## 2013-07-02 NOTE — Telephone Encounter (Signed)
S/w pt in lobby and gave copy of labs,  Informed of no need for transfusion,  Return in 2 weeks as scheduled. He verbalized understanding,.

## 2013-07-02 NOTE — Telephone Encounter (Signed)
Message copied by Wende Mott on Wed Jul 02, 2013 10:59 AM ------      Message from: HA, Raliegh Ip T      Created: Wed Jul 02, 2013 10:22 AM       No need for transfusion today.  Thanks. ------

## 2013-07-18 ENCOUNTER — Telehealth: Payer: Self-pay | Admitting: *Deleted

## 2013-07-18 ENCOUNTER — Telehealth: Payer: Self-pay | Admitting: Hematology and Oncology

## 2013-07-18 NOTE — Telephone Encounter (Signed)
7/28 appt moved from CP1 to CP2 - HH pt. Per desk nurse moved to 9am for lb and 9:30am for CP2. Per desk nurse she s/w pt's son re new time.

## 2013-07-18 NOTE — Telephone Encounter (Signed)
Informed pt of need to r/s appt times on 7/28.    Will r/s appt from Dr. Karel Jarvis to Dr. Alecia Lemming schedule as Dr. Alecia Lemming is covering Dr. Lodema Pilot patients now.  Pt to arrive at 9 am for lab,  9:30 am for MD visit and 10:30 am for chemo.  S/w Dory Peru in scheduling and she will make changes in schedule.

## 2013-07-21 ENCOUNTER — Other Ambulatory Visit (HOSPITAL_BASED_OUTPATIENT_CLINIC_OR_DEPARTMENT_OTHER): Payer: Medicare Other | Admitting: Lab

## 2013-07-21 ENCOUNTER — Ambulatory Visit (HOSPITAL_BASED_OUTPATIENT_CLINIC_OR_DEPARTMENT_OTHER): Payer: Medicare Other | Admitting: Hematology and Oncology

## 2013-07-21 ENCOUNTER — Ambulatory Visit (HOSPITAL_BASED_OUTPATIENT_CLINIC_OR_DEPARTMENT_OTHER): Payer: Medicare Other

## 2013-07-21 VITALS — BP 111/62 | HR 81 | Temp 96.9°F | Resp 18 | Ht 68.0 in | Wt 178.8 lb

## 2013-07-21 DIAGNOSIS — C9200 Acute myeloblastic leukemia, not having achieved remission: Secondary | ICD-10-CM

## 2013-07-21 DIAGNOSIS — D469 Myelodysplastic syndrome, unspecified: Secondary | ICD-10-CM

## 2013-07-21 DIAGNOSIS — D46Z Other myelodysplastic syndromes: Secondary | ICD-10-CM

## 2013-07-21 LAB — COMPREHENSIVE METABOLIC PANEL (CC13)
ALT: 10 U/L (ref 0–55)
AST: 14 U/L (ref 5–34)
Albumin: 2.8 g/dL — ABNORMAL LOW (ref 3.5–5.0)
CO2: 23 mEq/L (ref 22–29)
Calcium: 9.4 mg/dL (ref 8.4–10.4)
Chloride: 112 mEq/L — ABNORMAL HIGH (ref 98–109)
Creatinine: 2.3 mg/dL — ABNORMAL HIGH (ref 0.7–1.3)
Potassium: 4.5 mEq/L (ref 3.5–5.1)

## 2013-07-21 LAB — CBC WITH DIFFERENTIAL/PLATELET
BASO%: 4.5 % — ABNORMAL HIGH (ref 0.0–2.0)
Basophils Absolute: 0.1 10*3/uL (ref 0.0–0.1)
HCT: 28.3 % — ABNORMAL LOW (ref 38.4–49.9)
HGB: 9.5 g/dL — ABNORMAL LOW (ref 13.0–17.1)
MCHC: 33.6 g/dL (ref 32.0–36.0)
MONO#: 0.1 10*3/uL (ref 0.1–0.9)
NEUT%: 27.1 % — ABNORMAL LOW (ref 39.0–75.0)
RDW: 15.7 % — ABNORMAL HIGH (ref 11.0–14.6)
WBC: 1.9 10*3/uL — ABNORMAL LOW (ref 4.0–10.3)
lymph#: 1.1 10*3/uL (ref 0.9–3.3)

## 2013-07-21 MED ORDER — AZACITIDINE CHEMO SQ INJECTION: 100.0000 mg/m2 | Freq: Once | INTRAMUSCULAR | Status: DC

## 2013-07-21 MED ORDER — ONDANSETRON HCL 8 MG PO TABS
8.0000 mg | ORAL_TABLET | Freq: Once | ORAL | Status: DC
  Administered 2013-07-21: 8 mg via ORAL

## 2013-07-21 NOTE — Progress Notes (Signed)
Previously told to treat; creatinine levels increased and treatment cancelled, per physician; patient is aware to reschedule in 1 week.

## 2013-07-21 NOTE — Progress Notes (Signed)
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   CURRENT THERAPY:  started on 11/11/2012 Vidaza  d1-5, d8-9; of every 4 weeks cycle.   INTERVAL HISTORY: Kyle Brewer 79 y.o. male returns for regular follow up with his son.  He reported feeling well.   He has mild fatigue. He is still able to live by himself, grocery shop, cook, clean the house.  He has chronic constipation for which he takes stool softener. His appetite not very good.    REVIEW OF SYSTEMS: ROS The patient denied fever, chills. He had night sweats (chest) but not every day., His appetite not good. He denied headaches, double vision, blurry vision, nasal congestion, nasal discharge,  Odynophagia. He reported dysphagia for big pills.Marland Kitchen His hearing diminished. Occasionally he felt lightheaded. No chest pain, palpitations, dyspnea, cough. He has mild abdominal pain secondary to abdominal hernia. He constipated when on treatment. No nausea, vomiting, diarrhea,hematochezia. The patient denied dysuria, nocturia, polyuria, hematuria, myalgia, numbness, tingling, psychiatric problems. He has pain in knee when he move around and pain in his low back. He also has sensitive toes on his left foot.  PAST MEDICAL HISTORY: Past Medical History  Diagnosis Date  . Inguinal hernia     right  . Osteoporosis   . Hypothyroid   . Hyperlipidemia   . Arthritis   . BPH (benign prostatic hyperplasia)   . Wound, open, elbow     Right, followed [facility]  . Hypertension   . CAD (coronary artery disease)     3V CABG 1995, relook cath 2001 with patent grafts. normal stress test 2010.   . Back pain   . CKD (chronic kidney disease), stage I   . Bursitis   . Pancytopenia   . Kidney stone   . 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 MDS (myelodysplastic syndrome), high grade     bone marrow biposy on  10/21/12 was normal; 02/21/12 showed trisomy 8; 05/15/2013 showed trisomy 8.     PAST SURGICAL HISTORY: Past Surgical History  Procedure Laterality Date  . Appendectomy  1968  . Coronary artery bypass graft  1995  . Total knee arthroplasty  2007    right  . Elbow arthroscopy  2012    right  . Transurethral resection of prostate      Dr. Isabel Caprice  . Hernia repair  11/07/11    lap bilateral inguinal    FAMILY HISTORY Family History  Problem Relation Age of Onset  . Cancer Father     lung  . Heart failure Mother   . Cancer Brother     lung    HEALTH MAINTENANCE: History  Substance Use Topics  . Smoking status: Former Smoker -- 0.50 packs/day for 10 years    Quit date: 12/26/1963  . Smokeless tobacco: Never Used     Comment: quit 1965  . Alcohol Use: No    No Known Allergies  Current Outpatient Prescriptions  Medication Sig Dispense Refill  . alendronate (FOSAMAX) 70 MG tablet Take 70 mg by mouth every Monday. Take with a full glass of water on an empty stomach.      Marland Kitchen amLODipine (NORVASC) 5 MG tablet Take 1 tablet (5 mg total) by mouth daily before breakfast.  90 tablet  3  . aspirin 81 MG tablet Take 81 mg by mouth daily.        Marland Kitchen  Calcium Carbonate-Vitamin D (CALCIUM 600 + D PO) Take 1 tablet by mouth 2 (two) times daily.      . finasteride (PROSCAR) 5 MG tablet Take 5 mg by mouth daily.      Marland Kitchen levothyroxine (SYNTHROID, LEVOTHROID) 50 MCG tablet Take 50 mcg by mouth every evening.       . lidocaine-prilocaine (EMLA) cream Apply topically as needed.  30 g  2  . lisinopril (PRINIVIL,ZESTRIL) 10 MG tablet Take 10 mg by mouth every evening.       Marland Kitchen LORazepam (ATIVAN) 0.5 MG tablet Take 0.5 mg by mouth at bedtime as needed for anxiety (sleep).       . meloxicam (MOBIC) 7.5 MG tablet Take 7.5 mg by mouth 2 (two) times daily.       . metoprolol succinate (TOPROL-XL) 100 MG 24 hr tablet Take 1 tablet (100 mg total) by mouth daily before breakfast. Take with or immediately  following a meal.  90 tablet  3  . Multiple Vitamin (MULTIVITAMIN WITH MINERALS) TABS Take 1 tablet by mouth daily.      . simvastatin (ZOCOR) 20 MG tablet Take 20 mg by mouth at bedtime.        Marland Kitchen terazosin (HYTRIN) 10 MG capsule Take 10 mg by mouth at bedtime.      . vitamin B-12 (CYANOCOBALAMIN) 1000 MCG tablet Take 1,000 mcg by mouth daily.      . vitamin C (ASCORBIC ACID) 500 MG tablet Take 500 mg by mouth daily.       No current facility-administered medications for this visit.    OBJECTIVE: Filed Vitals:   07/21/13 0916  BP: 111/62  Pulse: 81  Temp: 96.9 F (36.1 C)  Resp: 18     Body mass index is 27.19 kg/(m^2).    ECOG FS:2 HEENT: Sclerae anicteric.  Conjunctivae were pink. Pupils round and reactive bilaterally. Oral mucosa is moist without ulceration or thrush. No occipital, submandibular, cervical, supraclavicular or axillar adenopathy. Lungs: clear to auscultation without wheezes. No rales or rhonchi. Heart: regular rate and rhythm. No murmur, gallop or rubs. Abdomen: soft, non tender. No guarding or rebound tenderness. Bowel sounds are present. No palpable hepatosplenomegaly. MSK: no focal spinal tenderness. Extremities: No clubbing or cyanosis.No calf tenderness to palpitation, no peripheral edema. The patient had grossly intact strength in upper and lower extremities Neuro: non-focal, alert and oriented to time, person and place, appropriate affect  LAB RESULTS:  CMP     Component Value Date/Time   NA 144 07/21/2013 0856   NA 140 11/04/2012 1201   K 4.5 07/21/2013 0856   K 4.0 11/04/2012 1201   CL 111* 06/18/2013 0854   CL 107 11/04/2012 1201   CO2 23 07/21/2013 0856   CO2 26 11/04/2012 1201   GLUCOSE 86 07/21/2013 0856   GLUCOSE 117* 06/18/2013 0854   GLUCOSE 85 11/04/2012 1201   BUN 39.9* 07/21/2013 0856   BUN 26* 11/04/2012 1201   CREATININE 2.3* 07/21/2013 0856   CREATININE 1.56* 11/04/2012 1201   CALCIUM 9.4 07/21/2013 0856   CALCIUM 9.9 11/04/2012 1201    PROT 6.0* 07/21/2013 0856   ALBUMIN 2.8* 07/21/2013 0856   AST 14 07/21/2013 0856   ALT 10 07/21/2013 0856   ALKPHOS 57 07/21/2013 0856   BILITOT 0.33 07/21/2013 0856   GFRNONAA 39* 11/04/2012 1201   GFRAA 46* 11/04/2012 1201    Lab Results  Component Value Date   WBC 1.9* 07/21/2013   NEUTROABS 0.5* 07/21/2013  HGB 9.5* 07/21/2013   HCT 28.3* 07/21/2013   MCV 93.5 07/21/2013   PLT 358 07/21/2013      Chemistry      Component Value Date/Time   NA 144 07/21/2013 0856   NA 140 11/04/2012 1201   K 4.5 07/21/2013 0856   K 4.0 11/04/2012 1201   CL 111* 06/18/2013 0854   CL 107 11/04/2012 1201   CO2 23 07/21/2013 0856   CO2 26 11/04/2012 1201   BUN 39.9* 07/21/2013 0856   BUN 26* 11/04/2012 1201   CREATININE 2.3* 07/21/2013 0856   CREATININE 1.56* 11/04/2012 1201      Component Value Date/Time   CALCIUM 9.4 07/21/2013 0856   CALCIUM 9.9 11/04/2012 1201   ALKPHOS 57 07/21/2013 0856   AST 14 07/21/2013 0856   ALT 10 07/21/2013 0856   BILITOT 0.33 07/21/2013 0856      ASSESSMENT AND PLAN:   1. 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  He has grade 2 fatigue. His creatinine today 2.3 and I will hold chemotherapy for 1 week.  2.  Pancytopenia:  Due to MDS/AML.  There is no active bleeding.  There is no indication for transfusion today.  Goal of transfusion Hgb <7 or 8; Plt <10K or active bleeding.  Will check his CBC every four weeks since he has not needed transfusion for a while.  3. Hypertension: He is on amlodipine, lisinopril, metoprolol.  4. Hyperlipidemia: He is on simvastatin.  5. CAD: He is on ASA, simvastatin, lisinopril, and metoprolol.  6. Hypothyroidism: He is on Synthroid.  7. BPH: He is on dutasteride and terazosin.  8. Osteoporosis: He is on Fosamax.    9 . Code status: FULL CODE.   The length of time of the face-to-face encounter was 25 minutes. More than 50%  of time was spent counseling and coordination of care.    Myra Rude, MD   07/21/2013 6:22 PM

## 2013-07-22 ENCOUNTER — Ambulatory Visit: Payer: Medicare Other

## 2013-07-23 ENCOUNTER — Telehealth: Payer: Self-pay | Admitting: *Deleted

## 2013-07-23 ENCOUNTER — Other Ambulatory Visit: Payer: Self-pay | Admitting: Certified Registered Nurse Anesthetist

## 2013-07-23 ENCOUNTER — Ambulatory Visit: Payer: Medicare Other

## 2013-07-23 ENCOUNTER — Telehealth: Payer: Self-pay | Admitting: Hematology and Oncology

## 2013-07-23 NOTE — Telephone Encounter (Signed)
lvm for pt regarding to 8.4.14 and 8.6.14 appt.Marland KitchenMarland KitchenMarland Kitchen

## 2013-07-23 NOTE — Telephone Encounter (Signed)
Per scheduler I have adjusted 8/4 appt

## 2013-07-24 ENCOUNTER — Ambulatory Visit: Payer: Medicare Other

## 2013-07-25 ENCOUNTER — Ambulatory Visit: Payer: Medicare Other

## 2013-07-25 ENCOUNTER — Encounter: Payer: Self-pay | Admitting: Pharmacist

## 2013-07-28 ENCOUNTER — Other Ambulatory Visit (HOSPITAL_BASED_OUTPATIENT_CLINIC_OR_DEPARTMENT_OTHER): Payer: Medicare Other | Admitting: Lab

## 2013-07-28 ENCOUNTER — Ambulatory Visit (HOSPITAL_BASED_OUTPATIENT_CLINIC_OR_DEPARTMENT_OTHER): Payer: Medicare Other | Admitting: Hematology and Oncology

## 2013-07-28 ENCOUNTER — Ambulatory Visit (HOSPITAL_BASED_OUTPATIENT_CLINIC_OR_DEPARTMENT_OTHER): Payer: Medicare Other

## 2013-07-28 ENCOUNTER — Other Ambulatory Visit: Payer: Medicare Other | Admitting: Lab

## 2013-07-28 VITALS — BP 117/65 | HR 58 | Temp 97.9°F

## 2013-07-28 VITALS — BP 125/67 | HR 64 | Temp 97.6°F | Resp 20 | Ht 68.0 in | Wt 183.7 lb

## 2013-07-28 DIAGNOSIS — M810 Age-related osteoporosis without current pathological fracture: Secondary | ICD-10-CM

## 2013-07-28 DIAGNOSIS — C9200 Acute myeloblastic leukemia, not having achieved remission: Secondary | ICD-10-CM

## 2013-07-28 DIAGNOSIS — D46Z Other myelodysplastic syndromes: Secondary | ICD-10-CM

## 2013-07-28 DIAGNOSIS — D61818 Other pancytopenia: Secondary | ICD-10-CM

## 2013-07-28 DIAGNOSIS — D469 Myelodysplastic syndrome, unspecified: Secondary | ICD-10-CM

## 2013-07-28 DIAGNOSIS — Z5111 Encounter for antineoplastic chemotherapy: Secondary | ICD-10-CM

## 2013-07-28 LAB — CBC WITH DIFFERENTIAL/PLATELET
Basophils Absolute: 0.1 10*3/uL (ref 0.0–0.1)
EOS%: 2.6 % (ref 0.0–7.0)
Eosinophils Absolute: 0.1 10*3/uL (ref 0.0–0.5)
HGB: 9.7 g/dL — ABNORMAL LOW (ref 13.0–17.1)
MCH: 30 pg (ref 27.2–33.4)
MONO%: 14.1 % — ABNORMAL HIGH (ref 0.0–14.0)
NEUT#: 0.8 10*3/uL — ABNORMAL LOW (ref 1.5–6.5)
RBC: 3.23 10*6/uL — ABNORMAL LOW (ref 4.20–5.82)
RDW: 16.4 % — ABNORMAL HIGH (ref 11.0–14.6)
lymph#: 1.4 10*3/uL (ref 0.9–3.3)

## 2013-07-28 LAB — COMPREHENSIVE METABOLIC PANEL (CC13)
ALT: 15 U/L (ref 0–55)
AST: 16 U/L (ref 5–34)
Albumin: 3 g/dL — ABNORMAL LOW (ref 3.5–5.0)
Alkaline Phosphatase: 56 U/L (ref 40–150)
BUN: 34.4 mg/dL — ABNORMAL HIGH (ref 7.0–26.0)
Calcium: 8.6 mg/dL (ref 8.4–10.4)
Chloride: 113 mEq/L — ABNORMAL HIGH (ref 98–109)
Potassium: 4.5 mEq/L (ref 3.5–5.1)
Sodium: 143 mEq/L (ref 136–145)
Total Protein: 6.1 g/dL — ABNORMAL LOW (ref 6.4–8.3)

## 2013-07-28 MED ORDER — ONDANSETRON HCL 8 MG PO TABS
8.0000 mg | ORAL_TABLET | Freq: Once | ORAL | Status: AC
  Administered 2013-07-28: 8 mg via ORAL

## 2013-07-28 MED ORDER — AZACITIDINE CHEMO SQ INJECTION
100.0000 mg/m2 | Freq: Once | INTRAMUSCULAR | Status: AC
  Administered 2013-07-28: 200 mg via SUBCUTANEOUS
  Filled 2013-07-28: qty 8

## 2013-07-28 NOTE — Progress Notes (Signed)
Labs reviewed by MD, ok to treat despite counts. 

## 2013-07-28 NOTE — Progress Notes (Signed)
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   CURRENT THERAPY:  started on 11/11/2012 Vidaza  d1-5, d8-9; of every 4 weeks cycle.   INTERVAL HISTORY: Kyle Brewer 79 y.o. male returns for regular follow up.  He  reported that he had very good week.Marland Kitchen He tried to drink more water. The patient told that he had a little nasal congestion and  wax in ears, more on right. He had history of. dilatation of esophagus 3 years ago but reported occasional dysphagia when he swallow big pills. He has dyspnea with minimal exertion. He has history of constipation after treatment. Kyle Brewer continue to have pain in knees when move around. He.had right knee replacement 12 years ago.   REVIEW OF SYSTEMS: The patient denied fever, chills, night sweats, change in appetite or weight. Minimal nasal congestion. He denied headaches, double vision, blurry vision,  nasal discharge, hearing problems, odynophagia or dysphagia (except for big pills).. The patient has dyspnea with minimal exertion. No chest pain, palpitations,  cough, abdominal pain, nausea, vomiting, diarrhea, constipation, hematochezia. The patient denied dysuria, nocturia, polyuria, hematuria, myalgia, numbness, tingling, psychiatric problems.  PAST MEDICAL HISTORY: Past Medical History  Diagnosis Date  . Inguinal hernia     right  . Osteoporosis   . Hypothyroid   . Hyperlipidemia   . Arthritis   . BPH (benign prostatic hyperplasia)   . Wound, open, elbow     Right, followed [facility]  . Hypertension   . CAD (coronary artery disease)     3V CABG 1995, relook cath 2001 with patent grafts. normal stress test 2010.   . Back pain   . CKD (chronic kidney disease), stage I   . Bursitis   . Pancytopenia   . Kidney stone   . 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 MDS (myelodysplastic syndrome), high grade      bone marrow biposy on 10/21/12 was normal; 02/21/12 showed trisomy 8; 05/15/2013 showed trisomy 8.     PAST SURGICAL HISTORY: Past Surgical History  Procedure Laterality Date  . Appendectomy  1968  . Coronary artery bypass graft  1995  . Total knee arthroplasty  2007    right  . Elbow arthroscopy  2012    right  . Transurethral resection of prostate      Kyle Brewer  . Hernia repair  11/07/11    lap bilateral inguinal    FAMILY HISTORY Family History  Problem Relation Age of Onset  . Cancer Father     lung  . Heart failure Mother   . Cancer Brother     lung      HEALTH MAINTENANCE: History  Substance Use Topics  . Smoking status: Former Smoker -- 0.50 packs/day for 10 years    Quit date: 12/26/1963  . Smokeless tobacco: Never Used     Comment: quit 1965  . Alcohol Use: No    No Known Allergies  Current Outpatient Prescriptions  Medication Sig Dispense Refill  . alendronate (FOSAMAX) 70 MG tablet Take 70 mg by mouth every Monday. Take with a full glass of water on an empty stomach.      Marland Kitchen amLODipine (NORVASC) 5 MG tablet Take 1 tablet (5 mg total) by mouth daily before breakfast.  90 tablet  3  . aspirin 81 MG tablet Take 81 mg by mouth daily.        Marland Kitchen  Calcium Carbonate-Vitamin D (CALCIUM 600 + D PO) Take 1 tablet by mouth 2 (two) times daily.      . finasteride (PROSCAR) 5 MG tablet Take 5 mg by mouth daily.      Marland Kitchen levothyroxine (SYNTHROID, LEVOTHROID) 50 MCG tablet Take 50 mcg by mouth every evening.       . lidocaine-prilocaine (EMLA) cream Apply topically as needed.  30 g  2  . lisinopril (PRINIVIL,ZESTRIL) 10 MG tablet Take 10 mg by mouth every evening.       Marland Kitchen LORazepam (ATIVAN) 0.5 MG tablet Take 0.5 mg by mouth at bedtime as needed for anxiety (sleep).       . meloxicam (MOBIC) 7.5 MG tablet Take 7.5 mg by mouth 2 (two) times daily.       . metoprolol succinate (TOPROL-XL) 100 MG 24 hr tablet Take 1 tablet (100 mg total) by mouth daily before breakfast.  Take with or immediately following a meal.  90 tablet  3  . Multiple Vitamin (MULTIVITAMIN WITH MINERALS) TABS Take 1 tablet by mouth daily.      . simvastatin (ZOCOR) 20 MG tablet Take 20 mg by mouth at bedtime.        Marland Kitchen terazosin (HYTRIN) 10 MG capsule Take 10 mg by mouth at bedtime.      . vitamin B-12 (CYANOCOBALAMIN) 1000 MCG tablet Take 1,000 mcg by mouth daily.      . vitamin C (ASCORBIC ACID) 500 MG tablet Take 500 mg by mouth daily.       No current facility-administered medications for this visit.    OBJECTIVE: Filed Vitals:   07/28/13 0817  BP: 125/67  Pulse: 64  Temp: 97.6 F (36.4 C)  Resp: 20     Body mass index is 27.94 kg/(m^2).    EOG FBS: 0  HEENT: Sclerae anicteric.  Conjunctivae were pink. Pupils round and reactive bilaterally. Oral mucosa is moist without ulceration or thrush. No occipital, submandibular, cervical, supraclavicular or axillar adenopathy. Lungs: clear to auscultation without wheezes. No rales or rhonchi. Heart: regular rate and rhythm. 2/6 systolic murmur, No gallop or rubs. Abdomen: soft, non tender. No guarding or rebound tenderness. Bowel sounds are present. No palpable hepatosplenomegaly. MKS: no focal spinal tenderness. Extremities: No clubbing or cyanosis.No calf tenderness to palpitation. +1  peripheral edema. The patient had grossly intact strength in upper and lower extremities Neuro: non-focal, alert and oriented to time, person and place, appropriate affect  LAB RESULTS:  CMP     Component Value Date/Time   NA 144 07/21/2013 0856   NA 140 11/04/2012 1201   K 4.5 07/21/2013 0856   K 4.0 11/04/2012 1201   CL 111* 06/18/2013 0854   CL 107 11/04/2012 1201   CO2 23 07/21/2013 0856   CO2 26 11/04/2012 1201   GLUCOSE 86 07/21/2013 0856   GLUCOSE 117* 06/18/2013 0854   GLUCOSE 85 11/04/2012 1201   BUN 39.9* 07/21/2013 0856   BUN 26* 11/04/2012 1201   CREATININE 2.3* 07/21/2013 0856   CREATININE 1.56* 11/04/2012 1201   CALCIUM 9.4  07/21/2013 0856   CALCIUM 9.9 11/04/2012 1201   PROT 6.0* 07/21/2013 0856   ALBUMIN 2.8* 07/21/2013 0856   AST 14 07/21/2013 0856   ALT 10 07/21/2013 0856   ALKPHOS 57 07/21/2013 0856   BILITOT 0.33 07/21/2013 0856   GFRNONAA 39* 11/04/2012 1201   GFRAA 46* 11/04/2012 1201    Lab Results  Component Value Date   WBC 2.7* 07/28/2013  NEUTROABS 0.8* 07/28/2013   HGB 9.7* 07/28/2013   HCT 29.9* 07/28/2013   MCV 92.4 07/28/2013   PLT 303 07/28/2013      Chemistry      Component Value Date/Time   NA 144 07/21/2013 0856   NA 140 11/04/2012 1201   K 4.5 07/21/2013 0856   K 4.0 11/04/2012 1201   CL 111* 06/18/2013 0854   CL 107 11/04/2012 1201   CO2 23 07/21/2013 0856   CO2 26 11/04/2012 1201   BUN 39.9* 07/21/2013 0856   BUN 26* 11/04/2012 1201   CREATININE 2.3* 07/21/2013 0856   CREATININE 1.56* 11/04/2012 1201      Component Value Date/Time   CALCIUM 9.4 07/21/2013 0856   CALCIUM 9.9 11/04/2012 1201   ALKPHOS 57 07/21/2013 0856   AST 14 07/21/2013 0856   ALT 10 07/21/2013 0856   BILITOT 0.33 07/21/2013 0856      ASSESSMENT AND PLAN:   1. 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 We will proceed with next cycle of Vidaza today. 2.  Pancytopenia which is secondary to MDS/AML.  There is no active bleeding.  There is no indication for transfusion today.  Goal of transfusion Hgb <7 or 8; Plt <10K or active bleeding.  I will check his CBC every four weeks.  3. Hypertension: He is on amlodipine, lisinopril, metoprolol.  4. Hyperlipidemia: He is on simvastatin.  5. CAD: He is on ASA, simvastatin, lisinopril, and metoprolol.  6. Hypothyroidism: He is on Synthroid.  7. BPH: He is on dutasteride and terazosin.  8. Osteoporosis: He is on Fosamax.  9. Follow up in 4 weeks with CBC and CMET  10 . Code status: FULL CODE.   The length of time of the face-to-face encounter was 15 minutes. More  than 50% of time was spent counseling and coordination of care.    Myra Rude, MD   07/21/2013 6:22 PM      Myra Rude, MD   07/28/2013 8:54 AM

## 2013-07-28 NOTE — Patient Instructions (Signed)
Azacitidine suspension for injection (subcutaneous use) What is this medicine? AZACITIDINE (ay za SITE i deen) is a chemotherapy drug. This medicine reduces the growth of cancer cells and can suppress the immune system. It is used for treating myelodysplastic syndrome or some types of leukemia. This medicine may be used for other purposes; ask your health care provider or pharmacist if you have questions. What should I tell my health care provider before I take this medicine? They need to know if you have any of these conditions: -infection (especially a virus infection such as chickenpox, cold sores, or herpes) -kidney disease -liver disease -liver tumors -an unusual or allergic reaction to azacitidine, mannitol, other medicines, foods, dyes, or preservatives -pregnant or trying to get pregnant -breast-feeding How should I use this medicine? This medicine is for injection under the skin. It is administered in a hospital or clinic by a specially trained health care professional. Talk to your pediatrician regarding the use of this medicine in children. While this drug may be prescribed for selected conditions, precautions do apply. Overdosage: If you think you have taken too much of this medicine contact a poison control center or emergency room at once. NOTE: This medicine is only for you. Do not share this medicine with others. What if I miss a dose? It is important not to miss your dose. Call your doctor or health care professional if you are unable to keep an appointment. What may interact with this medicine? -vaccines Talk to your doctor or health care professional before taking any of these medicines: -acetaminophen -aspirin -ibuprofen -ketoprofen -naproxen This list may not describe all possible interactions. Give your health care provider a list of all the medicines, herbs, non-prescription drugs, or dietary supplements you use. Also tell them if you smoke, drink alcohol, or use  illegal drugs. Some items may interact with your medicine. What should I watch for while using this medicine? Visit your doctor for checks on your progress. This drug may make you feel generally unwell. This is not uncommon, as chemotherapy can affect healthy cells as well as cancer cells. Report any side effects. Continue your course of treatment even though you feel ill unless your doctor tells you to stop. In some cases, you may be given additional medicines to help with side effects. Follow all directions for their use. Call your doctor or health care professional for advice if you get a fever, chills or sore throat, or other symptoms of a cold or flu. Do not treat yourself. This drug decreases your body's ability to fight infections. Try to avoid being around people who are sick. This medicine may increase your risk to bruise or bleed. Call your doctor or health care professional if you notice any unusual bleeding. Be careful brushing and flossing your teeth or using a toothpick because you may get an infection or bleed more easily. If you have any dental work done, tell your dentist you are receiving this medicine. Avoid taking products that contain aspirin, acetaminophen, ibuprofen, naproxen, or ketoprofen unless instructed by your doctor. These medicines may hide a fever. Do not have any vaccinations without your doctor's approval and avoid anyone who has recently had oral polio vaccine. Do not become pregnant while taking this medicine. Women should inform their doctor if they wish to become pregnant or think they might be pregnant. There is a potential for serious side effects to an unborn child. Talk to your health care professional or pharmacist for more information. Do not breast-feed an  infant while taking this medicine. If you are a man, you should not father a child while receiving treatment. What side effects may I notice from receiving this medicine? Side effects that you should report  to your doctor or health care professional as soon as possible: -allergic reactions like skin rash, itching or hives, swelling of the face, lips, or tongue -low blood counts - this medicine may decrease the number of white blood cells, red blood cells and platelets. You may be at increased risk for infections and bleeding. -signs of infection - fever or chills, cough, sore throat, pain or difficulty passing urine -signs of decreased platelets or bleeding - bruising, pinpoint red spots on the skin, black, tarry stools, blood in the urine -signs of decreased red blood cells - unusually weak or tired, fainting spells, lightheadedness -reactions at the injection site including redness, pain, itching, or bruising -breathing problems -changes in vision -fever -mouth sores -stomach pain -vomiting Side effects that usually do not require medical attention (report to your doctor or health care professional if they continue or are bothersome): -constipation -diarrhea -loss of appetite -nausea -pain or redness at the injection site -weak or tired This list may not describe all possible side effects. Call your doctor for medical advice about side effects. You may report side effects to FDA at 1-800-FDA-1088. Where should I keep my medicine? This drug is given in a hospital or clinic and will not be stored at home. NOTE: This sheet is a summary. It may not cover all possible information. If you have questions about this medicine, talk to your doctor, pharmacist, or health care provider.  2013, [employer]. (03/05/2008 11:04:07 AM)

## 2013-07-29 ENCOUNTER — Ambulatory Visit (HOSPITAL_BASED_OUTPATIENT_CLINIC_OR_DEPARTMENT_OTHER): Payer: Medicare Other

## 2013-07-29 ENCOUNTER — Telehealth: Payer: Self-pay | Admitting: Hematology and Oncology

## 2013-07-29 ENCOUNTER — Ambulatory Visit: Payer: Medicare Other

## 2013-07-29 VITALS — BP 122/69 | HR 58 | Temp 98.2°F | Resp 20

## 2013-07-29 DIAGNOSIS — D46Z Other myelodysplastic syndromes: Secondary | ICD-10-CM

## 2013-07-29 DIAGNOSIS — Z5111 Encounter for antineoplastic chemotherapy: Secondary | ICD-10-CM

## 2013-07-29 DIAGNOSIS — C9200 Acute myeloblastic leukemia, not having achieved remission: Secondary | ICD-10-CM

## 2013-07-29 MED ORDER — ONDANSETRON HCL 8 MG PO TABS
8.0000 mg | ORAL_TABLET | Freq: Once | ORAL | Status: AC
  Administered 2013-07-29: 8 mg via ORAL

## 2013-07-29 MED ORDER — AZACITIDINE CHEMO SQ INJECTION
100.0000 mg/m2 | Freq: Once | INTRAMUSCULAR | Status: AC
  Administered 2013-07-29: 200 mg via SUBCUTANEOUS
  Filled 2013-07-29: qty 8

## 2013-07-29 NOTE — Telephone Encounter (Signed)
Added f/u and next cycle of chemo starting 9/2. S/w pt today and pt will get new schedule when he comes in tomorrow (8/6).

## 2013-07-29 NOTE — Patient Instructions (Signed)
[facility] Discharge Instructions for Patients Receiving Chemotherapy  Today you received the following chemotherapy agents VIDAZA SQ  To help prevent nausea and vomiting after your treatment, we encourage you to take your nausea medication IF NEEDED   If you develop nausea and vomiting that is not controlled by your nausea medication, call the clinic.   BELOW ARE SYMPTOMS THAT SHOULD BE REPORTED IMMEDIATELY:  *FEVER GREATER THAN 100.5 F  *CHILLS WITH OR WITHOUT FEVER  NAUSEA AND VOMITING THAT IS NOT CONTROLLED WITH YOUR NAUSEA MEDICATION  *UNUSUAL SHORTNESS OF BREATH  *UNUSUAL BRUISING OR BLEEDING  TENDERNESS IN MOUTH AND THROAT WITH OR WITHOUT PRESENCE OF ULCERS  *URINARY PROBLEMS  *BOWEL PROBLEMS  UNUSUAL RASH Items with * indicate a potential emergency and should be followed up as soon as possible.  Feel free to call the clinic you have any questions or concerns. The clinic phone number is [phone].    

## 2013-07-30 ENCOUNTER — Ambulatory Visit (HOSPITAL_BASED_OUTPATIENT_CLINIC_OR_DEPARTMENT_OTHER): Payer: Medicare Other

## 2013-07-30 ENCOUNTER — Other Ambulatory Visit: Payer: Self-pay

## 2013-07-30 VITALS — BP 92/55 | HR 65 | Temp 98.2°F

## 2013-07-30 DIAGNOSIS — Z5111 Encounter for antineoplastic chemotherapy: Secondary | ICD-10-CM

## 2013-07-30 DIAGNOSIS — C9200 Acute myeloblastic leukemia, not having achieved remission: Secondary | ICD-10-CM

## 2013-07-30 DIAGNOSIS — D46Z Other myelodysplastic syndromes: Secondary | ICD-10-CM

## 2013-07-30 MED ORDER — ONDANSETRON HCL 8 MG PO TABS
8.0000 mg | ORAL_TABLET | Freq: Once | ORAL | Status: AC
  Administered 2013-07-30: 8 mg via ORAL

## 2013-07-30 MED ORDER — AZACITIDINE CHEMO SQ INJECTION
100.0000 mg/m2 | Freq: Once | INTRAMUSCULAR | Status: AC
  Administered 2013-07-30: 200 mg via SUBCUTANEOUS
  Filled 2013-07-30: qty 8

## 2013-07-30 NOTE — Patient Instructions (Signed)
[facility] Discharge Instructions for Patients Receiving Chemotherapy  Today you received the following chemotherapy agents Vidaza. To help prevent nausea and vomiting after your treatment, we encourage you to take your nausea medication as prescribed.    If you develop nausea and vomiting that is not controlled by your nausea medication, call the clinic.   BELOW ARE SYMPTOMS THAT SHOULD BE REPORTED IMMEDIATELY:  *FEVER GREATER THAN 100.5 F  *CHILLS WITH OR WITHOUT FEVER  NAUSEA AND VOMITING THAT IS NOT CONTROLLED WITH YOUR NAUSEA MEDICATION  *UNUSUAL SHORTNESS OF BREATH  *UNUSUAL BRUISING OR BLEEDING  TENDERNESS IN MOUTH AND THROAT WITH OR WITHOUT PRESENCE OF ULCERS  *URINARY PROBLEMS  *BOWEL PROBLEMS  UNUSUAL RASH Items with * indicate a potential emergency and should be followed up as soon as possible.  Feel free to call the clinic you have any questions or concerns. The clinic phone number is [phone].    

## 2013-07-31 ENCOUNTER — Ambulatory Visit (HOSPITAL_BASED_OUTPATIENT_CLINIC_OR_DEPARTMENT_OTHER): Payer: Medicare Other

## 2013-07-31 VITALS — BP 105/60 | HR 69 | Temp 97.3°F

## 2013-07-31 DIAGNOSIS — Z5111 Encounter for antineoplastic chemotherapy: Secondary | ICD-10-CM

## 2013-07-31 DIAGNOSIS — D46Z Other myelodysplastic syndromes: Secondary | ICD-10-CM

## 2013-07-31 DIAGNOSIS — C9200 Acute myeloblastic leukemia, not having achieved remission: Secondary | ICD-10-CM

## 2013-07-31 MED ORDER — AZACITIDINE CHEMO SQ INJECTION
100.0000 mg/m2 | Freq: Once | INTRAMUSCULAR | Status: AC
  Administered 2013-07-31: 200 mg via SUBCUTANEOUS
  Filled 2013-07-31: qty 8

## 2013-07-31 MED ORDER — ONDANSETRON HCL 8 MG PO TABS
8.0000 mg | ORAL_TABLET | Freq: Once | ORAL | Status: AC
  Administered 2013-07-31: 8 mg via ORAL

## 2013-07-31 NOTE — Patient Instructions (Addendum)
Azacitidine suspension for injection (subcutaneous use) What is this medicine? AZACITIDINE (ay za SITE i deen) is a chemotherapy drug. This medicine reduces the growth of cancer cells and can suppress the immune system. It is used for treating myelodysplastic syndrome or some types of leukemia. This medicine may be used for other purposes; ask your health care provider or pharmacist if you have questions. What should I tell my health care provider before I take this medicine? They need to know if you have any of these conditions: -infection (especially a virus infection such as chickenpox, cold sores, or herpes) -kidney disease -liver disease -liver tumors -an unusual or allergic reaction to azacitidine, mannitol, other medicines, foods, dyes, or preservatives -pregnant or trying to get pregnant -breast-feeding How should I use this medicine? This medicine is for injection under the skin. It is administered in a hospital or clinic by a specially trained health care professional. Talk to your pediatrician regarding the use of this medicine in children. While this drug may be prescribed for selected conditions, precautions do apply. Overdosage: If you think you have taken too much of this medicine contact a poison control center or emergency room at once. NOTE: This medicine is only for you. Do not share this medicine with others. What if I miss a dose? It is important not to miss your dose. Call your doctor or health care professional if you are unable to keep an appointment. What may interact with this medicine? -vaccines Talk to your doctor or health care professional before taking any of these medicines: -acetaminophen -aspirin -ibuprofen -ketoprofen -naproxen This list may not describe all possible interactions. Give your health care provider a list of all the medicines, herbs, non-prescription drugs, or dietary supplements you use. Also tell them if you smoke, drink alcohol, or use  illegal drugs. Some items may interact with your medicine. What should I watch for while using this medicine? Visit your doctor for checks on your progress. This drug may make you feel generally unwell. This is not uncommon, as chemotherapy can affect healthy cells as well as cancer cells. Report any side effects. Continue your course of treatment even though you feel ill unless your doctor tells you to stop. In some cases, you may be given additional medicines to help with side effects. Follow all directions for their use. Call your doctor or health care professional for advice if you get a fever, chills or sore throat, or other symptoms of a cold or flu. Do not treat yourself. This drug decreases your body's ability to fight infections. Try to avoid being around people who are sick. This medicine may increase your risk to bruise or bleed. Call your doctor or health care professional if you notice any unusual bleeding. Be careful brushing and flossing your teeth or using a toothpick because you may get an infection or bleed more easily. If you have any dental work done, tell your dentist you are receiving this medicine. Avoid taking products that contain aspirin, acetaminophen, ibuprofen, naproxen, or ketoprofen unless instructed by your doctor. These medicines may hide a fever. Do not have any vaccinations without your doctor's approval and avoid anyone who has recently had oral polio vaccine. Do not become pregnant while taking this medicine. Women should inform their doctor if they wish to become pregnant or think they might be pregnant. There is a potential for serious side effects to an unborn child. Talk to your health care professional or pharmacist for more information. Do not breast-feed an   infant while taking this medicine. If you are a man, you should not father a child while receiving treatment. What side effects may I notice from receiving this medicine? Side effects that you should report  to your doctor or health care professional as soon as possible: -allergic reactions like skin rash, itching or hives, swelling of the face, lips, or tongue -low blood counts - this medicine may decrease the number of white blood cells, red blood cells and platelets. You may be at increased risk for infections and bleeding. -signs of infection - fever or chills, cough, sore throat, pain or difficulty passing urine -signs of decreased platelets or bleeding - bruising, pinpoint red spots on the skin, black, tarry stools, blood in the urine -signs of decreased red blood cells - unusually weak or tired, fainting spells, lightheadedness -reactions at the injection site including redness, pain, itching, or bruising -breathing problems -changes in vision -fever -mouth sores -stomach pain -vomiting Side effects that usually do not require medical attention (report to your doctor or health care professional if they continue or are bothersome): -constipation -diarrhea -loss of appetite -nausea -pain or redness at the injection site -weak or tired This list may not describe all possible side effects. Call your doctor for medical advice about side effects. You may report side effects to FDA at 1-800-FDA-1088. Where should I keep my medicine? This drug is given in a hospital or clinic and will not be stored at home. NOTE: This sheet is a summary. It may not cover all possible information. If you have questions about this medicine, talk to your doctor, pharmacist, or health care provider.  2013, [employer]. (03/05/2008 11:04:07 AM)  

## 2013-07-31 NOTE — Progress Notes (Signed)
vidaza given in 4 syringes

## 2013-08-01 ENCOUNTER — Ambulatory Visit (HOSPITAL_BASED_OUTPATIENT_CLINIC_OR_DEPARTMENT_OTHER): Payer: Medicare Other

## 2013-08-01 VITALS — BP 127/72 | HR 63 | Temp 98.0°F | Resp 18

## 2013-08-01 DIAGNOSIS — C9200 Acute myeloblastic leukemia, not having achieved remission: Secondary | ICD-10-CM

## 2013-08-01 DIAGNOSIS — D46Z Other myelodysplastic syndromes: Secondary | ICD-10-CM

## 2013-08-01 DIAGNOSIS — Z5111 Encounter for antineoplastic chemotherapy: Secondary | ICD-10-CM

## 2013-08-01 MED ORDER — AZACITIDINE CHEMO SQ INJECTION
100.0000 mg/m2 | Freq: Once | INTRAMUSCULAR | Status: AC
  Administered 2013-08-01: 200 mg via SUBCUTANEOUS
  Filled 2013-08-01: qty 8

## 2013-08-01 MED ORDER — ONDANSETRON HCL 8 MG PO TABS
8.0000 mg | ORAL_TABLET | Freq: Once | ORAL | Status: AC
  Administered 2013-08-01: 8 mg via ORAL

## 2013-08-01 NOTE — Patient Instructions (Signed)
[facility] Discharge Instructions for Patients Receiving Chemotherapy  Today you received the following chemotherapy agents Vidaza  To help prevent nausea and vomiting after your treatment, we encourage you to take your nausea medication    If you develop nausea and vomiting that is not controlled by your nausea medication, call the clinic.   BELOW ARE SYMPTOMS THAT SHOULD BE REPORTED IMMEDIATELY:  *FEVER GREATER THAN 100.5 F  *CHILLS WITH OR WITHOUT FEVER  NAUSEA AND VOMITING THAT IS NOT CONTROLLED WITH YOUR NAUSEA MEDICATION  *UNUSUAL SHORTNESS OF BREATH  *UNUSUAL BRUISING OR BLEEDING  TENDERNESS IN MOUTH AND THROAT WITH OR WITHOUT PRESENCE OF ULCERS  *URINARY PROBLEMS  *BOWEL PROBLEMS  UNUSUAL RASH Items with * indicate a potential emergency and should be followed up as soon as possible.  Feel free to call the clinic you have any questions or concerns. The clinic phone number is [phone].    

## 2013-08-01 NOTE — Progress Notes (Signed)
vidaza given left abdomen x 4 injections.

## 2013-08-04 ENCOUNTER — Ambulatory Visit (HOSPITAL_BASED_OUTPATIENT_CLINIC_OR_DEPARTMENT_OTHER): Payer: Medicare Other

## 2013-08-04 VITALS — BP 104/67 | HR 66 | Temp 97.4°F | Resp 20

## 2013-08-04 DIAGNOSIS — C9200 Acute myeloblastic leukemia, not having achieved remission: Secondary | ICD-10-CM

## 2013-08-04 DIAGNOSIS — D46Z Other myelodysplastic syndromes: Secondary | ICD-10-CM

## 2013-08-04 DIAGNOSIS — Z5111 Encounter for antineoplastic chemotherapy: Secondary | ICD-10-CM

## 2013-08-04 MED ORDER — AZACITIDINE CHEMO SQ INJECTION
100.0000 mg/m2 | Freq: Once | INTRAMUSCULAR | Status: AC
  Administered 2013-08-04: 200 mg via SUBCUTANEOUS
  Filled 2013-08-04: qty 8

## 2013-08-04 MED ORDER — ONDANSETRON HCL 8 MG PO TABS
8.0000 mg | ORAL_TABLET | Freq: Once | ORAL | Status: AC
  Administered 2013-08-04: 8 mg via ORAL

## 2013-08-04 NOTE — Patient Instructions (Addendum)
[facility] Discharge Instructions for Patients Receiving Chemotherapy  Today you received the following chemotherapy agents :  Vidaza.  To help prevent nausea and vomiting after your treatment, we encourage you to take your nausea medication as instructed by your physician.   If you develop nausea and vomiting that is not controlled by your nausea medication, call the clinic.   BELOW ARE SYMPTOMS THAT SHOULD BE REPORTED IMMEDIATELY:  *FEVER GREATER THAN 100.5 F  *CHILLS WITH OR WITHOUT FEVER  NAUSEA AND VOMITING THAT IS NOT CONTROLLED WITH YOUR NAUSEA MEDICATION  *UNUSUAL SHORTNESS OF BREATH  *UNUSUAL BRUISING OR BLEEDING  TENDERNESS IN MOUTH AND THROAT WITH OR WITHOUT PRESENCE OF ULCERS  *URINARY PROBLEMS  *BOWEL PROBLEMS  UNUSUAL RASH Items with * indicate a potential emergency and should be followed up as soon as possible.  Feel free to call the clinic you have any questions or concerns. The clinic phone number is [phone].    

## 2013-08-05 ENCOUNTER — Ambulatory Visit (HOSPITAL_BASED_OUTPATIENT_CLINIC_OR_DEPARTMENT_OTHER): Payer: Medicare Other

## 2013-08-05 VITALS — BP 128/62 | HR 64 | Temp 98.6°F | Resp 20

## 2013-08-05 DIAGNOSIS — C9200 Acute myeloblastic leukemia, not having achieved remission: Secondary | ICD-10-CM

## 2013-08-05 DIAGNOSIS — Z5111 Encounter for antineoplastic chemotherapy: Secondary | ICD-10-CM

## 2013-08-05 DIAGNOSIS — D46Z Other myelodysplastic syndromes: Secondary | ICD-10-CM

## 2013-08-05 MED ORDER — ONDANSETRON HCL 8 MG PO TABS
8.0000 mg | ORAL_TABLET | Freq: Once | ORAL | Status: AC
  Administered 2013-08-05: 8 mg via ORAL

## 2013-08-05 MED ORDER — AZACITIDINE CHEMO SQ INJECTION
100.0000 mg/m2 | Freq: Once | INTRAMUSCULAR | Status: AC
  Administered 2013-08-05: 200 mg via SUBCUTANEOUS
  Filled 2013-08-05: qty 8

## 2013-08-05 NOTE — Patient Instructions (Addendum)
[facility] Discharge Instructions for Patients Receiving Chemotherapy  Today you received the following chemotherapy agents :  Vidaza.  To help prevent nausea and vomiting after your treatment, we encourage you to take your nausea medication as instructed by your physician.   If you develop nausea and vomiting that is not controlled by your nausea medication, call the clinic.   BELOW ARE SYMPTOMS THAT SHOULD BE REPORTED IMMEDIATELY:  *FEVER GREATER THAN 100.5 F  *CHILLS WITH OR WITHOUT FEVER  NAUSEA AND VOMITING THAT IS NOT CONTROLLED WITH YOUR NAUSEA MEDICATION  *UNUSUAL SHORTNESS OF BREATH  *UNUSUAL BRUISING OR BLEEDING  TENDERNESS IN MOUTH AND THROAT WITH OR WITHOUT PRESENCE OF ULCERS  *URINARY PROBLEMS  *BOWEL PROBLEMS  UNUSUAL RASH Items with * indicate a potential emergency and should be followed up as soon as possible.  Feel free to call the clinic you have any questions or concerns. The clinic phone number is [phone].    

## 2013-08-15 ENCOUNTER — Telehealth: Payer: Self-pay | Admitting: Dietician

## 2013-08-26 ENCOUNTER — Telehealth: Payer: Self-pay | Admitting: Hematology and Oncology

## 2013-08-26 ENCOUNTER — Ambulatory Visit (HOSPITAL_BASED_OUTPATIENT_CLINIC_OR_DEPARTMENT_OTHER): Payer: Medicare Other

## 2013-08-26 ENCOUNTER — Ambulatory Visit (HOSPITAL_BASED_OUTPATIENT_CLINIC_OR_DEPARTMENT_OTHER): Payer: Medicare Other | Admitting: Hematology and Oncology

## 2013-08-26 ENCOUNTER — Ambulatory Visit (HOSPITAL_BASED_OUTPATIENT_CLINIC_OR_DEPARTMENT_OTHER): Payer: Medicare Other | Admitting: Lab

## 2013-08-26 ENCOUNTER — Telehealth: Payer: Self-pay | Admitting: *Deleted

## 2013-08-26 VITALS — BP 123/67 | HR 70 | Temp 98.0°F | Resp 18 | Ht 68.0 in | Wt 182.1 lb

## 2013-08-26 DIAGNOSIS — C9200 Acute myeloblastic leukemia, not having achieved remission: Secondary | ICD-10-CM

## 2013-08-26 DIAGNOSIS — D649 Anemia, unspecified: Secondary | ICD-10-CM

## 2013-08-26 DIAGNOSIS — N181 Chronic kidney disease, stage 1: Secondary | ICD-10-CM

## 2013-08-26 DIAGNOSIS — D46Z Other myelodysplastic syndromes: Secondary | ICD-10-CM

## 2013-08-26 DIAGNOSIS — Z5111 Encounter for antineoplastic chemotherapy: Secondary | ICD-10-CM

## 2013-08-26 DIAGNOSIS — D469 Myelodysplastic syndrome, unspecified: Secondary | ICD-10-CM

## 2013-08-26 DIAGNOSIS — D6959 Other secondary thrombocytopenia: Secondary | ICD-10-CM

## 2013-08-26 DIAGNOSIS — D638 Anemia in other chronic diseases classified elsewhere: Secondary | ICD-10-CM

## 2013-08-26 LAB — COMPREHENSIVE METABOLIC PANEL (CC13)
Albumin: 3.3 g/dL — ABNORMAL LOW (ref 3.5–5.0)
Alkaline Phosphatase: 53 U/L (ref 40–150)
BUN: 25.2 mg/dL (ref 7.0–26.0)
Creatinine: 1.7 mg/dL — ABNORMAL HIGH (ref 0.7–1.3)
Glucose: 61 mg/dl — ABNORMAL LOW (ref 70–140)
Total Bilirubin: 0.52 mg/dL (ref 0.20–1.20)

## 2013-08-26 LAB — CBC WITH DIFFERENTIAL/PLATELET
Basophils Absolute: 0 10*3/uL (ref 0.0–0.1)
EOS%: 2 % (ref 0.0–7.0)
Eosinophils Absolute: 0.1 10*3/uL (ref 0.0–0.5)
HGB: 11.2 g/dL — ABNORMAL LOW (ref 13.0–17.1)
LYMPH%: 30.7 % (ref 14.0–49.0)
MCH: 30.8 pg (ref 27.2–33.4)
MCV: 92.7 fL (ref 79.3–98.0)
MONO%: 8.5 % (ref 0.0–14.0)
NEUT#: 2.5 10*3/uL (ref 1.5–6.5)
NEUT%: 58.2 % (ref 39.0–75.0)
Platelets: 128 10*3/uL — ABNORMAL LOW (ref 140–400)

## 2013-08-26 MED ORDER — ONDANSETRON HCL 8 MG PO TABS
8.0000 mg | ORAL_TABLET | Freq: Once | ORAL | Status: AC
  Administered 2013-08-26: 8 mg via ORAL

## 2013-08-26 MED ORDER — AZACITIDINE CHEMO SQ INJECTION
100.0000 mg/m2 | Freq: Once | INTRAMUSCULAR | Status: AC
  Administered 2013-08-26: 200 mg via SUBCUTANEOUS
  Filled 2013-08-26: qty 8

## 2013-08-26 NOTE — Patient Instructions (Signed)
[facility] Discharge Instructions for Patients Receiving Chemotherapy  Today you received the following chemotherapy agents vidaza  To help prevent nausea and vomiting after your treatment, we encourage you to take your nausea medication as needed   If you develop nausea and vomiting that is not controlled by your nausea medication, call the clinic.   BELOW ARE SYMPTOMS THAT SHOULD BE REPORTED IMMEDIATELY:  *FEVER GREATER THAN 100.5 F  *CHILLS WITH OR WITHOUT FEVER  NAUSEA AND VOMITING THAT IS NOT CONTROLLED WITH YOUR NAUSEA MEDICATION  *UNUSUAL SHORTNESS OF BREATH  *UNUSUAL BRUISING OR BLEEDING  TENDERNESS IN MOUTH AND THROAT WITH OR WITHOUT PRESENCE OF ULCERS  *URINARY PROBLEMS  *BOWEL PROBLEMS  UNUSUAL RASH Items with * indicate a potential emergency and should be followed up as soon as possible.  Feel free to call the clinic you have any questions or concerns. The clinic phone number is [phone].    

## 2013-08-26 NOTE — Telephone Encounter (Signed)
Per staff message and POF I have scheduled appts.  JMW  

## 2013-08-26 NOTE — Telephone Encounter (Signed)
gv adn printed appt sched and avs for pt for Sept...emailed MW to add tx.Marland KitchenMarland Kitchen

## 2013-08-27 ENCOUNTER — Ambulatory Visit (HOSPITAL_BASED_OUTPATIENT_CLINIC_OR_DEPARTMENT_OTHER): Payer: Medicare Other

## 2013-08-27 VITALS — BP 90/62 | HR 63 | Temp 97.0°F | Resp 20

## 2013-08-27 DIAGNOSIS — D46Z Other myelodysplastic syndromes: Secondary | ICD-10-CM

## 2013-08-27 DIAGNOSIS — C9200 Acute myeloblastic leukemia, not having achieved remission: Secondary | ICD-10-CM

## 2013-08-27 DIAGNOSIS — Z5111 Encounter for antineoplastic chemotherapy: Secondary | ICD-10-CM

## 2013-08-27 MED ORDER — AZACITIDINE CHEMO SQ INJECTION
100.0000 mg/m2 | Freq: Once | INTRAMUSCULAR | Status: AC
  Administered 2013-08-27: 200 mg via SUBCUTANEOUS
  Filled 2013-08-27: qty 8

## 2013-08-27 MED ORDER — ONDANSETRON HCL 8 MG PO TABS
8.0000 mg | ORAL_TABLET | Freq: Once | ORAL | Status: AC
  Administered 2013-08-27: 8 mg via ORAL

## 2013-08-27 NOTE — Progress Notes (Signed)
Pt BP in low normal range.  Pt asymptomatic.  Let him know to call if he has any problems with dizziness or weakness.

## 2013-08-27 NOTE — Patient Instructions (Signed)
[facility] Discharge Instructions for Patients Receiving Chemotherapy  Today you received the following chemotherapy agents: vidaza  To help prevent nausea and vomiting after your treatment, we encourage you to take your nausea medication.  Take it as often as prescribed.     If you develop nausea and vomiting that is not controlled by your nausea medication, call the clinic. If it is after clinic hours your family physician or the after hours number for the clinic or go to the Emergency Department.   BELOW ARE SYMPTOMS THAT SHOULD BE REPORTED IMMEDIATELY:  *FEVER GREATER THAN 100.5 F  *CHILLS WITH OR WITHOUT FEVER  NAUSEA AND VOMITING THAT IS NOT CONTROLLED WITH YOUR NAUSEA MEDICATION  *UNUSUAL SHORTNESS OF BREATH  *UNUSUAL BRUISING OR BLEEDING  TENDERNESS IN MOUTH AND THROAT WITH OR WITHOUT PRESENCE OF ULCERS  *URINARY PROBLEMS  *BOWEL PROBLEMS  UNUSUAL RASH Items with * indicate a potential emergency and should be followed up as soon as possible.  Feel free to call the clinic you have any questions or concerns. The clinic phone number is [phone].   I have been informed and understand all the instructions given to me. I know to contact the clinic, my physician, or go to the Emergency Department if any problems should occur. I do not have any questions at this time, but understand that I may call the clinic during office hours   should I have any questions or need assistance in obtaining follow up care.    __________________________________________  _____________  __________ Signature of Patient or Authorized Representative            Date                   Time    __________________________________________ Nurse's Signature    

## 2013-08-27 NOTE — Progress Notes (Signed)
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   CURRENT THERAPY: started on 11/11/2012 Vidaza d1-5, d8-9; of every 4 weeks cycle.   INTERVAL HISTORY:  Kyle Brewer 79 y.o. male returns for regular follow up. He reported that he had couple of very good weeks which he did not had for long time. He has more energy  Last night he had night sweats which was not bad. The patient reported a lminimal nasal congestion and wax in ears. Sometimes he has dysphagia. He had history of. dilatation of esophagus three  years ago. He constipated after chemotherapy. Kyle Brewer continue to have pain in knees when move around. He.had right knee replacement 12 years ago. The patient denied fever, chills. He denied headaches, double vision, blurry vision,  nasal discharge, hearing problems, odynophagia.  No chest pain, palpitations, dyspnea, cough, abdominal pain, nausea, vomiting, diarrhea, constipation, hematochezia. The patient denied dysuria, nocturia, polyuria, hematuria, myalgia, numbness, tingling, psychiatric problems.  Review of Systems  Constitutional: Negative for fever, chills, weight loss, malaise/fatigue and diaphoresis.  HENT: Positive for congestion. Negative for hearing loss, ear pain, nosebleeds, sore throat, neck pain and tinnitus.   Eyes: Negative for blurred vision, double vision, photophobia and pain.  Respiratory: Positive for shortness of breath. Negative for cough, hemoptysis, sputum production, wheezing and stridor.   Cardiovascular: Negative for chest pain, palpitations, orthopnea, claudication, leg swelling and PND.  Gastrointestinal: Positive for constipation. Negative for heartburn, nausea, vomiting, abdominal pain, diarrhea and blood in stool.    Genitourinary: Negative for dysuria, urgency, frequency and hematuria.  Musculoskeletal: Positive for joint pain. Negative for myalgias and back pain.  Skin: Negative for itching and rash.  Neurological: Negative for dizziness, tingling, sensory change, speech change, focal weakness, seizures, loss of consciousness, weakness and headaches.  Endo/Heme/Allergies: Does not bruise/bleed easily.  Psychiatric/Behavioral: Negative.      PAST MEDICAL HISTORY: Past Medical History  Diagnosis Date  . Inguinal hernia     right  . Osteoporosis   . Hypothyroid   . Hyperlipidemia   . Arthritis   . BPH (benign prostatic hyperplasia)   . Wound, open, elbow     Right, followed [facility]  . Hypertension   . CAD (coronary artery disease)     3V CABG 1995, relook cath 2001 with patent grafts. normal stress test 2010.   . Back pain   . CKD (chronic kidney disease), stage I   . Bursitis   . Pancytopenia   . Kidney stone   . 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 MDS (myelodysplastic syndrome), high grade     bone marrow biposy on 10/21/12 was normal; 02/21/12 showed trisomy 8; 05/15/2013 showed trisomy 8.     PAST SURGICAL HISTORY: Past Surgical History  Procedure Laterality Date  . Appendectomy  1968  . Coronary artery bypass graft  1995  . Total knee arthroplasty  2007    right  . Elbow arthroscopy  2012    right  . Transurethral resection of prostate      Dr. Isabel Caprice  . Hernia repair  11/07/11    lap bilateral inguinal    FAMILY HISTORY Family History  Problem Relation Age of Onset  . Cancer Father     lung  . Heart failure Mother   .  Cancer Brother     lung     HEALTH MAINTENANCE: History  Substance Use Topics  . Smoking status: Former Smoker -- 0.50 packs/day for 10 years    Quit date: 12/26/1963  . Smokeless tobacco: Never Used     Comment: quit 1965  . Alcohol Use: No    No Known Allergies  Current Outpatient Prescriptions  Medication Sig Dispense Refill  .  alendronate (FOSAMAX) 70 MG tablet Take 70 mg by mouth every Monday. Take with a full glass of water on an empty stomach.      Marland Kitchen amLODipine (NORVASC) 5 MG tablet Take 1 tablet (5 mg total) by mouth daily before breakfast.  90 tablet  3  . aspirin 81 MG tablet Take 81 mg by mouth daily.        . Calcium Carbonate-Vitamin D (CALCIUM 600 + D PO) Take 1 tablet by mouth 2 (two) times daily.      . finasteride (PROSCAR) 5 MG tablet Take 5 mg by mouth daily.      Marland Kitchen levothyroxine (SYNTHROID, LEVOTHROID) 50 MCG tablet Take 50 mcg by mouth every evening.       . lidocaine-prilocaine (EMLA) cream Apply topically as needed.  30 g  2  . lisinopril (PRINIVIL,ZESTRIL) 10 MG tablet Take 10 mg by mouth every evening.       Marland Kitchen LORazepam (ATIVAN) 0.5 MG tablet Take 0.5 mg by mouth at bedtime as needed for anxiety (sleep).       . meloxicam (MOBIC) 7.5 MG tablet Take 7.5 mg by mouth 2 (two) times daily.       . metoprolol succinate (TOPROL-XL) 100 MG 24 hr tablet Take 1 tablet (100 mg total) by mouth daily before breakfast. Take with or immediately following a meal.  90 tablet  3  . Multiple Vitamin (MULTIVITAMIN WITH MINERALS) TABS Take 1 tablet by mouth daily.      . simvastatin (ZOCOR) 20 MG tablet Take 20 mg by mouth at bedtime.        Marland Kitchen terazosin (HYTRIN) 10 MG capsule Take 10 mg by mouth at bedtime.      . vitamin B-12 (CYANOCOBALAMIN) 1000 MCG tablet Take 1,000 mcg by mouth daily.      . vitamin C (ASCORBIC ACID) 500 MG tablet Take 500 mg by mouth daily.       No current facility-administered medications for this visit.    OBJECTIVE: Filed Vitals:   08/26/13 0851  BP: 123/67  Pulse: 70  Temp: 98 F (36.7 C)  Resp: 18     Body mass index is 27.69 kg/(m^2).    ECOG FS:0  PHYSICAL EXAMINATION:  HEENT: Sclerae anicteric.  Conjunctivae were pink. Pupils round and reactive bilaterally. Oral mucosa is moist without ulceration or thrush. No occipital, submandibular, cervical, supraclavicular or axillar  adenopathy. Lungs: clear to auscultation without wheezes. No rales or rhonchi. Heart: regular rate and rhythm. No murmur, gallop or rubs. Abdomen: soft, non tender. No guarding or rebound tenderness. Bowel sounds are present. No palpable hepatosplenomegaly. MSK: no focal spinal tenderness. Extremities: No clubbing or cyanosis.No calf tenderness to palpitation, no peripheral edema. The patient had grossly intact strength in upper and lower extremities. Skin exam was without ecchymosis, petechiae. Neuro: non-focal, alert and oriented to time, person and place, appropriate affect  LAB RESULTS:  CMP     Component Value Date/Time   NA 144 08/26/2013 0953   NA 140 11/04/2012 1201   K 4.9 08/26/2013 0953  K 4.0 11/04/2012 1201   CL 111* 06/18/2013 0854   CL 107 11/04/2012 1201   CO2 25 08/26/2013 0953   CO2 26 11/04/2012 1201   GLUCOSE 61* 08/26/2013 0953   GLUCOSE 117* 06/18/2013 0854   GLUCOSE 85 11/04/2012 1201   BUN 25.2 08/26/2013 0953   BUN 26* 11/04/2012 1201   CREATININE 1.7* 08/26/2013 0953   CREATININE 1.56* 11/04/2012 1201   CALCIUM 9.8 08/26/2013 0953   CALCIUM 9.9 11/04/2012 1201   PROT 6.3* 08/26/2013 0953   ALBUMIN 3.3* 08/26/2013 0953   AST 14 08/26/2013 0953   ALT 15 08/26/2013 0953   ALKPHOS 53 08/26/2013 0953   BILITOT 0.52 08/26/2013 0953   GFRNONAA 39* 11/04/2012 1201   GFRAA 46* 11/04/2012 1201    Lab Results  Component Value Date   WBC 4.3 08/26/2013   NEUTROABS 2.5 08/26/2013   HGB 11.2* 08/26/2013   HCT 33.6* 08/26/2013   MCV 92.7 08/26/2013   PLT 128* 08/26/2013      Chemistry      Component Value Date/Time   NA 144 08/26/2013 0953   NA 140 11/04/2012 1201   K 4.9 08/26/2013 0953   K 4.0 11/04/2012 1201   CL 111* 06/18/2013 0854   CL 107 11/04/2012 1201   CO2 25 08/26/2013 0953   CO2 26 11/04/2012 1201   BUN 25.2 08/26/2013 0953   BUN 26* 11/04/2012 1201   CREATININE 1.7* 08/26/2013 0953   CREATININE 1.56* 11/04/2012 1201      Component Value Date/Time   CALCIUM 9.8 08/26/2013 0953     CALCIUM 9.9 11/04/2012 1201   ALKPHOS 53 08/26/2013 0953   AST 14 08/26/2013 0953   ALT 15 08/26/2013 0953   BILITOT 0.52 08/26/2013 0953      Urinalysis    Component Value Date/Time   LABSPEC 1.015 02/03/2013 1001   GLUCOSEU Negative 02/03/2013 1001   UROBILINOGEN 0.2 02/03/2013 1001    STUDIES: No results found.  ASSESSMENT AND PLAN: 1. 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  We will proceed with next cycle of Vidaza today.  2. Anemia, grade 1 and thrombocytopenia, grade 1. Secondary to MDS/AML. There is no active bleeding. There is no indication for transfusion today. Goal of transfusion Hgb <7 or 8; Plt <10K or active bleeding.  3. Follow up in 4 weeks with CBC and CMET  4 . Code status: FULL CODE.   Myra Rude, MD   08/27/2013 6:05 AM

## 2013-08-28 ENCOUNTER — Ambulatory Visit (HOSPITAL_BASED_OUTPATIENT_CLINIC_OR_DEPARTMENT_OTHER): Payer: Medicare Other

## 2013-08-28 VITALS — BP 118/59 | HR 61 | Temp 97.5°F

## 2013-08-28 DIAGNOSIS — Z5111 Encounter for antineoplastic chemotherapy: Secondary | ICD-10-CM

## 2013-08-28 DIAGNOSIS — C9200 Acute myeloblastic leukemia, not having achieved remission: Secondary | ICD-10-CM

## 2013-08-28 DIAGNOSIS — D46Z Other myelodysplastic syndromes: Secondary | ICD-10-CM

## 2013-08-28 MED ORDER — ONDANSETRON HCL 8 MG PO TABS
8.0000 mg | ORAL_TABLET | Freq: Once | ORAL | Status: AC
  Administered 2013-08-28: 8 mg via ORAL

## 2013-08-28 MED ORDER — AZACITIDINE CHEMO SQ INJECTION
100.0000 mg/m2 | Freq: Once | INTRAMUSCULAR | Status: AC
Start: 2013-08-28 — End: 2013-08-28
  Administered 2013-08-28: 200 mg via SUBCUTANEOUS
  Filled 2013-08-28: qty 8

## 2013-08-28 NOTE — Patient Instructions (Addendum)
[facility] Discharge Instructions for Patients Receiving Chemotherapy  Today you received the following chemotherapy agents Vidaza. To help prevent nausea and vomiting after your treatment, we encourage you to take your nausea medication as prescribed.    If you develop nausea and vomiting that is not controlled by your nausea medication, call the clinic.   BELOW ARE SYMPTOMS THAT SHOULD BE REPORTED IMMEDIATELY:  *FEVER GREATER THAN 100.5 F  *CHILLS WITH OR WITHOUT FEVER  NAUSEA AND VOMITING THAT IS NOT CONTROLLED WITH YOUR NAUSEA MEDICATION  *UNUSUAL SHORTNESS OF BREATH  *UNUSUAL BRUISING OR BLEEDING  TENDERNESS IN MOUTH AND THROAT WITH OR WITHOUT PRESENCE OF ULCERS  *URINARY PROBLEMS  *BOWEL PROBLEMS  UNUSUAL RASH Items with * indicate a potential emergency and should be followed up as soon as possible.  Feel free to call the clinic you have any questions or concerns. The clinic phone number is [phone].    

## 2013-08-29 ENCOUNTER — Ambulatory Visit (HOSPITAL_BASED_OUTPATIENT_CLINIC_OR_DEPARTMENT_OTHER): Payer: Medicare Other

## 2013-08-29 VITALS — BP 106/59 | HR 59 | Temp 97.8°F | Resp 20

## 2013-08-29 DIAGNOSIS — C9200 Acute myeloblastic leukemia, not having achieved remission: Secondary | ICD-10-CM

## 2013-08-29 DIAGNOSIS — D46Z Other myelodysplastic syndromes: Secondary | ICD-10-CM

## 2013-08-29 MED ORDER — ONDANSETRON HCL 8 MG PO TABS
8.0000 mg | ORAL_TABLET | Freq: Once | ORAL | Status: AC
  Administered 2013-08-29: 8 mg via ORAL

## 2013-08-29 MED ORDER — AZACITIDINE CHEMO SQ INJECTION
100.0000 mg/m2 | Freq: Once | INTRAMUSCULAR | Status: AC
  Administered 2013-08-29: 200 mg via SUBCUTANEOUS
  Filled 2013-08-29: qty 8

## 2013-08-29 MED ORDER — ONDANSETRON HCL 8 MG PO TABS
ORAL_TABLET | ORAL | Status: AC
  Filled 2013-08-29: qty 1

## 2013-08-29 NOTE — Patient Instructions (Addendum)
[facility] Discharge Instructions for Patients Receiving Chemotherapy  Today you received the following chemotherapy agents:  Vidaza  To help prevent nausea and vomiting after your treatment, we encourage you to take your nausea medication as ordered per MD.   If you develop nausea and vomiting that is not controlled by your nausea medication, call the clinic.   BELOW ARE SYMPTOMS THAT SHOULD BE REPORTED IMMEDIATELY:  *FEVER GREATER THAN 100.5 F  *CHILLS WITH OR WITHOUT FEVER  NAUSEA AND VOMITING THAT IS NOT CONTROLLED WITH YOUR NAUSEA MEDICATION  *UNUSUAL SHORTNESS OF BREATH  *UNUSUAL BRUISING OR BLEEDING  TENDERNESS IN MOUTH AND THROAT WITH OR WITHOUT PRESENCE OF ULCERS  *URINARY PROBLEMS  *BOWEL PROBLEMS  UNUSUAL RASH Items with * indicate a potential emergency and should be followed up as soon as possible.  Feel free to call the clinic you have any questions or concerns. The clinic phone number is [phone].    

## 2013-09-01 ENCOUNTER — Ambulatory Visit (HOSPITAL_BASED_OUTPATIENT_CLINIC_OR_DEPARTMENT_OTHER): Payer: Medicare Other

## 2013-09-01 VITALS — BP 125/64 | HR 66 | Temp 98.3°F | Resp 20

## 2013-09-01 DIAGNOSIS — C9200 Acute myeloblastic leukemia, not having achieved remission: Secondary | ICD-10-CM

## 2013-09-01 DIAGNOSIS — D469 Myelodysplastic syndrome, unspecified: Secondary | ICD-10-CM

## 2013-09-01 DIAGNOSIS — Z5111 Encounter for antineoplastic chemotherapy: Secondary | ICD-10-CM

## 2013-09-01 DIAGNOSIS — D46Z Other myelodysplastic syndromes: Secondary | ICD-10-CM

## 2013-09-01 MED ORDER — ONDANSETRON HCL 8 MG PO TABS
ORAL_TABLET | ORAL | Status: AC
  Filled 2013-09-01: qty 1

## 2013-09-01 MED ORDER — AZACITIDINE CHEMO SQ INJECTION
100.0000 mg/m2 | Freq: Once | INTRAMUSCULAR | Status: AC
  Administered 2013-09-01: 200 mg via SUBCUTANEOUS
  Filled 2013-09-01: qty 8

## 2013-09-01 MED ORDER — ONDANSETRON HCL 8 MG PO TABS
8.0000 mg | ORAL_TABLET | Freq: Once | ORAL | Status: AC
  Administered 2013-09-01: 8 mg via ORAL

## 2013-09-01 NOTE — Patient Instructions (Addendum)
[facility] Discharge Instructions for Patients Receiving Chemotherapy  Today you received the following chemotherapy agents: Vidaza   To help prevent nausea and vomiting after your treatment, we encourage you to take your nausea medication as directed by your physician.  If you develop nausea and vomiting that is not controlled by your nausea medication, call the clinic.   BELOW ARE SYMPTOMS THAT SHOULD BE REPORTED IMMEDIATELY:  *FEVER GREATER THAN 100.5 F  *CHILLS WITH OR WITHOUT FEVER  NAUSEA AND VOMITING THAT IS NOT CONTROLLED WITH YOUR NAUSEA MEDICATION  *UNUSUAL SHORTNESS OF BREATH  *UNUSUAL BRUISING OR BLEEDING  TENDERNESS IN MOUTH AND THROAT WITH OR WITHOUT PRESENCE OF ULCERS  *URINARY PROBLEMS  *BOWEL PROBLEMS  UNUSUAL RASH Items with * indicate a potential emergency and should be followed up as soon as possible.  Feel free to call the clinic you have any questions or concerns. The clinic phone number is [phone].    

## 2013-09-02 ENCOUNTER — Ambulatory Visit (HOSPITAL_BASED_OUTPATIENT_CLINIC_OR_DEPARTMENT_OTHER): Payer: Medicare Other

## 2013-09-02 VITALS — BP 115/59 | HR 59 | Temp 97.0°F | Resp 20

## 2013-09-02 DIAGNOSIS — D46Z Other myelodysplastic syndromes: Secondary | ICD-10-CM

## 2013-09-02 DIAGNOSIS — C9200 Acute myeloblastic leukemia, not having achieved remission: Secondary | ICD-10-CM

## 2013-09-02 DIAGNOSIS — Z5111 Encounter for antineoplastic chemotherapy: Secondary | ICD-10-CM

## 2013-09-02 MED ORDER — AZACITIDINE CHEMO SQ INJECTION
100.0000 mg/m2 | Freq: Once | INTRAMUSCULAR | Status: AC
  Administered 2013-09-02: 200 mg via SUBCUTANEOUS
  Filled 2013-09-02: qty 8

## 2013-09-02 MED ORDER — ONDANSETRON HCL 8 MG PO TABS
8.0000 mg | ORAL_TABLET | Freq: Once | ORAL | Status: AC
  Administered 2013-09-02: 8 mg via ORAL

## 2013-09-02 MED ORDER — ONDANSETRON HCL 8 MG PO TABS
ORAL_TABLET | ORAL | Status: AC
  Filled 2013-09-02: qty 1

## 2013-09-02 NOTE — Patient Instructions (Addendum)
[facility] Discharge Instructions for Patients Receiving Chemotherapy  Today you received the following chemotherapy agents :  Vidaza.  To help prevent nausea and vomiting after your treatment, we encourage you to take your nausea medication as instructed by your physicia.   If you develop nausea and vomiting that is not controlled by your nausea medication, call the clinic.   BELOW ARE SYMPTOMS THAT SHOULD BE REPORTED IMMEDIATELY:  *FEVER GREATER THAN 100.5 F  *CHILLS WITH OR WITHOUT FEVER  NAUSEA AND VOMITING THAT IS NOT CONTROLLED WITH YOUR NAUSEA MEDICATION  *UNUSUAL SHORTNESS OF BREATH  *UNUSUAL BRUISING OR BLEEDING  TENDERNESS IN MOUTH AND THROAT WITH OR WITHOUT PRESENCE OF ULCERS  *URINARY PROBLEMS  *BOWEL PROBLEMS  UNUSUAL RASH Items with * indicate a potential emergency and should be followed up as soon as possible.  Feel free to call the clinic you have any questions or concerns. The clinic phone number is [phone].

## 2013-09-03 ENCOUNTER — Ambulatory Visit (HOSPITAL_BASED_OUTPATIENT_CLINIC_OR_DEPARTMENT_OTHER): Payer: Medicare Other

## 2013-09-03 VITALS — BP 129/63 | HR 61 | Temp 98.1°F

## 2013-09-03 DIAGNOSIS — Z5111 Encounter for antineoplastic chemotherapy: Secondary | ICD-10-CM

## 2013-09-03 DIAGNOSIS — D46Z Other myelodysplastic syndromes: Secondary | ICD-10-CM

## 2013-09-03 DIAGNOSIS — C9200 Acute myeloblastic leukemia, not having achieved remission: Secondary | ICD-10-CM

## 2013-09-03 MED ORDER — ONDANSETRON HCL 8 MG PO TABS
ORAL_TABLET | ORAL | Status: AC
  Administered 2013-09-03: 8 mg via ORAL
  Filled 2013-09-03: qty 1

## 2013-09-03 MED ORDER — AZACITIDINE CHEMO SQ INJECTION
100.0000 mg/m2 | Freq: Once | INTRAMUSCULAR | Status: AC
  Administered 2013-09-03: 200 mg via SUBCUTANEOUS
  Filled 2013-09-03: qty 8

## 2013-09-03 MED ORDER — ONDANSETRON HCL 8 MG PO TABS
8.0000 mg | ORAL_TABLET | Freq: Once | ORAL | Status: AC
  Administered 2013-09-03: 8 mg via ORAL

## 2013-09-03 NOTE — Patient Instructions (Signed)
[facility] Discharge Instructions for Patients Receiving Chemotherapy  Today you received the following chemotherapy agents Vidaza. To help prevent nausea and vomiting after your treatment, we encourage you to take your nausea medication as prescribed.    If you develop nausea and vomiting that is not controlled by your nausea medication, call the clinic.   BELOW ARE SYMPTOMS THAT SHOULD BE REPORTED IMMEDIATELY:  *FEVER GREATER THAN 100.5 F  *CHILLS WITH OR WITHOUT FEVER  NAUSEA AND VOMITING THAT IS NOT CONTROLLED WITH YOUR NAUSEA MEDICATION  *UNUSUAL SHORTNESS OF BREATH  *UNUSUAL BRUISING OR BLEEDING  TENDERNESS IN MOUTH AND THROAT WITH OR WITHOUT PRESENCE OF ULCERS  *URINARY PROBLEMS  *BOWEL PROBLEMS  UNUSUAL RASH Items with * indicate a potential emergency and should be followed up as soon as possible.  Feel free to call the clinic you have any questions or concerns. The clinic phone number is [phone].    

## 2013-09-08 ENCOUNTER — Other Ambulatory Visit (HOSPITAL_BASED_OUTPATIENT_CLINIC_OR_DEPARTMENT_OTHER): Payer: Medicare Other

## 2013-09-08 DIAGNOSIS — C9200 Acute myeloblastic leukemia, not having achieved remission: Secondary | ICD-10-CM

## 2013-09-08 LAB — CBC WITH DIFFERENTIAL/PLATELET
BASO%: 0.7 % (ref 0.0–2.0)
EOS%: 2.1 % (ref 0.0–7.0)
HCT: 34.4 % — ABNORMAL LOW (ref 38.4–49.9)
LYMPH%: 33.3 % (ref 14.0–49.0)
MCH: 29.9 pg (ref 27.2–33.4)
MCHC: 32.7 g/dL (ref 32.0–36.0)
MONO#: 0.4 10*3/uL (ref 0.1–0.9)
MONO%: 7.8 % (ref 0.0–14.0)
NEUT%: 56.1 % (ref 39.0–75.0)
Platelets: 134 10*3/uL — ABNORMAL LOW (ref 140–400)
RBC: 3.75 10*6/uL — ABNORMAL LOW (ref 4.20–5.82)
WBC: 4.8 10*3/uL (ref 4.0–10.3)

## 2013-09-16 ENCOUNTER — Telehealth: Payer: Self-pay | Admitting: *Deleted

## 2013-09-16 NOTE — Telephone Encounter (Signed)
Lm informed the pt that his arrival time for 09/22/13 has changed. gv appts for 09/22/13 w/labs@ 12:30pm, ov@ 1pm, and tx@ 2pm.....td

## 2013-09-19 ENCOUNTER — Other Ambulatory Visit: Payer: Self-pay | Admitting: Hematology and Oncology

## 2013-09-22 ENCOUNTER — Ambulatory Visit (HOSPITAL_BASED_OUTPATIENT_CLINIC_OR_DEPARTMENT_OTHER): Payer: Medicare Other

## 2013-09-22 ENCOUNTER — Other Ambulatory Visit (HOSPITAL_BASED_OUTPATIENT_CLINIC_OR_DEPARTMENT_OTHER): Payer: Medicare Other

## 2013-09-22 ENCOUNTER — Ambulatory Visit: Payer: Medicare Other

## 2013-09-22 ENCOUNTER — Telehealth: Payer: Self-pay | Admitting: Hematology and Oncology

## 2013-09-22 ENCOUNTER — Encounter: Payer: Self-pay | Admitting: Hematology and Oncology

## 2013-09-22 ENCOUNTER — Ambulatory Visit (HOSPITAL_BASED_OUTPATIENT_CLINIC_OR_DEPARTMENT_OTHER): Payer: Medicare Other | Admitting: Hematology and Oncology

## 2013-09-22 VITALS — BP 136/69 | HR 63 | Temp 97.8°F | Resp 20 | Ht 68.0 in | Wt 183.7 lb

## 2013-09-22 DIAGNOSIS — D46Z Other myelodysplastic syndromes: Secondary | ICD-10-CM

## 2013-09-22 DIAGNOSIS — Z23 Encounter for immunization: Secondary | ICD-10-CM | POA: Insufficient documentation

## 2013-09-22 DIAGNOSIS — C9200 Acute myeloblastic leukemia, not having achieved remission: Secondary | ICD-10-CM

## 2013-09-22 DIAGNOSIS — N189 Chronic kidney disease, unspecified: Secondary | ICD-10-CM

## 2013-09-22 DIAGNOSIS — Z5111 Encounter for antineoplastic chemotherapy: Secondary | ICD-10-CM

## 2013-09-22 DIAGNOSIS — D631 Anemia in chronic kidney disease: Secondary | ICD-10-CM

## 2013-09-22 LAB — COMPREHENSIVE METABOLIC PANEL (CC13)
ALT: 16 U/L (ref 0–55)
AST: 16 U/L (ref 5–34)
Alkaline Phosphatase: 55 U/L (ref 40–150)
CO2: 23 mEq/L (ref 22–29)
Creatinine: 1.6 mg/dL — ABNORMAL HIGH (ref 0.7–1.3)
Sodium: 143 mEq/L (ref 136–145)
Total Bilirubin: 0.4 mg/dL (ref 0.20–1.20)
Total Protein: 6.2 g/dL — ABNORMAL LOW (ref 6.4–8.3)

## 2013-09-22 LAB — CBC WITH DIFFERENTIAL/PLATELET
BASO%: 0.7 % (ref 0.0–2.0)
HCT: 35.5 % — ABNORMAL LOW (ref 38.4–49.9)
LYMPH%: 36.3 % (ref 14.0–49.0)
MCHC: 32.5 g/dL (ref 32.0–36.0)
MCV: 91.2 fL (ref 79.3–98.0)
MONO#: 0.3 10*3/uL (ref 0.1–0.9)
MONO%: 6 % (ref 0.0–14.0)
NEUT%: 53.8 % (ref 39.0–75.0)
Platelets: 141 10*3/uL (ref 140–400)
RBC: 3.89 10*6/uL — ABNORMAL LOW (ref 4.20–5.82)
WBC: 4.5 10*3/uL (ref 4.0–10.3)

## 2013-09-22 MED ORDER — ONDANSETRON HCL 8 MG PO TABS
8.0000 mg | ORAL_TABLET | Freq: Once | ORAL | Status: AC
  Administered 2013-09-22: 8 mg via ORAL

## 2013-09-22 MED ORDER — AZACITIDINE CHEMO SQ INJECTION
100.0000 mg/m2 | Freq: Once | INTRAMUSCULAR | Status: AC
  Administered 2013-09-22: 200 mg via SUBCUTANEOUS
  Filled 2013-09-22: qty 8

## 2013-09-22 MED ORDER — INFLUENZA VAC SPLIT QUAD 0.5 ML IM SUSP
0.5000 mL | Freq: Once | INTRAMUSCULAR | Status: AC
  Administered 2013-09-22: 0.5 mL via INTRAMUSCULAR
  Filled 2013-09-22: qty 0.5

## 2013-09-22 MED ORDER — ONDANSETRON HCL 8 MG PO TABS
ORAL_TABLET | ORAL | Status: AC
  Filled 2013-09-22: qty 1

## 2013-09-22 NOTE — Patient Instructions (Addendum)
[facility] Discharge Instructions for Patients Receiving Chemotherapy  Today you received the following chemotherapy agent Vidaza. As well as a flu shot Fluarix.  To help prevent nausea and vomiting after your treatment, we encourage you to take your nausea medication.   If you develop nausea and vomiting that is not controlled by your nausea medication, call the clinic.   BELOW ARE SYMPTOMS THAT SHOULD BE REPORTED IMMEDIATELY:  *FEVER GREATER THAN 100.5 F  *CHILLS WITH OR WITHOUT FEVER  NAUSEA AND VOMITING THAT IS NOT CONTROLLED WITH YOUR NAUSEA MEDICATION  *UNUSUAL SHORTNESS OF BREATH  *UNUSUAL BRUISING OR BLEEDING  TENDERNESS IN MOUTH AND THROAT WITH OR WITHOUT PRESENCE OF ULCERS  *URINARY PROBLEMS  *BOWEL PROBLEMS  UNUSUAL RASH Items with * indicate a potential emergency and should be followed up as soon as possible.  Feel free to call the clinic you have any questions or concerns. The clinic phone number is [phone].  INFLUENZA VIRUS VACCINE (in floo EN zuh VAHY ruhs vak SEEN) helps to reduce the risk of getting influenza also known as the flu. This medicine may be used for other purposes; ask your health care provider or pharmacist if you have questions. What should I tell my health care provider before I take this medicine? They need to know if you have any of these conditions: -bleeding disorder like hemophilia -fever or infection -Guillain-Barre syndrome or other neurological problems -immune system problems -infection with the human immunodeficiency virus (HIV) or AIDS -low blood platelet counts -multiple sclerosis -an unusual or allergic reaction to influenza virus vaccine, eggs, chicken proteins, latex, gentamicin, other medicines, foods, dyes or preservatives -pregnant or trying to get pregnant -breast-feeding How should I use this medicine? This vaccine is for injection into a muscle. It is given by a health care professional. A copy  of Vaccine Information Statements will be given before each vaccination. Read this sheet carefully each time. The sheet may change frequently. Talk to your pediatrician regarding the use of this medicine in children. Special care may be needed. Overdosage: If you think you have taken too much of this medicine contact a poison control center or emergency room at once. NOTE: This medicine is only for you. Do not share this medicine with others. What if I miss a dose? This does not apply. What may interact with this medicine? -chemotherapy or radiation therapy -medicines that lower your immune system like etanercept, anakinra, infliximab, and adalimumab -medicines that treat or prevent blood clots like warfarin -phenytoin -steroid medicines like prednisone or cortisone -theophylline -vaccines This list may not describe all possible interactions. Give your health care provider a list of all the medicines, herbs, non-prescription drugs, or dietary supplements you use. Also tell them if you smoke, drink alcohol, or use illegal drugs. Some items may interact with your medicine. What should I watch for while using this medicine? Report any side effects that do not go away within 3 days to your doctor or health care professional. Call your health care provider if any unusual symptoms occur within 6 weeks of receiving this vaccine. You may still catch the flu, but the illness is not usually as bad. You cannot get the flu from the vaccine. The vaccine will not protect against colds or other illnesses that may cause fever. The vaccine is needed every year. What side effects may I notice from receiving this medicine? Side effects that you should report to your doctor or health care professional as soon as possible: -allergic  reactions like skin rash, itching or hives, swelling of the face, lips, or tongue Side effects that usually do not require medical attention (report to your doctor or health care  professional if they continue or are bothersome): -fever -headache -muscle aches and pains -pain, tenderness, redness, or swelling at site where injected -weak or tired This list may not describe all possible side effects. Call your doctor for medical advice about side effects. You may report side effects to FDA at 1-800-FDA-1088. Where should I keep my medicine? This vaccine is only given in a clinic, pharmacy, doctor's office, or other health care setting and will not be stored at home. NOTE: This sheet is a summary. It may not cover all possible information. If you have questions about this medicine, talk to your doctor, pharmacist, or health care provider.  2012, [employer]vier/Gold Standard. (07/08/2008 9:30:40 AM)

## 2013-09-22 NOTE — Progress Notes (Signed)
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 This is a very pleasant gentleman with prior history of pancytopenia, was first seen in October 2013. Since then, he had multiple bone marrow aspirate and biopsy. His initial bone marrow biopsy in October 2002 showed 20% blast, suspicious for AML. Due to his age, he was offered Vidaza. In May of 2014, repeat bone marrow aspirate and biopsy show his blast count has reduced to about 9%.   INTERVAL HISTORY: Kyle Brewer 79 y.o. male returns for further followup prior to his cycle 11 of chemotherapy. He tolerated treatment very well. He did not require any form of transfusion for the past 6 months. Denies any recent fevers, chills, or infection. He denies any bleeding such as epistaxis, hematuria, or hematochezia. His appetite is stable, and he has no recent weight loss. He complained of mild fatigue but able to perform most activities of daily living.  I have reviewed the past medical history, past surgical history, social history and family history with the patient and they are unchanged from previous note.  ALLERGIES:  has No Known Allergies.  MEDICATIONS: Current outpatient prescriptions:alendronate (FOSAMAX) 70 MG tablet, Take 70 mg by mouth every Monday. Take with a full glass of water on an empty stomach., Disp: , Rfl: ;  amLODipine (NORVASC) 5 MG tablet, Take 1 tablet (5 mg total) by mouth daily before breakfast., Disp: 90 tablet, Rfl: 3;  aspirin 81 MG tablet, Take 81 mg by mouth daily.  , Disp: , Rfl:  Calcium Carbonate-Vitamin D (CALCIUM 600 + D PO), Take 1 tablet by mouth 2 (two) times daily., Disp: , Rfl: ;  finasteride (PROSCAR) 5 MG tablet, Take 5 mg by mouth daily., Disp: , Rfl: ;   levothyroxine (SYNTHROID, LEVOTHROID) 50 MCG tablet, Take 50 mcg by mouth every evening. , Disp: , Rfl: ;  lidocaine-prilocaine (EMLA) cream, Apply topically as needed., Disp: 30 g, Rfl: 2 lisinopril (PRINIVIL,ZESTRIL) 10 MG tablet, Take 10 mg by mouth every evening. , Disp: , Rfl: ;  LORazepam (ATIVAN) 0.5 MG tablet, Take 0.5 mg by mouth at bedtime as needed for anxiety (sleep). , Disp: , Rfl: ;  meloxicam (MOBIC) 7.5 MG tablet, Take 7.5 mg by mouth 2 (two) times daily. , Disp: , Rfl:  metoprolol succinate (TOPROL-XL) 100 MG 24 hr tablet, Take 1 tablet (100 mg total) by mouth daily before breakfast. Take with or immediately following a meal., Disp: 90 tablet, Rfl: 3;  Multiple Vitamin (MULTIVITAMIN WITH MINERALS) TABS, Take 1 tablet by mouth daily., Disp: , Rfl: ;  simvastatin (ZOCOR) 20 MG tablet, Take 20 mg by mouth at bedtime.  , Disp: , Rfl: ;  terazosin (HYTRIN) 10 MG capsule, Take 10 mg by mouth at bedtime., Disp: , Rfl:  vitamin B-12 (CYANOCOBALAMIN) 1000 MCG tablet, Take 1,000 mcg by mouth daily., Disp: , Rfl: ;  vitamin C (ASCORBIC ACID) 500 MG tablet, Take 500 mg by mouth daily., Disp: , Rfl:  Current facility-administered medications:[START ON 09/23/2013] influenza vac split quadrivalent PF (FLUARIX) injection 0.5 mL, 0.5 mL, Intramuscular, Tomorrow-1000, Sonjia Wilcoxson, MD  REVIEW OF SYSTEMS:   Constitutional: Denies fevers, chills or abnormal weight loss Eyes: Denies blurriness of vision Ears, nose, mouth, throat, and face: Denies mucositis or sore throat Respiratory: Denies cough,  dyspnea or wheezes Cardiovascular: Denies palpitation, chest discomfort or lower extremity swelling Gastrointestinal:  Denies nausea, heartburn or change in bowel habits Skin: Denies abnormal skin rashes Lymphatics: Denies new lymphadenopathy or easy bruising Neurological:Denies numbness, tingling or new weaknesses Behavioral/Psych: Mood is stable, no new changes  All other systems were reviewed with the patient  and are negative.  PHYSICAL EXAMINATION: ECOG PERFORMANCE STATUS: 1 - Symptomatic but completely ambulatory  Filed Vitals:   09/22/13 1259  BP: 136/69  Pulse: 63  Temp: 97.8 F (36.6 C)  Resp: 20   Filed Weights   09/22/13 1259  Weight: 183 lb 11.2 oz (83.326 kg)    GENERAL:alert, no distress and comfortable SKIN: skin color, texture, turgor are normal, no rashes or significant lesions EYES: normal, Conjunctiva are pink and non-injected, sclera clear OROPHARYNX:no exudate, no erythema and lips, buccal mucosa, and tongue normal  NECK: supple, thyroid normal size, non-tender, without nodularity LYMPH:  no palpable lymphadenopathy in the cervical, axillary or inguinal LUNGS: clear to auscultation and percussion with normal breathing effort HEART: regular rate & rhythm and no murmurs and no lower extremity edema ABDOMEN:abdomen soft, non-tender and normal bowel sounds Musculoskeletal:no cyanosis of digits and no clubbing  NEURO: alert & oriented x 3 with fluent speech, no focal motor/sensory deficits  LABORATORY DATA:  I have reviewed the data as listed    Component Value Date/Time   NA 144 08/26/2013 0953   NA 140 11/04/2012 1201   K 4.9 08/26/2013 0953   K 4.0 11/04/2012 1201   CL 111* 06/18/2013 0854   CL 107 11/04/2012 1201   CO2 25 08/26/2013 0953   CO2 26 11/04/2012 1201   GLUCOSE 61* 08/26/2013 0953   GLUCOSE 117* 06/18/2013 0854   GLUCOSE 85 11/04/2012 1201   BUN 25.2 08/26/2013 0953   BUN 26* 11/04/2012 1201   CREATININE 1.7* 08/26/2013 0953   CREATININE 1.56* 11/04/2012 1201   CALCIUM 9.8 08/26/2013 0953   CALCIUM 9.9 11/04/2012 1201   PROT 6.3* 08/26/2013 0953   ALBUMIN 3.3* 08/26/2013 0953   AST 14 08/26/2013 0953   ALT 15 08/26/2013 0953   ALKPHOS 53 08/26/2013 0953   BILITOT 0.52 08/26/2013 0953   GFRNONAA 39* 11/04/2012 1201   GFRAA 46* 11/04/2012 1201    I No results found for this basename: SPEP, UPEP,  kappa and lambda light chains    Lab Results  Component Value  Date   WBC 4.5 09/22/2013   NEUTROABS 2.4 09/22/2013   HGB 11.5* 09/22/2013   HCT 35.5* 09/22/2013   MCV 91.2 09/22/2013   PLT 141 09/22/2013      Chemistry      Component Value Date/Time   NA 144 08/26/2013 0953   NA 140 11/04/2012 1201   K 4.9 08/26/2013 0953   K 4.0 11/04/2012 1201   CL 111* 06/18/2013 0854   CL 107 11/04/2012 1201   CO2 25 08/26/2013 0953   CO2 26 11/04/2012 1201   BUN 25.2 08/26/2013 0953   BUN 26* 11/04/2012 1201   CREATININE 1.7* 08/26/2013 0953   CREATININE 1.56* 11/04/2012 1201      Component Value Date/Time   CALCIUM 9.8 08/26/2013 0953   CALCIUM 9.9 11/04/2012 1201   ALKPHOS 53 08/26/2013 0953   AST 14 08/26/2013 0953   ALT 15 08/26/2013 0953   BILITOT 0.52 08/26/2013 0953      ASSESSMENT: Possible diagnosis of acute myelogenous leukemia, with good response to chemotherapy  PLAN:  #1 history of AML,  with partial response to treatment He is doing very well on treatment. His last bone marrow aspirate and biopsy show partial response to treatment. His platelet count and white count continued to improve over time. I will continue treatment with a dose adjustment. I would recommend repeating another bone marrow aspirate and biopsy and a November of this year, 6 months away from his last chemotherapy unless he become pancytopenic again. #2 mild anemia The patient is responding well to treatment. His leukopenia and thrombocytopenia has resolved. With his anemia he is asymptomatic. Will observe. #3 chronic kidney disease This is unchanged compared to his baseline. We'll observe. #4 preventive care I discussed with the patient the risks and benefits of influenza vaccination. He is in agreement to proceed. We'll give him his influenza vaccination today.  All questions were answered. The patient knows to call the clinic with any problems, questions or concerns. We can certainly see the patient much sooner if necessary. No barriers to learning was detected. I spent 25 minutes  counseling the patient face to face. The total time spent in the appointment was 40 minutes and more than 50% was on counseling and review of test results     [facility], Kyle Adelsberger, MD 09/22/2013 1:23 PM

## 2013-09-22 NOTE — Telephone Encounter (Signed)
[facility] ADN PRINTED APPT SCHED ADN AVS FOR PT FOR oct.Marland Kitchen

## 2013-09-23 ENCOUNTER — Ambulatory Visit (HOSPITAL_BASED_OUTPATIENT_CLINIC_OR_DEPARTMENT_OTHER): Payer: Medicare Other

## 2013-09-23 VITALS — BP 99/56 | HR 60 | Temp 97.7°F

## 2013-09-23 DIAGNOSIS — D46Z Other myelodysplastic syndromes: Secondary | ICD-10-CM

## 2013-09-23 DIAGNOSIS — Z5111 Encounter for antineoplastic chemotherapy: Secondary | ICD-10-CM

## 2013-09-23 DIAGNOSIS — C9200 Acute myeloblastic leukemia, not having achieved remission: Secondary | ICD-10-CM

## 2013-09-23 MED ORDER — ONDANSETRON HCL 8 MG PO TABS
ORAL_TABLET | ORAL | Status: AC
  Filled 2013-09-23: qty 1

## 2013-09-23 MED ORDER — AZACITIDINE CHEMO SQ INJECTION
100.0000 mg/m2 | Freq: Once | INTRAMUSCULAR | Status: AC
  Administered 2013-09-23: 200 mg via SUBCUTANEOUS
  Filled 2013-09-23: qty 8

## 2013-09-23 MED ORDER — ONDANSETRON HCL 8 MG PO TABS
8.0000 mg | ORAL_TABLET | Freq: Once | ORAL | Status: AC
  Administered 2013-09-23: 8 mg via ORAL

## 2013-09-23 NOTE — Patient Instructions (Addendum)
[facility] Discharge Instructions for Patients Receiving Chemotherapy  Today you received the following chemotherapy agent: Vidaza  If you develop nausea, call the clinic.   BELOW ARE SYMPTOMS THAT SHOULD BE REPORTED IMMEDIATELY:  *FEVER GREATER THAN 100.5 F  *CHILLS WITH OR WITHOUT FEVER  NAUSEA AND VOMITING THAT IS NOT CONTROLLED WITH YOUR NAUSEA MEDICATION  *UNUSUAL SHORTNESS OF BREATH  *UNUSUAL BRUISING OR BLEEDING  TENDERNESS IN MOUTH AND THROAT WITH OR WITHOUT PRESENCE OF ULCERS  *URINARY PROBLEMS  *BOWEL PROBLEMS  UNUSUAL RASH Items with * indicate a potential emergency and should be followed up as soon as possible.  Feel free to call the clinic you have any questions or concerns. The clinic phone number is [phone].  It has been a pleasure to serve you today!

## 2013-09-24 ENCOUNTER — Ambulatory Visit (HOSPITAL_BASED_OUTPATIENT_CLINIC_OR_DEPARTMENT_OTHER): Payer: Medicare Other

## 2013-09-24 VITALS — BP 118/54 | HR 63 | Temp 97.7°F | Resp 20

## 2013-09-24 DIAGNOSIS — Z5111 Encounter for antineoplastic chemotherapy: Secondary | ICD-10-CM

## 2013-09-24 DIAGNOSIS — C9200 Acute myeloblastic leukemia, not having achieved remission: Secondary | ICD-10-CM

## 2013-09-24 DIAGNOSIS — D46Z Other myelodysplastic syndromes: Secondary | ICD-10-CM

## 2013-09-24 MED ORDER — AZACITIDINE CHEMO SQ INJECTION
100.0000 mg/m2 | Freq: Once | INTRAMUSCULAR | Status: AC
  Administered 2013-09-24: 200 mg via SUBCUTANEOUS
  Filled 2013-09-24: qty 8

## 2013-09-24 MED ORDER — ONDANSETRON HCL 8 MG PO TABS
ORAL_TABLET | ORAL | Status: AC
  Filled 2013-09-24: qty 1

## 2013-09-24 MED ORDER — ONDANSETRON HCL 8 MG PO TABS
8.0000 mg | ORAL_TABLET | Freq: Once | ORAL | Status: AC
  Administered 2013-09-24: 8 mg via ORAL

## 2013-09-24 NOTE — Patient Instructions (Addendum)
[facility] Discharge Instructions for Patients Receiving Chemotherapy  Today you received the following chemotherapy agents:  Vidaza  To help prevent nausea and vomiting after your treatment, we encourage you to take your nausea medication as ordered per MD.   If you develop nausea and vomiting that is not controlled by your nausea medication, call the clinic.   BELOW ARE SYMPTOMS THAT SHOULD BE REPORTED IMMEDIATELY:  *FEVER GREATER THAN 100.5 F  *CHILLS WITH OR WITHOUT FEVER  NAUSEA AND VOMITING THAT IS NOT CONTROLLED WITH YOUR NAUSEA MEDICATION  *UNUSUAL SHORTNESS OF BREATH  *UNUSUAL BRUISING OR BLEEDING  TENDERNESS IN MOUTH AND THROAT WITH OR WITHOUT PRESENCE OF ULCERS  *URINARY PROBLEMS  *BOWEL PROBLEMS  UNUSUAL RASH Items with * indicate a potential emergency and should be followed up as soon as possible.  Feel free to call the clinic you have any questions or concerns. The clinic phone number is [phone].    

## 2013-09-25 ENCOUNTER — Ambulatory Visit (HOSPITAL_BASED_OUTPATIENT_CLINIC_OR_DEPARTMENT_OTHER): Payer: Medicare Other

## 2013-09-25 VITALS — BP 124/63 | HR 59 | Temp 98.0°F

## 2013-09-25 DIAGNOSIS — D46Z Other myelodysplastic syndromes: Secondary | ICD-10-CM

## 2013-09-25 DIAGNOSIS — Z5111 Encounter for antineoplastic chemotherapy: Secondary | ICD-10-CM

## 2013-09-25 DIAGNOSIS — C9200 Acute myeloblastic leukemia, not having achieved remission: Secondary | ICD-10-CM

## 2013-09-25 MED ORDER — ONDANSETRON HCL 8 MG PO TABS
ORAL_TABLET | ORAL | Status: AC
  Filled 2013-09-25: qty 1

## 2013-09-25 MED ORDER — ONDANSETRON HCL 8 MG PO TABS
8.0000 mg | ORAL_TABLET | Freq: Once | ORAL | Status: AC
  Administered 2013-09-25: 8 mg via ORAL

## 2013-09-25 MED ORDER — AZACITIDINE CHEMO SQ INJECTION
100.0000 mg/m2 | Freq: Once | INTRAMUSCULAR | Status: AC
  Administered 2013-09-25: 200 mg via SUBCUTANEOUS
  Filled 2013-09-25: qty 8

## 2013-09-25 NOTE — Patient Instructions (Signed)
[facility] Discharge Instructions for Patients Receiving Chemotherapy  Today you received the following chemotherapy agents vidaza  To help prevent nausea and vomiting after your treatment, we encourage you to take your nausea medication as needed   If you develop nausea and vomiting that is not controlled by your nausea medication, call the clinic.   BELOW ARE SYMPTOMS THAT SHOULD BE REPORTED IMMEDIATELY:  *FEVER GREATER THAN 100.5 F  *CHILLS WITH OR WITHOUT FEVER  NAUSEA AND VOMITING THAT IS NOT CONTROLLED WITH YOUR NAUSEA MEDICATION  *UNUSUAL SHORTNESS OF BREATH  *UNUSUAL BRUISING OR BLEEDING  TENDERNESS IN MOUTH AND THROAT WITH OR WITHOUT PRESENCE OF ULCERS  *URINARY PROBLEMS  *BOWEL PROBLEMS  UNUSUAL RASH Items with * indicate a potential emergency and should be followed up as soon as possible.  Feel free to call the clinic you have any questions or concerns. The clinic phone number is [phone].    

## 2013-09-26 ENCOUNTER — Ambulatory Visit (HOSPITAL_BASED_OUTPATIENT_CLINIC_OR_DEPARTMENT_OTHER): Payer: Medicare Other

## 2013-09-26 VITALS — BP 111/60 | HR 64 | Resp 20

## 2013-09-26 DIAGNOSIS — Z5111 Encounter for antineoplastic chemotherapy: Secondary | ICD-10-CM

## 2013-09-26 DIAGNOSIS — D46Z Other myelodysplastic syndromes: Secondary | ICD-10-CM

## 2013-09-26 DIAGNOSIS — C9200 Acute myeloblastic leukemia, not having achieved remission: Secondary | ICD-10-CM

## 2013-09-26 MED ORDER — ONDANSETRON HCL 8 MG PO TABS
8.0000 mg | ORAL_TABLET | Freq: Once | ORAL | Status: AC
  Administered 2013-09-26: 8 mg via ORAL

## 2013-09-26 MED ORDER — AZACITIDINE CHEMO SQ INJECTION
100.0000 mg/m2 | Freq: Once | INTRAMUSCULAR | Status: AC
  Administered 2013-09-26: 200 mg via SUBCUTANEOUS
  Filled 2013-09-26: qty 8

## 2013-09-26 MED ORDER — ONDANSETRON HCL 8 MG PO TABS
ORAL_TABLET | ORAL | Status: AC
  Filled 2013-09-26: qty 1

## 2013-09-26 NOTE — Patient Instructions (Addendum)
[facility] Discharge Instructions for Patients Receiving Chemotherapy  Today you received the following chemotherapy agents: vidaza  To help prevent nausea and vomiting after your treatment, we encourage you to take your nausea medication.  Take it as often as prescribed.     If you develop nausea and vomiting that is not controlled by your nausea medication, call the clinic. If it is after clinic hours your family physician or the after hours number for the clinic or go to the Emergency Department.   BELOW ARE SYMPTOMS THAT SHOULD BE REPORTED IMMEDIATELY:  *FEVER GREATER THAN 100.5 F  *CHILLS WITH OR WITHOUT FEVER  NAUSEA AND VOMITING THAT IS NOT CONTROLLED WITH YOUR NAUSEA MEDICATION  *UNUSUAL SHORTNESS OF BREATH  *UNUSUAL BRUISING OR BLEEDING  TENDERNESS IN MOUTH AND THROAT WITH OR WITHOUT PRESENCE OF ULCERS  *URINARY PROBLEMS  *BOWEL PROBLEMS  UNUSUAL RASH Items with * indicate a potential emergency and should be followed up as soon as possible.  Feel free to call the clinic you have any questions or concerns. The clinic phone number is [phone].   I have been informed and understand all the instructions given to me. I know to contact the clinic, my physician, or go to the Emergency Department if any problems should occur. I do not have any questions at this time, but understand that I may call the clinic during office hours   should I have any questions or need assistance in obtaining follow up care.    __________________________________________  _____________  __________ Signature of Patient or Authorized Representative            Date                   Time    __________________________________________ Nurse's Signature    

## 2013-09-29 ENCOUNTER — Ambulatory Visit (HOSPITAL_BASED_OUTPATIENT_CLINIC_OR_DEPARTMENT_OTHER): Payer: Medicare Other

## 2013-09-29 VITALS — BP 130/79 | HR 66 | Temp 96.9°F | Resp 18

## 2013-09-29 DIAGNOSIS — C9200 Acute myeloblastic leukemia, not having achieved remission: Secondary | ICD-10-CM

## 2013-09-29 DIAGNOSIS — D46Z Other myelodysplastic syndromes: Secondary | ICD-10-CM

## 2013-09-29 DIAGNOSIS — Z5111 Encounter for antineoplastic chemotherapy: Secondary | ICD-10-CM

## 2013-09-29 MED ORDER — AZACITIDINE CHEMO SQ INJECTION
100.0000 mg/m2 | Freq: Once | INTRAMUSCULAR | Status: AC
  Administered 2013-09-29: 200 mg via SUBCUTANEOUS
  Filled 2013-09-29: qty 8

## 2013-09-29 MED ORDER — ONDANSETRON HCL 8 MG PO TABS
ORAL_TABLET | ORAL | Status: AC
  Filled 2013-09-29: qty 1

## 2013-09-29 MED ORDER — ONDANSETRON HCL 8 MG PO TABS
8.0000 mg | ORAL_TABLET | Freq: Once | ORAL | Status: AC
  Administered 2013-09-29: 8 mg via ORAL

## 2013-09-29 NOTE — Patient Instructions (Addendum)
Azacitidine suspension for injection (subcutaneous use) What is this medicine? AZACITIDINE (ay za SITE i deen) is a chemotherapy drug. This medicine reduces the growth of cancer cells and can suppress the immune system. It is used for treating myelodysplastic syndrome or some types of leukemia. This medicine may be used for other purposes; ask your health care provider or pharmacist if you have questions. What should I tell my health care provider before I take this medicine? They need to know if you have any of these conditions: -infection (especially a virus infection such as chickenpox, cold sores, or herpes) -kidney disease -liver disease -liver tumors -an unusual or allergic reaction to azacitidine, mannitol, other medicines, foods, dyes, or preservatives -pregnant or trying to get pregnant -breast-feeding How should I use this medicine? This medicine is for injection under the skin. It is administered in a hospital or clinic by a specially trained health care professional. Talk to your pediatrician regarding the use of this medicine in children. While this drug may be prescribed for selected conditions, precautions do apply. Overdosage: If you think you have taken too much of this medicine contact a poison control center or emergency room at once. NOTE: This medicine is only for you. Do not share this medicine with others. What if I miss a dose? It is important not to miss your dose. Call your doctor or health care professional if you are unable to keep an appointment. What may interact with this medicine? -vaccines Talk to your doctor or health care professional before taking any of these medicines: -acetaminophen -aspirin -ibuprofen -ketoprofen -naproxen This list may not describe all possible interactions. Give your health care provider a list of all the medicines, herbs, non-prescription drugs, or dietary supplements you use. Also tell them if you smoke, drink alcohol, or use  illegal drugs. Some items may interact with your medicine. What should I watch for while using this medicine? Visit your doctor for checks on your progress. This drug may make you feel generally unwell. This is not uncommon, as chemotherapy can affect healthy cells as well as cancer cells. Report any side effects. Continue your course of treatment even though you feel ill unless your doctor tells you to stop. In some cases, you may be given additional medicines to help with side effects. Follow all directions for their use. Call your doctor or health care professional for advice if you get a fever, chills or sore throat, or other symptoms of a cold or flu. Do not treat yourself. This drug decreases your body's ability to fight infections. Try to avoid being around people who are sick. This medicine may increase your risk to bruise or bleed. Call your doctor or health care professional if you notice any unusual bleeding. Be careful brushing and flossing your teeth or using a toothpick because you may get an infection or bleed more easily. If you have any dental work done, tell your dentist you are receiving this medicine. Avoid taking products that contain aspirin, acetaminophen, ibuprofen, naproxen, or ketoprofen unless instructed by your doctor. These medicines may hide a fever. Do not have any vaccinations without your doctor's approval and avoid anyone who has recently had oral polio vaccine. Do not become pregnant while taking this medicine. Women should inform their doctor if they wish to become pregnant or think they might be pregnant. There is a potential for serious side effects to an unborn child. Talk to your health care professional or pharmacist for more information. Do not breast-feed an   infant while taking this medicine. If you are a man, you should not father a child while receiving treatment. What side effects may I notice from receiving this medicine? Side effects that you should report  to your doctor or health care professional as soon as possible: -allergic reactions like skin rash, itching or hives, swelling of the face, lips, or tongue -low blood counts - this medicine may decrease the number of white blood cells, red blood cells and platelets. You may be at increased risk for infections and bleeding. -signs of infection - fever or chills, cough, sore throat, pain or difficulty passing urine -signs of decreased platelets or bleeding - bruising, pinpoint red spots on the skin, black, tarry stools, blood in the urine -signs of decreased red blood cells - unusually weak or tired, fainting spells, lightheadedness -reactions at the injection site including redness, pain, itching, or bruising -breathing problems -changes in vision -fever -mouth sores -stomach pain -vomiting Side effects that usually do not require medical attention (report to your doctor or health care professional if they continue or are bothersome): -constipation -diarrhea -loss of appetite -nausea -pain or redness at the injection site -weak or tired This list may not describe all possible side effects. Call your doctor for medical advice about side effects. You may report side effects to FDA at 1-800-FDA-1088. Where should I keep my medicine? This drug is given in a hospital or clinic and will not be stored at home. NOTE: This sheet is a summary. It may not cover all possible information. If you have questions about this medicine, talk to your doctor, pharmacist, or health care provider.  2013, [employer]. (03/05/2008 11:04:07 AM)  

## 2013-09-30 ENCOUNTER — Ambulatory Visit (HOSPITAL_BASED_OUTPATIENT_CLINIC_OR_DEPARTMENT_OTHER): Payer: Medicare Other

## 2013-09-30 VITALS — BP 112/62 | HR 66 | Temp 97.4°F

## 2013-09-30 DIAGNOSIS — D46Z Other myelodysplastic syndromes: Secondary | ICD-10-CM

## 2013-09-30 DIAGNOSIS — C9200 Acute myeloblastic leukemia, not having achieved remission: Secondary | ICD-10-CM

## 2013-09-30 DIAGNOSIS — Z5111 Encounter for antineoplastic chemotherapy: Secondary | ICD-10-CM

## 2013-09-30 MED ORDER — ONDANSETRON HCL 8 MG PO TABS
ORAL_TABLET | ORAL | Status: AC
  Filled 2013-09-30: qty 1

## 2013-09-30 MED ORDER — ONDANSETRON HCL 8 MG PO TABS
8.0000 mg | ORAL_TABLET | Freq: Once | ORAL | Status: AC
  Administered 2013-09-30: 8 mg via ORAL

## 2013-09-30 MED ORDER — AZACITIDINE CHEMO SQ INJECTION
100.0000 mg/m2 | Freq: Once | INTRAMUSCULAR | Status: AC
  Administered 2013-09-30: 200 mg via SUBCUTANEOUS
  Filled 2013-09-30: qty 8

## 2013-09-30 NOTE — Patient Instructions (Addendum)
[facility] Discharge Instructions for Patients Receiving Chemotherapy  Today you received the following chemotherapy agents Vidaza.     If you develop nausea and vomiting that is not controlled, call the clinic.   BELOW ARE SYMPTOMS THAT SHOULD BE REPORTED IMMEDIATELY:  *FEVER GREATER THAN 100.5 F  *CHILLS WITH OR WITHOUT FEVER  NAUSEA AND VOMITING THAT IS NOT CONTROLLED WITH YOUR NAUSEA MEDICATION  *UNUSUAL SHORTNESS OF BREATH  *UNUSUAL BRUISING OR BLEEDING  TENDERNESS IN MOUTH AND THROAT WITH OR WITHOUT PRESENCE OF ULCERS  *URINARY PROBLEMS  *BOWEL PROBLEMS  UNUSUAL RASH Items with * indicate a potential emergency and should be followed up as soon as possible.  Feel free to call the clinic you have any questions or concerns. The clinic phone number is [phone].

## 2013-09-30 NOTE — Progress Notes (Signed)
Patient changed his mind and requested that all injections today be given on his right side X4

## 2013-10-20 ENCOUNTER — Telehealth: Payer: Self-pay | Admitting: *Deleted

## 2013-10-20 ENCOUNTER — Ambulatory Visit (HOSPITAL_BASED_OUTPATIENT_CLINIC_OR_DEPARTMENT_OTHER): Payer: Medicare Other | Admitting: Hematology and Oncology

## 2013-10-20 ENCOUNTER — Other Ambulatory Visit (HOSPITAL_BASED_OUTPATIENT_CLINIC_OR_DEPARTMENT_OTHER): Payer: Medicare Other | Admitting: Lab

## 2013-10-20 ENCOUNTER — Other Ambulatory Visit: Payer: Self-pay | Admitting: Hematology and Oncology

## 2013-10-20 ENCOUNTER — Telehealth: Payer: Self-pay | Admitting: Internal Medicine

## 2013-10-20 ENCOUNTER — Telehealth: Payer: Self-pay | Admitting: Hematology and Oncology

## 2013-10-20 ENCOUNTER — Encounter: Payer: Self-pay | Admitting: Hematology and Oncology

## 2013-10-20 VITALS — BP 152/70 | HR 59 | Temp 97.0°F | Resp 18 | Ht 68.0 in | Wt 185.2 lb

## 2013-10-20 DIAGNOSIS — D649 Anemia, unspecified: Secondary | ICD-10-CM

## 2013-10-20 DIAGNOSIS — D46Z Other myelodysplastic syndromes: Secondary | ICD-10-CM

## 2013-10-20 DIAGNOSIS — C9200 Acute myeloblastic leukemia, not having achieved remission: Secondary | ICD-10-CM

## 2013-10-20 DIAGNOSIS — N189 Chronic kidney disease, unspecified: Secondary | ICD-10-CM

## 2013-10-20 DIAGNOSIS — Z23 Encounter for immunization: Secondary | ICD-10-CM

## 2013-10-20 LAB — COMPREHENSIVE METABOLIC PANEL (CC13)
ALT: 16 U/L (ref 0–55)
AST: 15 U/L (ref 5–34)
Alkaline Phosphatase: 48 U/L (ref 40–150)
Anion Gap: 7 mEq/L (ref 3–11)
CO2: 25 mEq/L (ref 22–29)
Calcium: 9.5 mg/dL (ref 8.4–10.4)
Chloride: 112 mEq/L — ABNORMAL HIGH (ref 98–109)
Creatinine: 1.6 mg/dL — ABNORMAL HIGH (ref 0.7–1.3)

## 2013-10-20 LAB — CBC WITH DIFFERENTIAL/PLATELET
BASO%: 0.9 % (ref 0.0–2.0)
Basophils Absolute: 0 10*3/uL (ref 0.0–0.1)
HCT: 34.7 % — ABNORMAL LOW (ref 38.4–49.9)
HGB: 11.3 g/dL — ABNORMAL LOW (ref 13.0–17.1)
MCHC: 32.4 g/dL (ref 32.0–36.0)
MCV: 89 fL (ref 79.3–98.0)
MONO#: 0.1 10*3/uL (ref 0.1–0.9)
MONO%: 3.8 % (ref 0.0–14.0)
NEUT#: 2.1 10*3/uL (ref 1.5–6.5)
NEUT%: 52.8 % (ref 39.0–75.0)
WBC: 3.9 10*3/uL — ABNORMAL LOW (ref 4.0–10.3)
lymph#: 1.5 10*3/uL (ref 0.9–3.3)

## 2013-10-20 NOTE — Progress Notes (Signed)
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 This is a very pleasant gentleman with prior history of pancytopenia, was first seen in October 2013. Since then, he had multiple bone marrow aspirate and biopsy. His initial bone marrow biopsy in October 2002 showed 20% blast, suspicious for AML. Due to his age, he was offered Vidaza. In May of 2014, repeat bone marrow aspirate and biopsy show his blast count has reduced to about 9%.   INTERVAL HISTORY: Kyle Brewer 79 y.o. male returns for followup prior to cycle 12 of chemotherapy. He tolerated chemotherapy well without reaction to the treatment. The patient denies any recent signs or symptoms of bleeding such as spontaneous epistaxis, hematuria or hematochezia. Marland KitchenHe denies any recent fever, chills, night sweats or abnormal weight loss  I have reviewed the past medical history, past surgical history, social history and family history with the patient and they are unchanged from previous note.  ALLERGIES:  has No Known Allergies.  MEDICATIONS:  Current Outpatient Prescriptions  Medication Sig Dispense Refill  . alendronate (FOSAMAX) 70 MG tablet Take 70 mg by mouth every Monday. Take with a full glass of water on an empty stomach.      Marland Kitchen amLODipine (NORVASC) 5 MG tablet Take 1 tablet (5 mg total) by mouth daily before breakfast.  90 tablet  3  . aspirin 81 MG tablet Take 81 mg by mouth daily.        . Calcium Carbonate-Vitamin D (CALCIUM 600 + D PO) Take 1 tablet by mouth 2 (two) times daily.      .  finasteride (PROSCAR) 5 MG tablet Take 5 mg by mouth daily.      Marland Kitchen levothyroxine (SYNTHROID, LEVOTHROID) 50 MCG tablet Take 50 mcg by mouth every evening.       . lidocaine-prilocaine (EMLA) cream Apply topically as needed.  30 g  2  . lisinopril (PRINIVIL,ZESTRIL) 10 MG tablet Take 10 mg by mouth every evening.       Marland Kitchen LORazepam (ATIVAN) 0.5 MG tablet Take 0.5 mg by mouth at bedtime as needed for anxiety (sleep).       . meloxicam (MOBIC) 7.5 MG tablet Take 7.5 mg by mouth 2 (two) times daily.       . metoprolol succinate (TOPROL-XL) 100 MG 24 hr tablet Take 1 tablet (100 mg total) by mouth daily before breakfast. Take with or immediately following a meal.  90 tablet  3  . Multiple Vitamin (MULTIVITAMIN WITH MINERALS) TABS Take 1 tablet by mouth daily.      . simvastatin (ZOCOR) 20 MG tablet Take 20 mg by mouth at bedtime.        Marland Kitchen terazosin (HYTRIN) 10 MG capsule Take 10 mg by mouth at bedtime.      . vitamin B-12 (CYANOCOBALAMIN) 1000 MCG tablet Take 1,000 mcg by mouth daily.      . vitamin C (ASCORBIC ACID) 500 MG tablet Take 500 mg by mouth daily.       No current facility-administered medications for this visit.    REVIEW OF SYSTEMS:   Constitutional: Denies fevers, chills or abnormal  weight loss Eyes: Denies blurriness of vision Ears, nose, mouth, throat, and face: Denies mucositis or sore throat Respiratory: Denies cough, dyspnea or wheezes Cardiovascular: Denies palpitation, chest discomfort or lower extremity swelling Gastrointestinal:  Denies nausea, heartburn or change in bowel habits Skin: Denies abnormal skin rashes Lymphatics: Denies new lymphadenopathy or easy bruising Neurological:Denies numbness, tingling or new weaknesses Behavioral/Psych: Mood is stable, no new changes  All other systems were reviewed with the patient and are negative.  PHYSICAL EXAMINATION: ECOG PERFORMANCE STATUS: 1 - Symptomatic but completely ambulatory  Filed Vitals:   10/20/13 0937  BP:  152/70  Pulse: 59  Temp: 97 F (36.1 C)  Resp: 18   Filed Weights   10/20/13 0937  Weight: 185 lb 3.2 oz (84.006 kg)    GENERAL:alert, no distress and comfortable. He looks elderly. SKIN: skin color, texture, turgor are normal, no rashes or significant lesions apart from keratosis EYES: normal, Conjunctiva are pink and non-injected, sclera clear OROPHARYNX:no exudate, no erythema and lips, buccal mucosa, and tongue normal  NECK: supple, thyroid normal size, non-tender, without nodularity LYMPH:  no palpable lymphadenopathy in the cervical, axillary or inguinal LUNGS: clear to auscultation and percussion with normal breathing effort HEART: regular rate & rhythm and no murmurs and no lower extremity edema ABDOMEN:abdomen soft, non-tender and normal bowel sounds Musculoskeletal:no cyanosis of digits and no clubbing  NEURO: alert & oriented x 3 with fluent speech, no focal motor/sensory deficits  LABORATORY DATA:  I have reviewed the data as listed    Component Value Date/Time   NA 143 09/22/2013 1243   NA 140 11/04/2012 1201   K 4.4 09/22/2013 1243   K 4.0 11/04/2012 1201   CL 111* 06/18/2013 0854   CL 107 11/04/2012 1201   CO2 23 09/22/2013 1243   CO2 26 11/04/2012 1201   GLUCOSE 90 09/22/2013 1243   GLUCOSE 117* 06/18/2013 0854   GLUCOSE 85 11/04/2012 1201   BUN 26.9* 09/22/2013 1243   BUN 26* 11/04/2012 1201   CREATININE 1.6* 09/22/2013 1243   CREATININE 1.56* 11/04/2012 1201   CALCIUM 9.4 09/22/2013 1243   CALCIUM 9.9 11/04/2012 1201   PROT 6.2* 09/22/2013 1243   ALBUMIN 3.4* 09/22/2013 1243   AST 16 09/22/2013 1243   ALT 16 09/22/2013 1243   ALKPHOS 55 09/22/2013 1243   BILITOT 0.40 09/22/2013 1243   GFRNONAA 39* 11/04/2012 1201   GFRAA 46* 11/04/2012 1201    No results found for this basename: SPEP,  UPEP,   kappa and lambda light chains    Lab Results  Component Value Date   WBC 3.9* 10/20/2013   NEUTROABS 2.1 10/20/2013   HGB 11.3* 10/20/2013   HCT 34.7* 10/20/2013    MCV 89.0 10/20/2013   PLT 174 10/20/2013      Chemistry      Component Value Date/Time   NA 143 09/22/2013 1243   NA 140 11/04/2012 1201   K 4.4 09/22/2013 1243   K 4.0 11/04/2012 1201   CL 111* 06/18/2013 0854   CL 107 11/04/2012 1201   CO2 23 09/22/2013 1243   CO2 26 11/04/2012 1201   BUN 26.9* 09/22/2013 1243   BUN 26* 11/04/2012 1201   CREATININE 1.6* 09/22/2013 1243   CREATININE 1.56* 11/04/2012 1201      Component Value Date/Time   CALCIUM 9.4 09/22/2013 1243   CALCIUM 9.9 11/04/2012 1201   ALKPHOS 55 09/22/2013 1243   AST 16 09/22/2013 1243   ALT 16 09/22/2013 1243  BILITOT 0.40 09/22/2013 1243     ASSESSMENT:  AML, with partial response to treatment  PLAN:  #1 AML He is doing very well. He CBC is new normal. I recommend bone marrow aspirate and biopsy to reassess response to treatment. The patient would like to do a bone marrow biopsy to be done under sedation. I will go ahead and schedule that to be done in 3 weeks. In the meantime would continue on via days #2 anemia This is likely due to recent treatment. The patient denies recent history of bleeding such as epistaxis, hematuria or hematochezia. He is asymptomatic from the anemia. I will observe for now.  He does not require transfusion now. I will continue the chemotherapy at current dose without dosage adjustment.  If the anemia gets progressive worse in the future, I might have to delay his treatment or adjust the chemotherapy dose. #3 chronic kidney disease His creatinine has been stable. In fact is slightly improving. We'll observe.  Orders Placed This Encounter  Procedures  . CBC with Differential    Standing Status: Future     Number of Occurrences:      Standing Expiration Date: 07/12/2014  . Comprehensive metabolic panel    Standing Status: Future     Number of Occurrences:      Standing Expiration Date: 10/20/2014   All questions were answered. The patient knows to call the clinic with any problems,  questions or concerns. No barriers to learning was detected.   Sharonica Kraszewski, MD 10/20/2013 9:49 AM

## 2013-10-20 NOTE — Telephone Encounter (Signed)
Left VM for pt on cell phone informing of BMBx scheduled for 11/13/13,  To arrive at [facility] at 7 am,  NPO after midnight and need a driver.  Informed him Scheduling should be calling him about his chemo schedule for next week.  Asked him to call back to confirm and if any questions.

## 2013-10-20 NOTE — Telephone Encounter (Signed)
gv and pritned appt sched andavs forpt for Dec

## 2013-10-20 NOTE — Telephone Encounter (Signed)
Bone Marrow Biopsy scheduled for 11/13/13 8 am at Short Stay.  Kyle Brewer in [employer] notified.  Pt to arrive at 7 am to [facility].  NPO after Midnight and needs driver home.

## 2013-10-21 ENCOUNTER — Telehealth: Payer: Self-pay | Admitting: Hematology and Oncology

## 2013-10-21 NOTE — Telephone Encounter (Signed)
Added inf appts for mon-fri 11/3 thru 11/7 and mon/tues 11/10 and 11/11. S/w pt he is aware and will get new schedule when he comes in on 11/3. December f/u appt was already on schedule along w/bx for 11/20. No other orders. appts scheduled for staff message from NG. See also 10/27 pof.

## 2013-10-27 ENCOUNTER — Ambulatory Visit (HOSPITAL_BASED_OUTPATIENT_CLINIC_OR_DEPARTMENT_OTHER): Payer: Medicare Other

## 2013-10-27 VITALS — BP 129/68 | HR 63 | Temp 97.8°F | Resp 17

## 2013-10-27 DIAGNOSIS — D46Z Other myelodysplastic syndromes: Secondary | ICD-10-CM

## 2013-10-27 DIAGNOSIS — Z5111 Encounter for antineoplastic chemotherapy: Secondary | ICD-10-CM

## 2013-10-27 DIAGNOSIS — C9200 Acute myeloblastic leukemia, not having achieved remission: Secondary | ICD-10-CM

## 2013-10-27 MED ORDER — AZACITIDINE CHEMO SQ INJECTION
200.0000 mg | Freq: Once | INTRAMUSCULAR | Status: AC
  Administered 2013-10-27: 200 mg via SUBCUTANEOUS
  Filled 2013-10-27: qty 8

## 2013-10-27 MED ORDER — ONDANSETRON HCL 8 MG PO TABS
ORAL_TABLET | ORAL | Status: AC
  Filled 2013-10-27: qty 1

## 2013-10-27 MED ORDER — ONDANSETRON HCL 8 MG PO TABS
8.0000 mg | ORAL_TABLET | Freq: Once | ORAL | Status: AC
  Administered 2013-10-27: 8 mg via ORAL

## 2013-10-27 NOTE — Patient Instructions (Signed)
[facility] Discharge Instructions for Patients Receiving Chemotherapy  Today you received the following chemotherapy agent Vidaza injections.  To help prevent nausea and vomiting after your treatment, we encourage you to take your nausea medication.   If you develop nausea and vomiting that is not controlled by your nausea medication, call the clinic.   BELOW ARE SYMPTOMS THAT SHOULD BE REPORTED IMMEDIATELY:  *FEVER GREATER THAN 100.5 F  *CHILLS WITH OR WITHOUT FEVER  NAUSEA AND VOMITING THAT IS NOT CONTROLLED WITH YOUR NAUSEA MEDICATION  *UNUSUAL SHORTNESS OF BREATH  *UNUSUAL BRUISING OR BLEEDING  TENDERNESS IN MOUTH AND THROAT WITH OR WITHOUT PRESENCE OF ULCERS  *URINARY PROBLEMS  *BOWEL PROBLEMS  UNUSUAL RASH Items with * indicate a potential emergency and should be followed up as soon as possible.  Feel free to call the clinic you have any questions or concerns. The clinic phone number is [phone].   Azacitidine suspension for injection (subcutaneous use) What is this medicine? AZACITIDINE (ay za SITE i deen) is a chemotherapy drug. This medicine reduces the growth of cancer cells and can suppress the immune system. It is used for treating myelodysplastic syndrome or some types of leukemia. This medicine may be used for other purposes; ask your health care provider or pharmacist if you have questions. What should I tell my health care provider before I take this medicine? They need to know if you have any of these conditions: -infection (especially a virus infection such as chickenpox, cold sores, or herpes) -kidney disease -liver disease -liver tumors -an unusual or allergic reaction to azacitidine, mannitol, other medicines, foods, dyes, or preservatives -pregnant or trying to get pregnant -breast-feeding How should I use this medicine? This medicine is for injection under the skin. It is administered in a hospital or clinic by a specially  trained health care professional. Talk to your pediatrician regarding the use of this medicine in children. While this drug may be prescribed for selected conditions, precautions do apply. Overdosage: If you think you have taken too much of this medicine contact a poison control center or emergency room at once. NOTE: This medicine is only for you. Do not share this medicine with others. What if I miss a dose? It is important not to miss your dose. Call your doctor or health care professional if you are unable to keep an appointment. What may interact with this medicine? -vaccines Talk to your doctor or health care professional before taking any of these medicines: -acetaminophen -aspirin -ibuprofen -ketoprofen -naproxen This list may not describe all possible interactions. Give your health care provider a list of all the medicines, herbs, non-prescription drugs, or dietary supplements you use. Also tell them if you smoke, drink alcohol, or use illegal drugs. Some items may interact with your medicine. What should I watch for while using this medicine? Visit your doctor for checks on your progress. This drug may make you feel generally unwell. This is not uncommon, as chemotherapy can affect healthy cells as well as cancer cells. Report any side effects. Continue your course of treatment even though you feel ill unless your doctor tells you to stop. In some cases, you may be given additional medicines to help with side effects. Follow all directions for their use. Call your doctor or health care professional for advice if you get a fever, chills or sore throat, or other symptoms of a cold or flu. Do not treat yourself. This drug decreases your body's ability to fight infections. Try  to avoid being around people who are sick. This medicine may increase your risk to bruise or bleed. Call your doctor or health care professional if you notice any unusual bleeding. Be careful brushing and flossing  your teeth or using a toothpick because you may get an infection or bleed more easily. If you have any dental work done, tell your dentist you are receiving this medicine. Avoid taking products that contain aspirin, acetaminophen, ibuprofen, naproxen, or ketoprofen unless instructed by your doctor. These medicines may hide a fever. Do not have any vaccinations without your doctor's approval and avoid anyone who has recently had oral polio vaccine. Do not become pregnant while taking this medicine. Women should inform their doctor if they wish to become pregnant or think they might be pregnant. There is a potential for serious side effects to an unborn child. Talk to your health care professional or pharmacist for more information. Do not breast-feed an infant while taking this medicine. If you are a man, you should not father a child while receiving treatment. What side effects may I notice from receiving this medicine? Side effects that you should report to your doctor or health care professional as soon as possible: -allergic reactions like skin rash, itching or hives, swelling of the face, lips, or tongue -low blood counts - this medicine may decrease the number of white blood cells, red blood cells and platelets. You may be at increased risk for infections and bleeding. -signs of infection - fever or chills, cough, sore throat, pain or difficulty passing urine -signs of decreased platelets or bleeding - bruising, pinpoint red spots on the skin, black, tarry stools, blood in the urine -signs of decreased red blood cells - unusually weak or tired, fainting spells, lightheadedness -reactions at the injection site including redness, pain, itching, or bruising -breathing problems -changes in vision -fever -mouth sores -stomach pain -vomiting Side effects that usually do not require medical attention (report to your doctor or health care professional if they continue or are  bothersome): -constipation -diarrhea -loss of appetite -nausea -pain or redness at the injection site -weak or tired This list may not describe all possible side effects. Call your doctor for medical advice about side effects. You may report side effects to FDA at 1-800-FDA-1088. Where should I keep my medicine? This drug is given in a hospital or clinic and will not be stored at home. NOTE: This sheet is a summary. It may not cover all possible information. If you have questions about this medicine, talk to your doctor, pharmacist, or health care provider.  2013, [employer]. (03/05/2008 11:04:07 AM)

## 2013-10-28 ENCOUNTER — Ambulatory Visit (HOSPITAL_BASED_OUTPATIENT_CLINIC_OR_DEPARTMENT_OTHER): Payer: Medicare Other

## 2013-10-28 VITALS — BP 104/67 | HR 60 | Temp 98.4°F

## 2013-10-28 DIAGNOSIS — D46Z Other myelodysplastic syndromes: Secondary | ICD-10-CM

## 2013-10-28 DIAGNOSIS — Z5111 Encounter for antineoplastic chemotherapy: Secondary | ICD-10-CM

## 2013-10-28 DIAGNOSIS — C9200 Acute myeloblastic leukemia, not having achieved remission: Secondary | ICD-10-CM

## 2013-10-28 MED ORDER — AZACITIDINE CHEMO SQ INJECTION
200.0000 mg | Freq: Once | INTRAMUSCULAR | Status: AC
  Administered 2013-10-28: 200 mg via SUBCUTANEOUS
  Filled 2013-10-28: qty 8

## 2013-10-28 MED ORDER — ONDANSETRON HCL 8 MG PO TABS
ORAL_TABLET | ORAL | Status: AC
  Filled 2013-10-28: qty 1

## 2013-10-28 MED ORDER — ONDANSETRON HCL 8 MG PO TABS
8.0000 mg | ORAL_TABLET | Freq: Once | ORAL | Status: AC
  Administered 2013-10-28: 8 mg via ORAL

## 2013-10-28 NOTE — Patient Instructions (Signed)
Azacitidine suspension for injection (subcutaneous use) What is this medicine? AZACITIDINE (ay za SITE i deen) is a chemotherapy drug. This medicine reduces the growth of cancer cells and can suppress the immune system. It is used for treating myelodysplastic syndrome or some types of leukemia. This medicine may be used for other purposes; ask your health care provider or pharmacist if you have questions. What should I tell my health care provider before I take this medicine? They need to know if you have any of these conditions: -infection (especially a virus infection such as chickenpox, cold sores, or herpes) -kidney disease -liver disease -liver tumors -an unusual or allergic reaction to azacitidine, mannitol, other medicines, foods, dyes, or preservatives -pregnant or trying to get pregnant -breast-feeding How should I use this medicine? This medicine is for injection under the skin. It is administered in a hospital or clinic by a specially trained health care professional. Talk to your pediatrician regarding the use of this medicine in children. While this drug may be prescribed for selected conditions, precautions do apply. Overdosage: If you think you have taken too much of this medicine contact a poison control center or emergency room at once. NOTE: This medicine is only for you. Do not share this medicine with others. What if I miss a dose? It is important not to miss your dose. Call your doctor or health care professional if you are unable to keep an appointment. What may interact with this medicine? -vaccines Talk to your doctor or health care professional before taking any of these medicines: -acetaminophen -aspirin -ibuprofen -ketoprofen -naproxen This list may not describe all possible interactions. Give your health care provider a list of all the medicines, herbs, non-prescription drugs, or dietary supplements you use. Also tell them if you smoke, drink alcohol, or use  illegal drugs. Some items may interact with your medicine. What should I watch for while using this medicine? Visit your doctor for checks on your progress. This drug may make you feel generally unwell. This is not uncommon, as chemotherapy can affect healthy cells as well as cancer cells. Report any side effects. Continue your course of treatment even though you feel ill unless your doctor tells you to stop. In some cases, you may be given additional medicines to help with side effects. Follow all directions for their use. Call your doctor or health care professional for advice if you get a fever, chills or sore throat, or other symptoms of a cold or flu. Do not treat yourself. This drug decreases your body's ability to fight infections. Try to avoid being around people who are sick. This medicine may increase your risk to bruise or bleed. Call your doctor or health care professional if you notice any unusual bleeding. Be careful brushing and flossing your teeth or using a toothpick because you may get an infection or bleed more easily. If you have any dental work done, tell your dentist you are receiving this medicine. Avoid taking products that contain aspirin, acetaminophen, ibuprofen, naproxen, or ketoprofen unless instructed by your doctor. These medicines may hide a fever. Do not have any vaccinations without your doctor's approval and avoid anyone who has recently had oral polio vaccine. Do not become pregnant while taking this medicine. Women should inform their doctor if they wish to become pregnant or think they might be pregnant. There is a potential for serious side effects to an unborn child. Talk to your health care professional or pharmacist for more information. Do not breast-feed an   infant while taking this medicine. If you are a man, you should not father a child while receiving treatment. What side effects may I notice from receiving this medicine? Side effects that you should report  to your doctor or health care professional as soon as possible: -allergic reactions like skin rash, itching or hives, swelling of the face, lips, or tongue -low blood counts - this medicine may decrease the number of white blood cells, red blood cells and platelets. You may be at increased risk for infections and bleeding. -signs of infection - fever or chills, cough, sore throat, pain or difficulty passing urine -signs of decreased platelets or bleeding - bruising, pinpoint red spots on the skin, black, tarry stools, blood in the urine -signs of decreased red blood cells - unusually weak or tired, fainting spells, lightheadedness -reactions at the injection site including redness, pain, itching, or bruising -breathing problems -changes in vision -fever -mouth sores -stomach pain -vomiting Side effects that usually do not require medical attention (report to your doctor or health care professional if they continue or are bothersome): -constipation -diarrhea -loss of appetite -nausea -pain or redness at the injection site -weak or tired This list may not describe all possible side effects. Call your doctor for medical advice about side effects. You may report side effects to FDA at 1-800-FDA-1088. Where should I keep my medicine? This drug is given in a hospital or clinic and will not be stored at home. NOTE: This sheet is a summary. It may not cover all possible information. If you have questions about this medicine, talk to your doctor, pharmacist, or health care provider.  2013, [employer]. (03/05/2008 11:04:07 AM)  

## 2013-10-29 ENCOUNTER — Ambulatory Visit (HOSPITAL_BASED_OUTPATIENT_CLINIC_OR_DEPARTMENT_OTHER): Payer: Medicare Other

## 2013-10-29 VITALS — BP 113/67 | HR 57 | Temp 98.2°F | Resp 18

## 2013-10-29 DIAGNOSIS — Z5111 Encounter for antineoplastic chemotherapy: Secondary | ICD-10-CM

## 2013-10-29 DIAGNOSIS — D46Z Other myelodysplastic syndromes: Secondary | ICD-10-CM

## 2013-10-29 DIAGNOSIS — C9200 Acute myeloblastic leukemia, not having achieved remission: Secondary | ICD-10-CM

## 2013-10-29 MED ORDER — ONDANSETRON HCL 8 MG PO TABS
ORAL_TABLET | ORAL | Status: AC
  Filled 2013-10-29: qty 1

## 2013-10-29 MED ORDER — AZACITIDINE CHEMO SQ INJECTION
200.0000 mg | Freq: Once | INTRAMUSCULAR | Status: AC
  Administered 2013-10-29: 200 mg via SUBCUTANEOUS
  Filled 2013-10-29: qty 8

## 2013-10-29 MED ORDER — ONDANSETRON HCL 8 MG PO TABS
8.0000 mg | ORAL_TABLET | Freq: Once | ORAL | Status: AC
  Administered 2013-10-29: 8 mg via ORAL

## 2013-10-29 NOTE — Patient Instructions (Signed)
[facility] Discharge Instructions for Patients Receiving Chemotherapy  Today you received the following chemotherapy agents Vidaza. To help prevent nausea and vomiting after your treatment, we encourage you to take your nausea medication as prescribed.    If you develop nausea and vomiting that is not controlled by your nausea medication, call the clinic.   BELOW ARE SYMPTOMS THAT SHOULD BE REPORTED IMMEDIATELY:  *FEVER GREATER THAN 100.5 F  *CHILLS WITH OR WITHOUT FEVER  NAUSEA AND VOMITING THAT IS NOT CONTROLLED WITH YOUR NAUSEA MEDICATION  *UNUSUAL SHORTNESS OF BREATH  *UNUSUAL BRUISING OR BLEEDING  TENDERNESS IN MOUTH AND THROAT WITH OR WITHOUT PRESENCE OF ULCERS  *URINARY PROBLEMS  *BOWEL PROBLEMS  UNUSUAL RASH Items with * indicate a potential emergency and should be followed up as soon as possible.  Feel free to call the clinic you have any questions or concerns. The clinic phone number is [phone].    

## 2013-10-30 ENCOUNTER — Ambulatory Visit (HOSPITAL_BASED_OUTPATIENT_CLINIC_OR_DEPARTMENT_OTHER): Payer: Medicare Other

## 2013-10-30 DIAGNOSIS — C9200 Acute myeloblastic leukemia, not having achieved remission: Secondary | ICD-10-CM

## 2013-10-30 DIAGNOSIS — Z5111 Encounter for antineoplastic chemotherapy: Secondary | ICD-10-CM

## 2013-10-30 DIAGNOSIS — D46Z Other myelodysplastic syndromes: Secondary | ICD-10-CM

## 2013-10-30 MED ORDER — ONDANSETRON HCL 8 MG PO TABS
8.0000 mg | ORAL_TABLET | Freq: Once | ORAL | Status: AC
  Administered 2013-10-30: 8 mg via ORAL

## 2013-10-30 MED ORDER — ONDANSETRON HCL 8 MG PO TABS
ORAL_TABLET | ORAL | Status: AC
  Filled 2013-10-30: qty 1

## 2013-10-30 MED ORDER — AZACITIDINE CHEMO SQ INJECTION
200.0000 mg | Freq: Once | INTRAMUSCULAR | Status: AC
  Administered 2013-10-30: 200 mg via SUBCUTANEOUS
  Filled 2013-10-30: qty 8

## 2013-10-30 NOTE — Patient Instructions (Signed)
[facility] Discharge Instructions for Patients Receiving Chemotherapy  Today you received the following chemotherapy agents Vidaza  To help prevent nausea and vomiting after your treatment, we encourage you to take your nausea medication    If you develop nausea and vomiting that is not controlled by your nausea medication, call the clinic.   BELOW ARE SYMPTOMS THAT SHOULD BE REPORTED IMMEDIATELY:  *FEVER GREATER THAN 100.5 F  *CHILLS WITH OR WITHOUT FEVER  NAUSEA AND VOMITING THAT IS NOT CONTROLLED WITH YOUR NAUSEA MEDICATION  *UNUSUAL SHORTNESS OF BREATH  *UNUSUAL BRUISING OR BLEEDING  TENDERNESS IN MOUTH AND THROAT WITH OR WITHOUT PRESENCE OF ULCERS  *URINARY PROBLEMS  *BOWEL PROBLEMS  UNUSUAL RASH Items with * indicate a potential emergency and should be followed up as soon as possible.  Feel free to call the clinic you have any questions or concerns. The clinic phone number is [phone].    

## 2013-10-31 ENCOUNTER — Ambulatory Visit (HOSPITAL_BASED_OUTPATIENT_CLINIC_OR_DEPARTMENT_OTHER): Payer: Medicare Other

## 2013-10-31 VITALS — BP 118/60 | HR 60 | Temp 96.1°F

## 2013-10-31 DIAGNOSIS — Z5111 Encounter for antineoplastic chemotherapy: Secondary | ICD-10-CM

## 2013-10-31 DIAGNOSIS — C9200 Acute myeloblastic leukemia, not having achieved remission: Secondary | ICD-10-CM

## 2013-10-31 DIAGNOSIS — D46Z Other myelodysplastic syndromes: Secondary | ICD-10-CM

## 2013-10-31 MED ORDER — AZACITIDINE CHEMO SQ INJECTION
200.0000 mg | Freq: Once | INTRAMUSCULAR | Status: AC
  Administered 2013-10-31: 200 mg via SUBCUTANEOUS
  Filled 2013-10-31: qty 8

## 2013-10-31 MED ORDER — ONDANSETRON HCL 8 MG PO TABS
ORAL_TABLET | ORAL | Status: AC
  Filled 2013-10-31: qty 1

## 2013-10-31 MED ORDER — ONDANSETRON HCL 8 MG PO TABS
8.0000 mg | ORAL_TABLET | Freq: Once | ORAL | Status: AC
  Administered 2013-10-31: 8 mg via ORAL

## 2013-10-31 NOTE — Patient Instructions (Signed)
[facility] Discharge Instructions for Patients Receiving Chemotherapy  Today you received the following chemotherapy agents Vidaza. To help prevent nausea and vomiting after your treatment, we encourage you to take your nausea medication as prescribed.    If you develop nausea and vomiting that is not controlled by your nausea medication, call the clinic.   BELOW ARE SYMPTOMS THAT SHOULD BE REPORTED IMMEDIATELY:  *FEVER GREATER THAN 100.5 F  *CHILLS WITH OR WITHOUT FEVER  NAUSEA AND VOMITING THAT IS NOT CONTROLLED WITH YOUR NAUSEA MEDICATION  *UNUSUAL SHORTNESS OF BREATH  *UNUSUAL BRUISING OR BLEEDING  TENDERNESS IN MOUTH AND THROAT WITH OR WITHOUT PRESENCE OF ULCERS  *URINARY PROBLEMS  *BOWEL PROBLEMS  UNUSUAL RASH Items with * indicate a potential emergency and should be followed up as soon as possible.  Feel free to call the clinic you have any questions or concerns. The clinic phone number is [phone].    

## 2013-11-03 ENCOUNTER — Ambulatory Visit (HOSPITAL_BASED_OUTPATIENT_CLINIC_OR_DEPARTMENT_OTHER): Payer: Medicare Other

## 2013-11-03 VITALS — BP 124/69 | HR 75 | Temp 97.9°F

## 2013-11-03 DIAGNOSIS — Z5111 Encounter for antineoplastic chemotherapy: Secondary | ICD-10-CM

## 2013-11-03 DIAGNOSIS — C9200 Acute myeloblastic leukemia, not having achieved remission: Secondary | ICD-10-CM

## 2013-11-03 DIAGNOSIS — D46Z Other myelodysplastic syndromes: Secondary | ICD-10-CM

## 2013-11-03 MED ORDER — AZACITIDINE CHEMO SQ INJECTION
97.0000 mg/m2 | Freq: Once | INTRAMUSCULAR | Status: AC
  Administered 2013-11-03: 200 mg via SUBCUTANEOUS
  Filled 2013-11-03: qty 8

## 2013-11-03 MED ORDER — ONDANSETRON HCL 8 MG PO TABS
8.0000 mg | ORAL_TABLET | Freq: Once | ORAL | Status: AC
  Administered 2013-11-03: 8 mg via ORAL

## 2013-11-03 MED ORDER — ONDANSETRON HCL 8 MG PO TABS
ORAL_TABLET | ORAL | Status: AC
  Filled 2013-11-03: qty 1

## 2013-11-03 NOTE — Patient Instructions (Signed)
[facility] Discharge Instructions for Patients Receiving Chemotherapy  Today you received the following chemotherapy agents Vidaza. To help prevent nausea and vomiting after your treatment, we encourage you to take your nausea medication as prescribed.    If you develop nausea and vomiting that is not controlled by your nausea medication, call the clinic.   BELOW ARE SYMPTOMS THAT SHOULD BE REPORTED IMMEDIATELY:  *FEVER GREATER THAN 100.5 F  *CHILLS WITH OR WITHOUT FEVER  NAUSEA AND VOMITING THAT IS NOT CONTROLLED WITH YOUR NAUSEA MEDICATION  *UNUSUAL SHORTNESS OF BREATH  *UNUSUAL BRUISING OR BLEEDING  TENDERNESS IN MOUTH AND THROAT WITH OR WITHOUT PRESENCE OF ULCERS  *URINARY PROBLEMS  *BOWEL PROBLEMS  UNUSUAL RASH Items with * indicate a potential emergency and should be followed up as soon as possible.  Feel free to call the clinic you have any questions or concerns. The clinic phone number is [phone].    

## 2013-11-04 ENCOUNTER — Ambulatory Visit (HOSPITAL_BASED_OUTPATIENT_CLINIC_OR_DEPARTMENT_OTHER): Payer: Medicare Other

## 2013-11-04 VITALS — BP 108/66 | HR 68 | Temp 98.0°F

## 2013-11-04 DIAGNOSIS — D46Z Other myelodysplastic syndromes: Secondary | ICD-10-CM

## 2013-11-04 DIAGNOSIS — Z5111 Encounter for antineoplastic chemotherapy: Secondary | ICD-10-CM

## 2013-11-04 DIAGNOSIS — C9200 Acute myeloblastic leukemia, not having achieved remission: Secondary | ICD-10-CM

## 2013-11-04 MED ORDER — ONDANSETRON HCL 8 MG PO TABS
ORAL_TABLET | ORAL | Status: AC
  Filled 2013-11-04: qty 1

## 2013-11-04 MED ORDER — ONDANSETRON HCL 8 MG PO TABS
8.0000 mg | ORAL_TABLET | Freq: Once | ORAL | Status: AC
  Administered 2013-11-04: 8 mg via ORAL

## 2013-11-04 MED ORDER — AZACITIDINE CHEMO SQ INJECTION
200.0000 mg | Freq: Once | INTRAMUSCULAR | Status: AC
  Administered 2013-11-04: 200 mg via SUBCUTANEOUS
  Filled 2013-11-04: qty 8

## 2013-11-04 NOTE — Patient Instructions (Signed)
[facility] Discharge Instructions for Patients Receiving Chemotherapy  Today you received the following chemotherapy agents Vidaza  To help prevent nausea and vomiting after your treatment, we encourage you to take your nausea medication    If you develop nausea and vomiting that is not controlled by your nausea medication, call the clinic.   BELOW ARE SYMPTOMS THAT SHOULD BE REPORTED IMMEDIATELY:  *FEVER GREATER THAN 100.5 F  *CHILLS WITH OR WITHOUT FEVER  NAUSEA AND VOMITING THAT IS NOT CONTROLLED WITH YOUR NAUSEA MEDICATION  *UNUSUAL SHORTNESS OF BREATH  *UNUSUAL BRUISING OR BLEEDING  TENDERNESS IN MOUTH AND THROAT WITH OR WITHOUT PRESENCE OF ULCERS  *URINARY PROBLEMS  *BOWEL PROBLEMS  UNUSUAL RASH Items with * indicate a potential emergency and should be followed up as soon as possible.  Feel free to call the clinic you have any questions or concerns. The clinic phone number is [phone].    

## 2013-11-13 ENCOUNTER — Other Ambulatory Visit: Payer: Self-pay | Admitting: Hematology and Oncology

## 2013-11-13 ENCOUNTER — Encounter (HOSPITAL_COMMUNITY): Payer: Self-pay

## 2013-11-13 ENCOUNTER — Ambulatory Visit (HOSPITAL_COMMUNITY)
Admission: RE | Admit: 2013-11-13 | Discharge: 2013-11-13 | Disposition: A | Discharge status: Home / Self Care | Payer: Medicare Other | Source: Ambulatory Visit | Attending: Hematology and Oncology | Admitting: Hematology and Oncology

## 2013-11-13 VITALS — BP 126/85 | HR 66 | Temp 97.9°F | Resp 18 | Ht 68.0 in | Wt 186.0 lb

## 2013-11-13 DIAGNOSIS — D649 Anemia, unspecified: Secondary | ICD-10-CM | POA: Insufficient documentation

## 2013-11-13 DIAGNOSIS — C9200 Acute myeloblastic leukemia, not having achieved remission: Secondary | ICD-10-CM | POA: Insufficient documentation

## 2013-11-13 LAB — CBC WITH DIFFERENTIAL/PLATELET
Basophils Absolute: 0 10*3/uL (ref 0.0–0.1)
Basophils Relative: 1 % (ref 0–1)
Eosinophils Absolute: 0.3 10*3/uL (ref 0.0–0.7)
Hemoglobin: 11.5 g/dL — ABNORMAL LOW (ref 13.0–17.0)
Lymphocytes Relative: 26 % (ref 12–46)
MCH: 29.3 pg (ref 26.0–34.0)
MCHC: 33.8 g/dL (ref 30.0–36.0)
Monocytes Absolute: 0.5 10*3/uL (ref 0.1–1.0)
Monocytes Relative: 8 % (ref 3–12)
Neutrophils Relative %: 61 % (ref 43–77)

## 2013-11-13 LAB — BONE MARROW EXAM: Bone Marrow Exam: 812

## 2013-11-13 MED ORDER — MIDAZOLAM HCL 10 MG/2ML IJ SOLN
10.0000 mg | Freq: Once | INTRAMUSCULAR | Status: DC
  Filled 2013-11-13: qty 2

## 2013-11-13 MED ORDER — MORPHINE SULFATE 10 MG/ML IJ SOLN: 10.0000 mg | Freq: Once | INTRAMUSCULAR | Status: DC

## 2013-11-13 MED ORDER — MORPHINE SULFATE 10 MG/ML IJ SOLN
INTRAMUSCULAR | Status: DC | PRN
  Administered 2013-11-13: 1 mg via INTRAVENOUS

## 2013-11-13 MED ORDER — MIDAZOLAM HCL 2 MG/2ML IJ SOLN
INTRAMUSCULAR | Status: DC | PRN
  Administered 2013-11-13 (×2): 1 mg via INTRAVENOUS

## 2013-11-13 MED ORDER — MORPHINE SULFATE 10 MG/ML IJ SOLN
INTRAMUSCULAR | Status: AC
  Filled 2013-11-13: qty 1

## 2013-11-13 MED ORDER — MIDAZOLAM HCL 5 MG/5ML IJ SOLN
INTRAMUSCULAR | Status: DC | PRN
  Administered 2013-11-13: 2 mg via INTRAVENOUS

## 2013-11-13 NOTE — Progress Notes (Signed)
During the bone marrow procedure, incidentally he was noted to have shingles outbreak at left T6 area. The blisters are in the healing phase, so no antiviral treatment is indicated. Son, Kyle Brewer was informed. We discussed contact precautions

## 2013-11-13 NOTE — ED Notes (Signed)
Family updated as to patient's status.

## 2013-11-13 NOTE — ED Notes (Signed)
bandaid unchanged Pt sitting on edge of bed

## 2013-11-13 NOTE — ED Notes (Signed)
Procedure completed. Pt placed on right side. bandaid to right post iliac area. Small spot of blood on pad of bandaid

## 2013-11-13 NOTE — Progress Notes (Signed)
Pt here today for MD Bone Marrow Biopsy. Pt states he has a portacath but has only been used once in the last year. I did not access it for this procedure. Peripheral IV site was obtained without problem and pre procedure CBC was drawn without problem

## 2013-11-13 NOTE — ED Notes (Signed)
Patient denies pain and is resting comfortably.  

## 2013-11-13 NOTE — ED Notes (Signed)
Patient is resting comfortably. 

## 2013-11-13 NOTE — ED Notes (Signed)
bandaid right post iliac area small spot on bandaid. No swelling or oozing

## 2013-11-13 NOTE — Procedures (Signed)
Brief examination was performed. ENT: adequate airway clearance Heart: regular rate and rhythm.No Murmurs Lungs: clear to auscultation, no wheezes, normal respiratory effort  Bone Marrow Biopsy and Aspiration Procedure Note   Informed consent was obtained and potential risks including bleeding, infection and pain were reviewed with the patient. I verified that the patient has been fasting since midnight.  The patient's name, date of birth, identification, consent and allergies were verified prior to the start of procedure and time out was performed.  A total of 4 mg of IV Versed and 1 mg of IV morphine were given.  The left posterior iliac crest was chosen as the site of biopsy.  The skin was prepped with Betadine solution.   5 cc of 2% lidocaine was used to provide local anaesthesia.   6 cc of bone marrow aspirate was obtained followed by 1 inch biopsy.   The procedure was tolerated well and there were no complications.  The patient was stable at the end of the procedure.  Specimens sent for flow cytometry, cytogenetics and additional studies.

## 2013-11-13 NOTE — ED Notes (Signed)
Ambulated in room with minimal assist and tolerated this well. Able to ambulate to BR with minimal assist. bandaid on post iliac area remains intact with only very small spot of blood on pad

## 2013-11-13 NOTE — Sedation Documentation (Signed)
Medication dose calculated and verified JYN:WGNFAO 4 mg IV and MORPHINE 1 mg IV

## 2013-11-24 ENCOUNTER — Telehealth: Payer: Self-pay | Admitting: Hematology and Oncology

## 2013-11-24 ENCOUNTER — Ambulatory Visit (HOSPITAL_BASED_OUTPATIENT_CLINIC_OR_DEPARTMENT_OTHER): Payer: Medicare Other | Admitting: Hematology and Oncology

## 2013-11-24 ENCOUNTER — Telehealth: Payer: Self-pay | Admitting: *Deleted

## 2013-11-24 ENCOUNTER — Ambulatory Visit: Payer: Medicare Other | Admitting: Internal Medicine

## 2013-11-24 ENCOUNTER — Other Ambulatory Visit (HOSPITAL_BASED_OUTPATIENT_CLINIC_OR_DEPARTMENT_OTHER): Payer: Medicare Other | Admitting: Lab

## 2013-11-24 ENCOUNTER — Encounter: Payer: Self-pay | Admitting: Hematology and Oncology

## 2013-11-24 ENCOUNTER — Encounter (HOSPITAL_COMMUNITY): Payer: Self-pay

## 2013-11-24 ENCOUNTER — Other Ambulatory Visit: Payer: Medicare Other | Admitting: Lab

## 2013-11-24 VITALS — BP 132/83 | HR 73 | Temp 98.6°F | Resp 18 | Ht 68.0 in | Wt 183.1 lb

## 2013-11-24 DIAGNOSIS — D46Z Other myelodysplastic syndromes: Secondary | ICD-10-CM

## 2013-11-24 DIAGNOSIS — C9201 Acute myeloblastic leukemia, in remission: Secondary | ICD-10-CM

## 2013-11-24 DIAGNOSIS — C9200 Acute myeloblastic leukemia, not having achieved remission: Secondary | ICD-10-CM

## 2013-11-24 DIAGNOSIS — N189 Chronic kidney disease, unspecified: Secondary | ICD-10-CM

## 2013-11-24 LAB — CBC WITH DIFFERENTIAL/PLATELET
BASO%: 0.5 % (ref 0.0–2.0)
Basophils Absolute: 0 10e3/uL (ref 0.0–0.1)
EOS%: 2.4 % (ref 0.0–7.0)
Eosinophils Absolute: 0.1 10e3/uL (ref 0.0–0.5)
HCT: 36.9 % — ABNORMAL LOW (ref 38.4–49.9)
HGB: 11.6 g/dL — ABNORMAL LOW (ref 13.0–17.1)
LYMPH%: 34.7 % (ref 14.0–49.0)
MCH: 28.6 pg (ref 27.2–33.4)
MCHC: 31.4 g/dL — ABNORMAL LOW (ref 32.0–36.0)
MCV: 91.3 fL (ref 79.3–98.0)
MONO#: 0.3 10e3/uL (ref 0.1–0.9)
MONO%: 7.2 % (ref 0.0–14.0)
NEUT#: 2.6 10e3/uL (ref 1.5–6.5)
NEUT%: 55.2 % (ref 39.0–75.0)
Platelets: 125 10e3/uL — ABNORMAL LOW (ref 140–400)
RBC: 4.04 10e6/uL — ABNORMAL LOW (ref 4.20–5.82)
RDW: 17.9 % — ABNORMAL HIGH (ref 11.0–14.6)
WBC: 4.8 10e3/uL (ref 4.0–10.3)
lymph#: 1.6 10e3/uL (ref 0.9–3.3)

## 2013-11-24 LAB — CHROMOSOME ANALYSIS, BONE MARROW

## 2013-11-24 LAB — COMPREHENSIVE METABOLIC PANEL (CC13)
AST: 16 U/L (ref 5–34)
Albumin: 3.4 g/dL — ABNORMAL LOW (ref 3.5–5.0)
Alkaline Phosphatase: 51 U/L (ref 40–150)
Anion Gap: 8 mEq/L (ref 3–11)
BUN: 26.5 mg/dL — ABNORMAL HIGH (ref 7.0–26.0)
Creatinine: 1.6 mg/dL — ABNORMAL HIGH (ref 0.7–1.3)

## 2013-11-24 LAB — TISSUE HYBRIDIZATION (BONE MARROW)-NCBH

## 2013-11-24 NOTE — Telephone Encounter (Signed)
Tx added Dec and Jan cals mailed to pt [facility]

## 2013-11-24 NOTE — Telephone Encounter (Signed)
Per staff message and POF I have scheduled appts.  JMW  

## 2013-11-24 NOTE — Progress Notes (Signed)
[facility] OFFICE PROGRESS NOTE  Patient Care Team: Gwen Pounds, MD as PCP - General Valetta Fuller, MD (Urology) Meryl Dare, MD (Gastroenterology) Kathleene Hazel, MD (Cardiology) Artis Delay, MD as Consulting Physician (Hematology and Oncology)  DIAGNOSIS: AML, in remission  SUMMARY OF ONCOLOGIC HISTORY: Seen the patient, collaborated the history with him and review his records. This is a very pleasant gentleman with prior history of pancytopenia, was first seen in October 2013. Since then, he had multiple bone marrow aspirate and biopsy. His initial bone marrow biopsy in October 2002 showed 20% blast, suspicious for AML. Due to his age, he was offered Vidaza. In May of 2014, repeat bone marrow aspirate and biopsy show his blast count has reduced to about 9%. In November 2014, repeat bone marrow aspirate and biopsy confirmed complete remission  INTERVAL HISTORY: Kyle Brewer 79 y.o. male returns for further followup. He is doing well without side effects from treatment. He denies any recent fever, chills, night sweats or abnormal weight loss The patient denies any recent signs or symptoms of bleeding such as spontaneous epistaxis, hematuria or hematochezia.  I have reviewed the past medical history, past surgical history, social history and family history with the patient and they are unchanged from previous note.  ALLERGIES:  has No Known Allergies.  MEDICATIONS:  Current Outpatient Prescriptions  Medication Sig Dispense Refill  . alendronate (FOSAMAX) 70 MG tablet Take 70 mg by mouth every Monday. Take with a full glass of water on an empty stomach.      Marland Kitchen amLODipine (NORVASC) 5 MG tablet Take 1 tablet (5 mg total) by mouth daily before breakfast.  90 tablet  3  . aspirin 81 MG tablet Take 81 mg by mouth daily.        . Calcium Carbonate-Vitamin D (CALCIUM 600 + D PO) Take 1 tablet by mouth 2 (two) times daily.      . finasteride (PROSCAR) 5 MG tablet  Take 5 mg by mouth daily.      Marland Kitchen levothyroxine (SYNTHROID, LEVOTHROID) 50 MCG tablet Take 50 mcg by mouth every evening.       Marland Kitchen lisinopril (PRINIVIL,ZESTRIL) 10 MG tablet Take 10 mg by mouth every evening.       Marland Kitchen LORazepam (ATIVAN) 0.5 MG tablet Take 0.5 mg by mouth at bedtime as needed for anxiety (sleep).       . meloxicam (MOBIC) 7.5 MG tablet Take 7.5 mg by mouth 2 (two) times daily.       . metoprolol succinate (TOPROL-XL) 100 MG 24 hr tablet Take 1 tablet (100 mg total) by mouth daily before breakfast. Take with or immediately following a meal.  90 tablet  3  . Multiple Vitamin (MULTIVITAMIN WITH MINERALS) TABS Take 1 tablet by mouth daily.      . simvastatin (ZOCOR) 20 MG tablet Take 20 mg by mouth at bedtime.        Marland Kitchen terazosin (HYTRIN) 10 MG capsule Take 10 mg by mouth at bedtime.      . vitamin B-12 (CYANOCOBALAMIN) 1000 MCG tablet Take 1,000 mcg by mouth daily.      . vitamin C (ASCORBIC ACID) 500 MG tablet Take 500 mg by mouth daily.       No current facility-administered medications for this visit.    REVIEW OF SYSTEMS:   Eyes: Denies blurriness of vision Ears, nose, mouth, throat, and face: Denies mucositis or sore throat Respiratory: Denies cough, dyspnea or wheezes  Cardiovascular: Denies palpitation, chest discomfort or lower extremity swelling Gastrointestinal:  Denies nausea, heartburn or change in bowel habits Skin: Denies abnormal skin rashes Lymphatics: Denies new lymphadenopathy or easy bruising Neurological:Denies numbness, tingling or new weaknesses Behavioral/Psych: Mood is stable, no new changes  All other systems were reviewed with the patient and are negative.  PHYSICAL EXAMINATION: ECOG PERFORMANCE STATUS: 0 - Asymptomatic  Filed Vitals:   11/24/13 0840  BP: 132/83  Pulse: 73  Temp: 98.6 F (37 C)  Resp: 18   Filed Weights   11/24/13 0840  Weight: 183 lb 1.6 oz (83.054 kg)    GENERAL:alert, no distress and comfortable SKIN: skin color,  texture, turgor are normal, no rashes or significant lesions EYES: normal, Conjunctiva are pink and non-injected, sclera clear OROPHARYNX:no exudate, no erythema and lips, buccal mucosa, and tongue normal  NECK: supple, thyroid normal size, non-tender, without nodularity LYMPH:  no palpable lymphadenopathy in the cervical, axillary or inguinal LUNGS: clear to auscultation and percussion with normal breathing effort HEART: regular rate & rhythm and no murmurs and no lower extremity edema ABDOMEN:abdomen soft, non-tender and normal bowel sounds Musculoskeletal:no cyanosis of digits and no clubbing  NEURO: alert & oriented x 3 with fluent speech, no focal motor/sensory deficits  LABORATORY DATA:  I have reviewed the data as listed    Component Value Date/Time   NA 144 10/20/2013 0920   NA 140 11/04/2012 1201   K 4.5 10/20/2013 0920   K 4.0 11/04/2012 1201   CL 111* 06/18/2013 0854   CL 107 11/04/2012 1201   CO2 25 10/20/2013 0920   CO2 26 11/04/2012 1201   GLUCOSE 93 10/20/2013 0920   GLUCOSE 117* 06/18/2013 0854   GLUCOSE 85 11/04/2012 1201   BUN 30.6* 10/20/2013 0920   BUN 26* 11/04/2012 1201   CREATININE 1.6* 10/20/2013 0920   CREATININE 1.56* 11/04/2012 1201   CALCIUM 9.5 10/20/2013 0920   CALCIUM 9.9 11/04/2012 1201   PROT 6.1* 10/20/2013 0920   ALBUMIN 3.1* 10/20/2013 0920   AST 15 10/20/2013 0920   ALT 16 10/20/2013 0920   ALKPHOS 48 10/20/2013 0920   BILITOT 0.45 10/20/2013 0920   GFRNONAA 39* 11/04/2012 1201   GFRAA 46* 11/04/2012 1201    No results found for this basename: SPEP,  UPEP,   kappa and lambda light chains    Lab Results  Component Value Date   WBC 4.8 11/24/2013   NEUTROABS 2.6 11/24/2013   HGB 11.6* 11/24/2013   HCT 36.9* 11/24/2013   MCV 91.3 11/24/2013   PLT 125* 11/24/2013      Chemistry      Component Value Date/Time   NA 144 10/20/2013 0920   NA 140 11/04/2012 1201   K 4.5 10/20/2013 0920   K 4.0 11/04/2012 1201   CL 111* 06/18/2013 0854    CL 107 11/04/2012 1201   CO2 25 10/20/2013 0920   CO2 26 11/04/2012 1201   BUN 30.6* 10/20/2013 0920   BUN 26* 11/04/2012 1201   CREATININE 1.6* 10/20/2013 0920   CREATININE 1.56* 11/04/2012 1201      Component Value Date/Time   CALCIUM 9.5 10/20/2013 0920   CALCIUM 9.9 11/04/2012 1201   ALKPHOS 48 10/20/2013 0920   AST 15 10/20/2013 0920   ALT 16 10/20/2013 0920   BILITOT 0.45 10/20/2013 0920      I reviewed his bone marrow aspirate and biopsy which confirmed he is in remission   ASSESSMENT & PLAN:  #1 AML The patient  has achieved complete remission with chemotherapy. The patient is developing slight pancytopenia with treatment. I will resume his treatment next week and reduced to treatment days to 5 days once every 28 days. He agreed with the plan. My plan would be to continue chemotherapy indefinitely. As well as he continue to response to treatment, we will continue to administer chemotherapy 5 days every 28 days. I would repeat bone marrow aspirate and biopsy again in 6 months. #2 chronic kidney disease His creatinine is stable. We'll continue observation only.  Orders Placed This Encounter  Procedures  . CBC with Differential    Standing Status: Standing     Number of Occurrences: 9     Standing Expiration Date: 11/24/2014  . Comprehensive metabolic panel    Standing Status: Standing     Number of Occurrences: 9     Standing Expiration Date: 11/24/2014   All questions were answered. The patient knows to call the clinic with any problems, questions or concerns. No barriers to learning was detected.   Sinahi Knights, MD 11/24/2013 8:56 AM

## 2013-11-24 NOTE — Telephone Encounter (Signed)
Appts made per 12/1 POF Email to MW Told pt I would mail new cal once tx added shh

## 2013-12-01 ENCOUNTER — Other Ambulatory Visit (HOSPITAL_BASED_OUTPATIENT_CLINIC_OR_DEPARTMENT_OTHER): Payer: Medicare Other | Admitting: Lab

## 2013-12-01 ENCOUNTER — Ambulatory Visit (HOSPITAL_BASED_OUTPATIENT_CLINIC_OR_DEPARTMENT_OTHER): Payer: Medicare Other

## 2013-12-01 VITALS — BP 148/78 | HR 64 | Temp 97.1°F | Resp 18

## 2013-12-01 DIAGNOSIS — C9200 Acute myeloblastic leukemia, not having achieved remission: Secondary | ICD-10-CM

## 2013-12-01 DIAGNOSIS — D46Z Other myelodysplastic syndromes: Secondary | ICD-10-CM

## 2013-12-01 DIAGNOSIS — Z5111 Encounter for antineoplastic chemotherapy: Secondary | ICD-10-CM

## 2013-12-01 DIAGNOSIS — C9201 Acute myeloblastic leukemia, in remission: Secondary | ICD-10-CM

## 2013-12-01 LAB — CBC WITH DIFFERENTIAL/PLATELET
BASO%: 0.7 % (ref 0.0–2.0)
Basophils Absolute: 0 10*3/uL (ref 0.0–0.1)
Eosinophils Absolute: 0.1 10*3/uL (ref 0.0–0.5)
HGB: 11.5 g/dL — ABNORMAL LOW (ref 13.0–17.1)
LYMPH%: 35.5 % (ref 14.0–49.0)
MCHC: 33 g/dL (ref 32.0–36.0)
MCV: 86.4 fL (ref 79.3–98.0)
MONO#: 0.3 10*3/uL (ref 0.1–0.9)
MONO%: 7.6 % (ref 0.0–14.0)
NEUT#: 2.2 10*3/uL (ref 1.5–6.5)
Platelets: 210 10*3/uL (ref 140–400)
RDW: 16.6 % — ABNORMAL HIGH (ref 11.0–14.6)
WBC: 4.1 10*3/uL (ref 4.0–10.3)
lymph#: 1.5 10*3/uL (ref 0.9–3.3)

## 2013-12-01 LAB — COMPREHENSIVE METABOLIC PANEL (CC13)
Albumin: 3.3 g/dL — ABNORMAL LOW (ref 3.5–5.0)
Anion Gap: 8 mEq/L (ref 3–11)
CO2: 25 mEq/L (ref 22–29)
Calcium: 9.3 mg/dL (ref 8.4–10.4)
Chloride: 111 mEq/L — ABNORMAL HIGH (ref 98–109)
Creatinine: 1.9 mg/dL — ABNORMAL HIGH (ref 0.7–1.3)
Glucose: 73 mg/dl (ref 70–140)
Potassium: 4.5 mEq/L (ref 3.5–5.1)
Sodium: 144 mEq/L (ref 136–145)
Total Bilirubin: 0.43 mg/dL (ref 0.20–1.20)
Total Protein: 6.3 g/dL — ABNORMAL LOW (ref 6.4–8.3)

## 2013-12-01 MED ORDER — AZACITIDINE CHEMO SQ INJECTION
72.0000 mg/m2 | Freq: Once | INTRAMUSCULAR | Status: AC
  Administered 2013-12-01: 150 mg via SUBCUTANEOUS
  Filled 2013-12-01: qty 6

## 2013-12-01 MED ORDER — ONDANSETRON HCL 8 MG PO TABS
ORAL_TABLET | ORAL | Status: AC
  Filled 2013-12-01: qty 1

## 2013-12-01 MED ORDER — ONDANSETRON HCL 8 MG PO TABS
8.0000 mg | ORAL_TABLET | Freq: Once | ORAL | Status: AC
  Administered 2013-12-01: 8 mg via ORAL

## 2013-12-01 NOTE — Patient Instructions (Signed)
[facility] Discharge Instructions for Patients Receiving Chemotherapy  Today you received the following chemotherapy agents Vidaza. To help prevent nausea and vomiting after your treatment, we encourage you to take your nausea medication as prescribed.    If you develop nausea and vomiting that is not controlled by your nausea medication, call the clinic.   BELOW ARE SYMPTOMS THAT SHOULD BE REPORTED IMMEDIATELY:  *FEVER GREATER THAN 100.5 F  *CHILLS WITH OR WITHOUT FEVER  NAUSEA AND VOMITING THAT IS NOT CONTROLLED WITH YOUR NAUSEA MEDICATION  *UNUSUAL SHORTNESS OF BREATH  *UNUSUAL BRUISING OR BLEEDING  TENDERNESS IN MOUTH AND THROAT WITH OR WITHOUT PRESENCE OF ULCERS  *URINARY PROBLEMS  *BOWEL PROBLEMS  UNUSUAL RASH Items with * indicate a potential emergency and should be followed up as soon as possible.  Feel free to call the clinic you have any questions or concerns. The clinic phone number is [phone].    

## 2013-12-02 ENCOUNTER — Telehealth: Payer: Self-pay | Admitting: Hematology and Oncology

## 2013-12-02 ENCOUNTER — Ambulatory Visit (HOSPITAL_BASED_OUTPATIENT_CLINIC_OR_DEPARTMENT_OTHER): Payer: Medicare Other

## 2013-12-02 ENCOUNTER — Other Ambulatory Visit: Payer: Self-pay | Admitting: Hematology and Oncology

## 2013-12-02 VITALS — BP 137/104 | HR 62 | Temp 98.2°F | Resp 18

## 2013-12-02 DIAGNOSIS — C9200 Acute myeloblastic leukemia, not having achieved remission: Secondary | ICD-10-CM

## 2013-12-02 DIAGNOSIS — Z5111 Encounter for antineoplastic chemotherapy: Secondary | ICD-10-CM

## 2013-12-02 DIAGNOSIS — D46Z Other myelodysplastic syndromes: Secondary | ICD-10-CM

## 2013-12-02 DIAGNOSIS — C9201 Acute myeloblastic leukemia, in remission: Secondary | ICD-10-CM

## 2013-12-02 MED ORDER — ONDANSETRON HCL 8 MG PO TABS
ORAL_TABLET | ORAL | Status: AC
  Filled 2013-12-02: qty 1

## 2013-12-02 MED ORDER — ONDANSETRON HCL 8 MG PO TABS
8.0000 mg | ORAL_TABLET | Freq: Once | ORAL | Status: AC
  Administered 2013-12-02: 8 mg via ORAL

## 2013-12-02 MED ORDER — AZACITIDINE CHEMO SQ INJECTION
75.0000 mg/m2 | Freq: Once | INTRAMUSCULAR | Status: AC
  Administered 2013-12-02: 150 mg via SUBCUTANEOUS
  Filled 2013-12-02: qty 6

## 2013-12-02 NOTE — Telephone Encounter (Signed)
pt called wanting to know if his tx for tomorrow could be changed to 10 am.  I advised pt Kyle Brewer gone for day and unlikely he could do this since such late notice given (pt called at 4:11 pm).  Pt will try to resolve appt conflict and be here tomorrow

## 2013-12-02 NOTE — Patient Instructions (Signed)
Azacitidine suspension for injection (subcutaneous use)  What is this medicine?  AZACITIDINE (ay za SITE i deen) is a chemotherapy drug. This medicine reduces the growth of cancer cells and can suppress the immune system. It is used for treating myelodysplastic syndrome or some types of leukemia.  This medicine may be used for other purposes; ask your health care provider or pharmacist if you have questions.  COMMON BRAND NAME(S): Vidaza  What should I tell my health care provider before I take this medicine?  They need to know if you have any of these conditions:  -infection (especially a virus infection such as chickenpox, cold sores, or herpes)  -kidney disease  -liver disease  -liver tumors  -an unusual or allergic reaction to azacitidine, mannitol, other medicines, foods, dyes, or preservatives  -pregnant or trying to get pregnant  -breast-feeding  How should I use this medicine?  This medicine is for injection under the skin. It is administered in a hospital or clinic by a specially trained health care professional.  Talk to your pediatrician regarding the use of this medicine in children. While this drug may be prescribed for selected conditions, precautions do apply.  Overdosage: If you think you have taken too much of this medicine contact a poison control center or emergency room at once.  NOTE: This medicine is only for you. Do not share this medicine with others.  What if I miss a dose?  It is important not to miss your dose. Call your doctor or health care professional if you are unable to keep an appointment.  What may interact with this medicine?  -vaccines  Talk to your doctor or health care professional before taking any of these medicines:  -acetaminophen  -aspirin  -ibuprofen  -ketoprofen  -naproxen  This list may not describe all possible interactions. Give your health care provider a list of all the medicines, herbs, non-prescription drugs, or dietary supplements you use. Also tell them if you  smoke, drink alcohol, or use illegal drugs. Some items may interact with your medicine.  What should I watch for while using this medicine?  Visit your doctor for checks on your progress. This drug may make you feel generally unwell. This is not uncommon, as chemotherapy can affect healthy cells as well as cancer cells. Report any side effects. Continue your course of treatment even though you feel ill unless your doctor tells you to stop.  In some cases, you may be given additional medicines to help with side effects. Follow all directions for their use.  Call your doctor or health care professional for advice if you get a fever, chills or sore throat, or other symptoms of a cold or flu. Do not treat yourself. This drug decreases your body's ability to fight infections. Try to avoid being around people who are sick.  This medicine may increase your risk to bruise or bleed. Call your doctor or health care professional if you notice any unusual bleeding.  Be careful brushing and flossing your teeth or using a toothpick because you may get an infection or bleed more easily. If you have any dental work done, tell your dentist you are receiving this medicine.  Avoid taking products that contain aspirin, acetaminophen, ibuprofen, naproxen, or ketoprofen unless instructed by your doctor. These medicines may hide a fever.  Do not have any vaccinations without your doctor's approval and avoid anyone who has recently had oral polio vaccine.  Do not become pregnant while taking this medicine. Women should   inform their doctor if they wish to become pregnant or think they might be pregnant. There is a potential for serious side effects to an unborn child. Talk to your health care professional or pharmacist for more information. Do not breast-feed an infant while taking this medicine.  If you are a man, you should not father a child while receiving treatment.  What side effects may I notice from receiving this medicine?  Side  effects that you should report to your doctor or health care professional as soon as possible:  -allergic reactions like skin rash, itching or hives, swelling of the face, lips, or tongue  -low blood counts - this medicine may decrease the number of white blood cells, red blood cells and platelets. You may be at increased risk for infections and bleeding.  -signs of infection - fever or chills, cough, sore throat, pain or difficulty passing urine  -signs of decreased platelets or bleeding - bruising, pinpoint red spots on the skin, black, tarry stools, blood in the urine  -signs of decreased red blood cells - unusually weak or tired, fainting spells, lightheadedness  -reactions at the injection site including redness, pain, itching, or bruising  -breathing problems  -changes in vision  -fever  -mouth sores  -stomach pain  -vomiting  Side effects that usually do not require medical attention (report to your doctor or health care professional if they continue or are bothersome):  -constipation  -diarrhea  -loss of appetite  -nausea  -pain or redness at the injection site  -weak or tired  This list may not describe all possible side effects. Call your doctor for medical advice about side effects. You may report side effects to FDA at 1-800-FDA-1088.  Where should I keep my medicine?  This drug is given in a hospital or clinic and will not be stored at home.  NOTE: This sheet is a summary. It may not cover all possible information. If you have questions about this medicine, talk to your doctor, pharmacist, or health care provider.  © 2014, [employer]. (2008-03-05 11:04:07)

## 2013-12-03 ENCOUNTER — Ambulatory Visit (HOSPITAL_BASED_OUTPATIENT_CLINIC_OR_DEPARTMENT_OTHER): Payer: Medicare Other

## 2013-12-03 VITALS — BP 100/62 | HR 61 | Temp 98.5°F | Resp 16

## 2013-12-03 DIAGNOSIS — C9200 Acute myeloblastic leukemia, not having achieved remission: Secondary | ICD-10-CM

## 2013-12-03 DIAGNOSIS — C9201 Acute myeloblastic leukemia, in remission: Secondary | ICD-10-CM

## 2013-12-03 DIAGNOSIS — D46Z Other myelodysplastic syndromes: Secondary | ICD-10-CM

## 2013-12-03 DIAGNOSIS — Z5111 Encounter for antineoplastic chemotherapy: Secondary | ICD-10-CM

## 2013-12-03 MED ORDER — AZACITIDINE CHEMO SQ INJECTION
75.0000 mg/m2 | Freq: Once | INTRAMUSCULAR | Status: AC
  Administered 2013-12-03: 150 mg via SUBCUTANEOUS
  Filled 2013-12-03: qty 6

## 2013-12-03 MED ORDER — ONDANSETRON HCL 8 MG PO TABS
8.0000 mg | ORAL_TABLET | Freq: Once | ORAL | Status: AC
  Administered 2013-12-03: 8 mg via ORAL

## 2013-12-03 MED ORDER — ONDANSETRON HCL 8 MG PO TABS
ORAL_TABLET | ORAL | Status: AC
  Filled 2013-12-03: qty 1

## 2013-12-03 NOTE — Patient Instructions (Signed)
Azacitidine suspension for injection (subcutaneous use)  What is this medicine?  AZACITIDINE (ay za SITE i deen) is a chemotherapy drug. This medicine reduces the growth of cancer cells and can suppress the immune system. It is used for treating myelodysplastic syndrome or some types of leukemia.  This medicine may be used for other purposes; ask your health care provider or pharmacist if you have questions.  COMMON BRAND NAME(S): Vidaza  What should I tell my health care provider before I take this medicine?  They need to know if you have any of these conditions:  -infection (especially a virus infection such as chickenpox, cold sores, or herpes)  -kidney disease  -liver disease  -liver tumors  -an unusual or allergic reaction to azacitidine, mannitol, other medicines, foods, dyes, or preservatives  -pregnant or trying to get pregnant  -breast-feeding  How should I use this medicine?  This medicine is for injection under the skin. It is administered in a hospital or clinic by a specially trained health care professional.  Talk to your pediatrician regarding the use of this medicine in children. While this drug may be prescribed for selected conditions, precautions do apply.  Overdosage: If you think you have taken too much of this medicine contact a poison control center or emergency room at once.  NOTE: This medicine is only for you. Do not share this medicine with others.  What if I miss a dose?  It is important not to miss your dose. Call your doctor or health care professional if you are unable to keep an appointment.  What may interact with this medicine?  -vaccines  Talk to your doctor or health care professional before taking any of these medicines:  -acetaminophen  -aspirin  -ibuprofen  -ketoprofen  -naproxen  This list may not describe all possible interactions. Give your health care provider a list of all the medicines, herbs, non-prescription drugs, or dietary supplements you use. Also tell them if you  smoke, drink alcohol, or use illegal drugs. Some items may interact with your medicine.  What should I watch for while using this medicine?  Visit your doctor for checks on your progress. This drug may make you feel generally unwell. This is not uncommon, as chemotherapy can affect healthy cells as well as cancer cells. Report any side effects. Continue your course of treatment even though you feel ill unless your doctor tells you to stop.  In some cases, you may be given additional medicines to help with side effects. Follow all directions for their use.  Call your doctor or health care professional for advice if you get a fever, chills or sore throat, or other symptoms of a cold or flu. Do not treat yourself. This drug decreases your body's ability to fight infections. Try to avoid being around people who are sick.  This medicine may increase your risk to bruise or bleed. Call your doctor or health care professional if you notice any unusual bleeding.  Be careful brushing and flossing your teeth or using a toothpick because you may get an infection or bleed more easily. If you have any dental work done, tell your dentist you are receiving this medicine.  Avoid taking products that contain aspirin, acetaminophen, ibuprofen, naproxen, or ketoprofen unless instructed by your doctor. These medicines may hide a fever.  Do not have any vaccinations without your doctor's approval and avoid anyone who has recently had oral polio vaccine.  Do not become pregnant while taking this medicine. Women should   inform their doctor if they wish to become pregnant or think they might be pregnant. There is a potential for serious side effects to an unborn child. Talk to your health care professional or pharmacist for more information. Do not breast-feed an infant while taking this medicine.  If you are a man, you should not father a child while receiving treatment.  What side effects may I notice from receiving this medicine?  Side  effects that you should report to your doctor or health care professional as soon as possible:  -allergic reactions like skin rash, itching or hives, swelling of the face, lips, or tongue  -low blood counts - this medicine may decrease the number of white blood cells, red blood cells and platelets. You may be at increased risk for infections and bleeding.  -signs of infection - fever or chills, cough, sore throat, pain or difficulty passing urine  -signs of decreased platelets or bleeding - bruising, pinpoint red spots on the skin, black, tarry stools, blood in the urine  -signs of decreased red blood cells - unusually weak or tired, fainting spells, lightheadedness  -reactions at the injection site including redness, pain, itching, or bruising  -breathing problems  -changes in vision  -fever  -mouth sores  -stomach pain  -vomiting  Side effects that usually do not require medical attention (report to your doctor or health care professional if they continue or are bothersome):  -constipation  -diarrhea  -loss of appetite  -nausea  -pain or redness at the injection site  -weak or tired  This list may not describe all possible side effects. Call your doctor for medical advice about side effects. You may report side effects to FDA at 1-800-FDA-1088.  Where should I keep my medicine?  This drug is given in a hospital or clinic and will not be stored at home.  NOTE: This sheet is a summary. It may not cover all possible information. If you have questions about this medicine, talk to your doctor, pharmacist, or health care provider.  © 2014, [employer]. (2008-03-05 11:04:07)

## 2013-12-04 ENCOUNTER — Ambulatory Visit (HOSPITAL_BASED_OUTPATIENT_CLINIC_OR_DEPARTMENT_OTHER): Payer: Medicare Other

## 2013-12-04 VITALS — BP 105/60 | HR 70 | Temp 97.8°F | Resp 16

## 2013-12-04 DIAGNOSIS — Z5111 Encounter for antineoplastic chemotherapy: Secondary | ICD-10-CM

## 2013-12-04 DIAGNOSIS — C9201 Acute myeloblastic leukemia, in remission: Secondary | ICD-10-CM

## 2013-12-04 DIAGNOSIS — C9200 Acute myeloblastic leukemia, not having achieved remission: Secondary | ICD-10-CM

## 2013-12-04 DIAGNOSIS — D46Z Other myelodysplastic syndromes: Secondary | ICD-10-CM

## 2013-12-04 MED ORDER — ONDANSETRON HCL 8 MG PO TABS
8.0000 mg | ORAL_TABLET | Freq: Once | ORAL | Status: AC
  Administered 2013-12-04: 8 mg via ORAL

## 2013-12-04 MED ORDER — AZACITIDINE CHEMO SQ INJECTION
75.0000 mg/m2 | Freq: Once | INTRAMUSCULAR | Status: AC
  Administered 2013-12-04: 150 mg via SUBCUTANEOUS
  Filled 2013-12-04: qty 6

## 2013-12-04 MED ORDER — ONDANSETRON HCL 8 MG PO TABS
ORAL_TABLET | ORAL | Status: AC
  Filled 2013-12-04: qty 1

## 2013-12-04 NOTE — Patient Instructions (Signed)
[facility] Discharge Instructions for Patients Receiving Chemotherapy  Today you received the following chemotherapy agents Vidaza,.  To help prevent nausea and vomiting after your treatment, we encourage you to take your nausea medication as prescribed.   If you develop nausea and vomiting that is not controlled by your nausea medication, call the clinic.   BELOW ARE SYMPTOMS THAT SHOULD BE REPORTED IMMEDIATELY:  *FEVER GREATER THAN 100.5 F  *CHILLS WITH OR WITHOUT FEVER  NAUSEA AND VOMITING THAT IS NOT CONTROLLED WITH YOUR NAUSEA MEDICATION  *UNUSUAL SHORTNESS OF BREATH  *UNUSUAL BRUISING OR BLEEDING  TENDERNESS IN MOUTH AND THROAT WITH OR WITHOUT PRESENCE OF ULCERS  *URINARY PROBLEMS  *BOWEL PROBLEMS  UNUSUAL RASH Items with * indicate a potential emergency and should be followed up as soon as possible.  Feel free to call the clinic you have any questions or concerns. The clinic phone number is [phone].

## 2013-12-05 ENCOUNTER — Ambulatory Visit (HOSPITAL_BASED_OUTPATIENT_CLINIC_OR_DEPARTMENT_OTHER): Payer: Medicare Other

## 2013-12-05 VITALS — BP 113/65 | HR 63 | Temp 97.7°F

## 2013-12-05 DIAGNOSIS — Z5111 Encounter for antineoplastic chemotherapy: Secondary | ICD-10-CM

## 2013-12-05 DIAGNOSIS — C9200 Acute myeloblastic leukemia, not having achieved remission: Secondary | ICD-10-CM

## 2013-12-05 DIAGNOSIS — D46Z Other myelodysplastic syndromes: Secondary | ICD-10-CM

## 2013-12-05 DIAGNOSIS — C9201 Acute myeloblastic leukemia, in remission: Secondary | ICD-10-CM

## 2013-12-05 MED ORDER — ONDANSETRON HCL 8 MG PO TABS
8.0000 mg | ORAL_TABLET | Freq: Once | ORAL | Status: AC
  Administered 2013-12-05: 8 mg via ORAL

## 2013-12-05 MED ORDER — ONDANSETRON HCL 8 MG PO TABS
ORAL_TABLET | ORAL | Status: AC
  Filled 2013-12-05: qty 1

## 2013-12-05 MED ORDER — AZACITIDINE CHEMO SQ INJECTION
75.0000 mg/m2 | Freq: Once | INTRAMUSCULAR | Status: AC
  Administered 2013-12-05: 150 mg via SUBCUTANEOUS
  Filled 2013-12-05: qty 6

## 2013-12-05 NOTE — Patient Instructions (Signed)
[facility] Discharge Instructions for Patients Receiving Chemotherapy  Today you received the following chemotherapy agents Vidaza.  To help prevent nausea and vomiting after your treatment, we encourage you to take your nausea medication if needed.   If you develop nausea and vomiting that is not controlled by your nausea medication, call the clinic.   BELOW ARE SYMPTOMS THAT SHOULD BE REPORTED IMMEDIATELY:  *FEVER GREATER THAN 100.5 F  *CHILLS WITH OR WITHOUT FEVER  NAUSEA AND VOMITING THAT IS NOT CONTROLLED WITH YOUR NAUSEA MEDICATION  *UNUSUAL SHORTNESS OF BREATH  *UNUSUAL BRUISING OR BLEEDING  TENDERNESS IN MOUTH AND THROAT WITH OR WITHOUT PRESENCE OF ULCERS  *URINARY PROBLEMS  *BOWEL PROBLEMS  UNUSUAL RASH Items with * indicate a potential emergency and should be followed up as soon as possible.  Feel free to call the clinic you have any questions or concerns. The clinic phone number is [phone].

## 2013-12-22 ENCOUNTER — Other Ambulatory Visit (HOSPITAL_BASED_OUTPATIENT_CLINIC_OR_DEPARTMENT_OTHER): Payer: Medicare Other

## 2013-12-22 DIAGNOSIS — C9201 Acute myeloblastic leukemia, in remission: Secondary | ICD-10-CM

## 2013-12-22 DIAGNOSIS — C9200 Acute myeloblastic leukemia, not having achieved remission: Secondary | ICD-10-CM

## 2013-12-22 LAB — COMPREHENSIVE METABOLIC PANEL (CC13)
ALT: 17 U/L (ref 0–55)
AST: 15 U/L (ref 5–34)
Albumin: 3.3 g/dL — ABNORMAL LOW (ref 3.5–5.0)
BUN: 26.9 mg/dL — ABNORMAL HIGH (ref 7.0–26.0)
CO2: 26 mEq/L (ref 22–29)
Calcium: 9.4 mg/dL (ref 8.4–10.4)
Chloride: 112 mEq/L — ABNORMAL HIGH (ref 98–109)
Creatinine: 1.5 mg/dL — ABNORMAL HIGH (ref 0.7–1.3)
Glucose: 67 mg/dl — ABNORMAL LOW (ref 70–140)
Potassium: 5 mEq/L (ref 3.5–5.1)

## 2013-12-22 LAB — CBC WITH DIFFERENTIAL/PLATELET
BASO%: 0.4 % (ref 0.0–2.0)
Basophils Absolute: 0 10*3/uL (ref 0.0–0.1)
EOS%: 5.6 % (ref 0.0–7.0)
HGB: 11.2 g/dL — ABNORMAL LOW (ref 13.0–17.1)
LYMPH%: 26.2 % (ref 14.0–49.0)
MCH: 29.7 pg (ref 27.2–33.4)
MONO#: 0.7 10*3/uL (ref 0.1–0.9)
NEUT#: 3.5 10*3/uL (ref 1.5–6.5)
Platelets: 114 10*3/uL — ABNORMAL LOW (ref 140–400)
RDW: 19 % — ABNORMAL HIGH (ref 11.0–14.6)
WBC: 6.1 10*3/uL (ref 4.0–10.3)
lymph#: 1.6 10*3/uL (ref 0.9–3.3)

## 2013-12-28 IMAGING — XA IR FLUORO GUIDE CV LINE*R*
1 series · 2 of 2 positions shown · non-contrast
Comparison: none

CLINICAL DATA: Acute myeloblastic leukemia.  The patient requires a
Port-A-Cath for chemotherapy.

[Series 300: line placements · 2 of 2 slices shown]
[im 1/2]
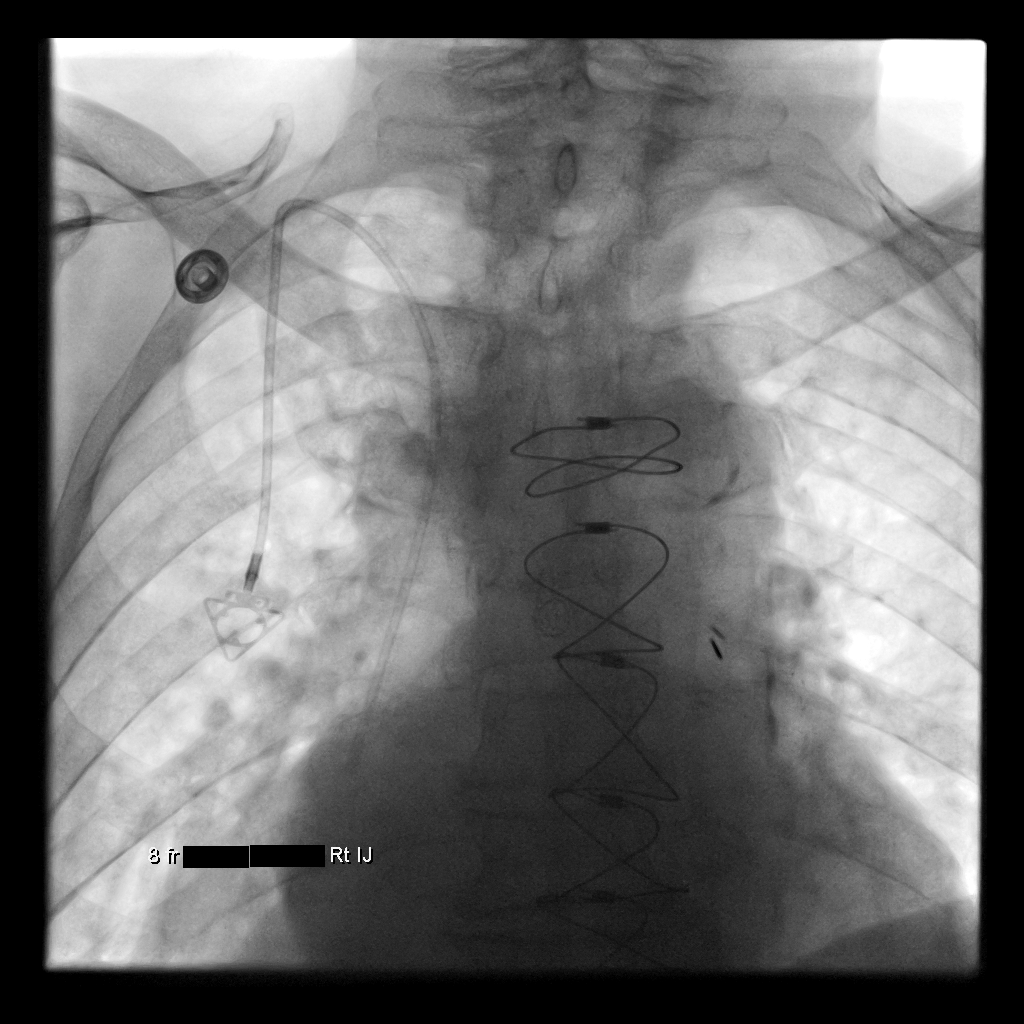
[im 2/2]
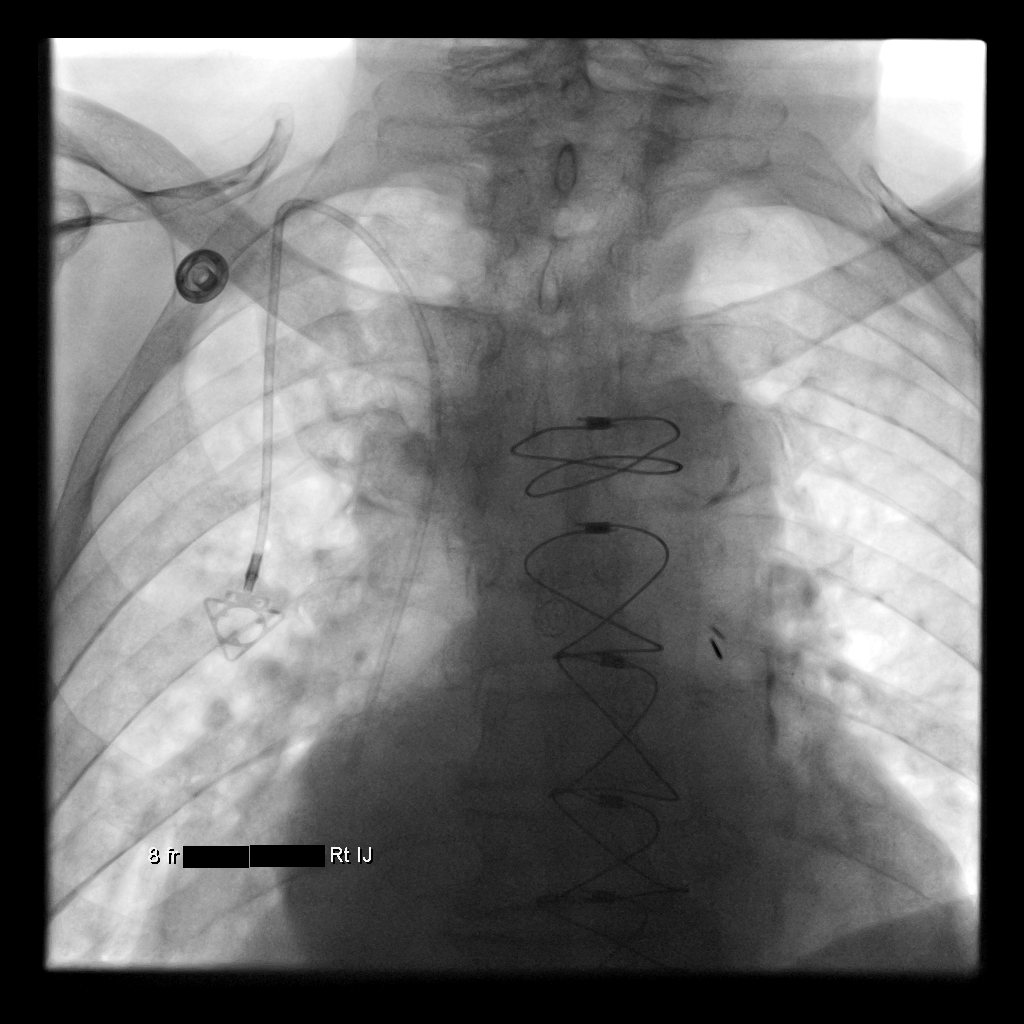

[2 of 2 positions shown; findings below may reference images not displayed]

IMPLANTED PORT A CATH PLACEMENT WITH ULTRASOUND AND FLUOROSCOPIC
GUIDANCE

Sedation:  1.0 mg IV Versed; 50 mcg IV Fentanyl.

Total Moderate Sedation Time:  40 minutes.

Additional Medications:  2 grams IV Ancef.  As antibiotic
prophylaxis, Ancef was ordered pre-procedure and administered
intravenously within one hour of incision.  One unit platelet
transfusion was also performed just prior to the procedure.

Fluoroscopy Time:  0.4 minutes.

Procedure:  The procedure, risks, benefits, and alternatives were
explained to the patient.  Questions regarding the procedure were
encouraged and answered.  The patient understands and consents to
the procedure.

The right neck and chest were prepped with chlorhexidine in a
sterile fashion, and a sterile drape was applied covering the
operative field.  Maximum barrier sterile technique with sterile
gowns and gloves were used for the procedure.  Local anesthesia was
provided with 1% lidocaine and lidocaine with epinephrine.

After creating a small venotomy incision, a 21 gauge needle was
advanced into the right internal jugular vein under direct, real-
time ultrasound guidance.  Ultrasound image documentation was
performed.  After securing guidewire access, an 8 Fr dilator was
placed.  A J-wire was kinked to measure appropriate catheter
length.

A subcutaneous port pocket was then created along the upper chest
wall utilizing sharp and blunt dissection.  Portable cautery was
utilized.  The pocket was irrigated with sterile saline.

A single lumen power injectable port was chosen for placement.  The
8 Fr catheter was tunneled from the port pocket site to the
venotomy incision.  The port was placed in the pocket and secured
with two Ethilon tacking sutures.  External catheter was trimmed to
appropriate length based on guidewire measurement.

At the venotomy, an 8 Fr peel-away sheath was placed over a
guidewire.  The catheter was then placed through the sheath and the
sheath removed.  Final catheter positioning was confirmed and
documented with a fluoroscopic spot image.  The port was accessed
with a needle and aspirated and flushed with heparinized saline.
The needle was removed.

The venotomy and port pocket incisions were closed with
subcutaneous 3-0 Monocryl and subcuticular 4-0 Vicryl.  Dermabond
was applied to both incisions.

Complications: None.  No pneumothorax.
FINDINGS: After catheter placement, the tip lies at the cavoatrial
junction.  The catheter aspirates normally and is ready for
immediate use.
IMPRESSION: Placement of single lumen port a cath via right internal jugular
vein.  The catheter tip lies at the cavoatrial junction.  A power
injectable port a cath was placed and is ready for immediate use.

Due to neutropenia with white count of the 1.3 immediately prior to
the procedure, the patient will be covered with an additional 5
days of Keflex, 500 mg twice daily.  He was given a prescription
for this antibiotic and was told to start tomorrow.

## 2013-12-29 ENCOUNTER — Encounter: Payer: Self-pay | Admitting: Hematology and Oncology

## 2013-12-29 ENCOUNTER — Telehealth: Payer: Self-pay | Admitting: *Deleted

## 2013-12-29 ENCOUNTER — Ambulatory Visit (HOSPITAL_BASED_OUTPATIENT_CLINIC_OR_DEPARTMENT_OTHER): Payer: Medicare Other | Admitting: Hematology and Oncology

## 2013-12-29 ENCOUNTER — Telehealth: Payer: Self-pay | Admitting: Hematology and Oncology

## 2013-12-29 VITALS — BP 136/63 | HR 59 | Temp 97.8°F | Resp 18 | Ht 68.0 in | Wt 187.4 lb

## 2013-12-29 DIAGNOSIS — D649 Anemia, unspecified: Secondary | ICD-10-CM

## 2013-12-29 DIAGNOSIS — N189 Chronic kidney disease, unspecified: Secondary | ICD-10-CM

## 2013-12-29 DIAGNOSIS — C9200 Acute myeloblastic leukemia, not having achieved remission: Secondary | ICD-10-CM

## 2013-12-29 DIAGNOSIS — C9211 Chronic myeloid leukemia, BCR/ABL-positive, in remission: Secondary | ICD-10-CM

## 2013-12-29 HISTORY — DX: Acute myeloblastic leukemia, not having achieved remission: C92.00

## 2013-12-29 NOTE — Telephone Encounter (Signed)
Talked to pt and gave her appt for January 2015 lab,md and chemo °

## 2013-12-29 NOTE — Telephone Encounter (Signed)
Per staff message and POF I have scheduled appts.  JMW  

## 2013-12-29 NOTE — Progress Notes (Signed)
[facility] OFFICE PROGRESS NOTE  Patient Care Team: Precious Reel, MD as PCP - General Bernestine Amass, MD (Urology) Ladene Artist, MD (Gastroenterology) Burnell Blanks, MD (Cardiology) Heath Lark, MD as Consulting Physician (Hematology and Oncology)  DIAGNOSIS: AML in remission  SUMMARY OF ONCOLOGIC HISTORY: Seen the patient, collaborated the history with him and review his records. This is a very pleasant gentleman with prior history of pancytopenia, was first seen in October 2013. Since then, he had multiple bone marrow aspirate and biopsy. His initial bone marrow biopsy in October 2002 showed 20% blast, suspicious for AML. Due to his age, he was offered Vidaza. In May of 2014, repeat bone marrow aspirate and biopsy show his blast count has reduced to about 9%. In November 2014, repeat bone marrow aspirate and biopsy confirmed complete remission  INTERVAL HISTORY: Kyle Brewer 80 y.o. male returns for further followup. He's doing well with no side effects from treatment. He denies any recent fever, chills, night sweats or abnormal weight loss The patient denies any recent signs or symptoms of bleeding such as spontaneous epistaxis, hematuria or hematochezia.  I have reviewed the past medical history, past surgical history, social history and family history with the patient and they are unchanged from previous note.  ALLERGIES:  has No Known Allergies.  MEDICATIONS:  Current Outpatient Prescriptions  Medication Sig Dispense Refill  . alendronate (FOSAMAX) 70 MG tablet Take 70 mg by mouth every Monday. Take with a full glass of water on an empty stomach.      Marland Kitchen amLODipine (NORVASC) 5 MG tablet Take 1 tablet (5 mg total) by mouth daily before breakfast.  90 tablet  3  . aspirin 81 MG tablet Take 81 mg by mouth daily.        . Calcium Carbonate-Vitamin D (CALCIUM 600 + D PO) Take 1 tablet by mouth 2 (two) times daily.      . finasteride (PROSCAR) 5 MG tablet  Take 5 mg by mouth daily.      Marland Kitchen levothyroxine (SYNTHROID, LEVOTHROID) 50 MCG tablet Take 50 mcg by mouth every evening.       Marland Kitchen lisinopril (PRINIVIL,ZESTRIL) 10 MG tablet Take 10 mg by mouth every evening.       Marland Kitchen LORazepam (ATIVAN) 0.5 MG tablet Take 0.5 mg by mouth at bedtime as needed for anxiety (sleep).       . meloxicam (MOBIC) 7.5 MG tablet Take 7.5 mg by mouth 2 (two) times daily.       . metoprolol succinate (TOPROL-XL) 100 MG 24 hr tablet Take 1 tablet (100 mg total) by mouth daily before breakfast. Take with or immediately following a meal.  90 tablet  3  . Multiple Vitamin (MULTIVITAMIN WITH MINERALS) TABS Take 1 tablet by mouth daily.      . simvastatin (ZOCOR) 20 MG tablet Take 20 mg by mouth at bedtime.        Marland Kitchen terazosin (HYTRIN) 10 MG capsule Take 10 mg by mouth at bedtime.      . vitamin B-12 (CYANOCOBALAMIN) 1000 MCG tablet Take 1,000 mcg by mouth daily.      . vitamin C (ASCORBIC ACID) 500 MG tablet Take 500 mg by mouth daily.       No current facility-administered medications for this visit.    REVIEW OF SYSTEMS:   Constitutional: Denies fevers, chills or abnormal weight loss Eyes: Denies blurriness of vision Ears, nose, mouth, throat, and face: Denies mucositis or  sore throat Respiratory: Denies cough, dyspnea or wheezes Cardiovascular: Denies palpitation, chest discomfort or lower extremity swelling Gastrointestinal:  Denies nausea, heartburn or change in bowel habits Skin: Denies abnormal skin rashes Lymphatics: Denies new lymphadenopathy or easy bruising Neurological:Denies numbness, tingling or new weaknesses Behavioral/Psych: Mood is stable, no new changes  All other systems were reviewed with the patient and are negative.  PHYSICAL EXAMINATION: ECOG PERFORMANCE STATUS: 0 - Asymptomatic  Filed Vitals:   12/29/13 0954  BP: 136/63  Pulse: 59  Temp: 97.8 F (36.6 C)  Resp: 18   Filed Weights   12/29/13 0954  Weight: 187 lb 6.4 oz (85.004 kg)     GENERAL:alert, no distress and comfortable SKIN: skin color, texture, turgor are normal, no rashes or significant lesions EYES: normal, Conjunctiva are pink and non-injected, sclera clear OROPHARYNX:no exudate, no erythema and lips, buccal mucosa, and tongue normal  NECK: supple, thyroid normal size, non-tender, without nodularity LYMPH:  no palpable lymphadenopathy in the cervical, axillary or inguinal LUNGS: clear to auscultation and percussion with normal breathing effort HEART: regular rate & rhythm and no murmurs and no lower extremity edema ABDOMEN:abdomen soft, non-tender and normal bowel sounds Musculoskeletal:no cyanosis of digits and no clubbing  NEURO: alert & oriented x 3 with fluent speech, no focal motor/sensory deficits  LABORATORY DATA:  I have reviewed the data as listed    Component Value Date/Time   NA 145 12/22/2013 1028   NA 140 11/04/2012 1201   K 5.0 12/22/2013 1028   K 4.0 11/04/2012 1201   CL 111* 06/18/2013 0854   CL 107 11/04/2012 1201   CO2 26 12/22/2013 1028   CO2 26 11/04/2012 1201   GLUCOSE 67* 12/22/2013 1028   GLUCOSE 117* 06/18/2013 0854   GLUCOSE 85 11/04/2012 1201   BUN 26.9* 12/22/2013 1028   BUN 26* 11/04/2012 1201   CREATININE 1.5* 12/22/2013 1028   CREATININE 1.56* 11/04/2012 1201   CALCIUM 9.4 12/22/2013 1028   CALCIUM 9.9 11/04/2012 1201   PROT 6.3* 12/22/2013 1028   ALBUMIN 3.3* 12/22/2013 1028   AST 15 12/22/2013 1028   ALT 17 12/22/2013 1028   ALKPHOS 55 12/22/2013 1028   BILITOT 0.44 12/22/2013 1028   GFRNONAA 39* 11/04/2012 1201   GFRAA 46* 11/04/2012 1201    No results found for this basename: SPEP,  UPEP,   kappa and lambda light chains    Lab Results  Component Value Date   WBC 6.1 12/22/2013   NEUTROABS 3.5 12/22/2013   HGB 11.2* 12/22/2013   HCT 33.9* 12/22/2013   MCV 89.6 12/22/2013   PLT 114* 12/22/2013      Chemistry      Component Value Date/Time   NA 145 12/22/2013 1028   NA 140 11/04/2012 1201    K 5.0 12/22/2013 1028   K 4.0 11/04/2012 1201   CL 111* 06/18/2013 0854   CL 107 11/04/2012 1201   CO2 26 12/22/2013 1028   CO2 26 11/04/2012 1201   BUN 26.9* 12/22/2013 1028   BUN 26* 11/04/2012 1201   CREATININE 1.5* 12/22/2013 1028   CREATININE 1.56* 11/04/2012 1201      Component Value Date/Time   CALCIUM 9.4 12/22/2013 1028   CALCIUM 9.9 11/04/2012 1201   ALKPHOS 55 12/22/2013 1028   AST 15 12/22/2013 1028   ALT 17 12/22/2013 1028   BILITOT 0.44 12/22/2013 1028     ASSESSMENT & PLAN:  #1 AML The patient has achieved complete remission with chemotherapy. The patient is  developing slight pancytopenia with treatment. I will resume his treatment next week and reduced to treatment days to 5 days once every 28 days. He agreed with the plan. My plan would be to continue chemotherapy indefinitely. As well as he continue to response to treatment, we will continue to administer chemotherapy 5 days every 28 days. I would repeat bone marrow aspirate and biopsy again in 6 months. #2 chronic kidney disease His creatinine is stable. We'll continue observation only. #3 anemia This is likely anemia of chronic disease. The patient denies recent history of bleeding such as epistaxis, hematuria or hematochezia. He is asymptomatic from the anemia. We will observe for now.  He does not require transfusion now.  #4 thrombocytopenia This is likely due to recent treatment. The patient denies recent history of bleeding such as epistaxis, hematuria or hematochezia. He is asymptomatic from the low platelet count. I will observe for now.  he does not require transfusion now. I will continue the chemotherapy at current dose without dosage adjustment.  If the thrombocytopenia gets progressive worse in the future, I might have to delay his treatment or adjust the chemotherapy dose. The All questions were answered. The patient knows to call the clinic with any problems, questions or concerns. No barriers to  learning was detected. I sp25 [id number]} counseling the patient face to face. The total time spent in the appointment was 40 minutes and more than 50% was on counseling and review of test results     [facility], [place], MD 12/29/2013 11:13 AM

## 2014-01-05 ENCOUNTER — Other Ambulatory Visit (HOSPITAL_BASED_OUTPATIENT_CLINIC_OR_DEPARTMENT_OTHER): Payer: Medicare Other

## 2014-01-05 ENCOUNTER — Ambulatory Visit (HOSPITAL_BASED_OUTPATIENT_CLINIC_OR_DEPARTMENT_OTHER): Payer: Medicare Other

## 2014-01-05 VITALS — BP 131/70 | HR 59 | Temp 97.8°F

## 2014-01-05 DIAGNOSIS — C9211 Chronic myeloid leukemia, BCR/ABL-positive, in remission: Secondary | ICD-10-CM

## 2014-01-05 DIAGNOSIS — D46Z Other myelodysplastic syndromes: Secondary | ICD-10-CM

## 2014-01-05 DIAGNOSIS — C9200 Acute myeloblastic leukemia, not having achieved remission: Secondary | ICD-10-CM

## 2014-01-05 DIAGNOSIS — Z5111 Encounter for antineoplastic chemotherapy: Secondary | ICD-10-CM

## 2014-01-05 DIAGNOSIS — C9201 Acute myeloblastic leukemia, in remission: Secondary | ICD-10-CM

## 2014-01-05 LAB — COMPREHENSIVE METABOLIC PANEL (CC13)
ALT: 16 U/L (ref 0–55)
AST: 18 U/L (ref 5–34)
Albumin: 3.3 g/dL — ABNORMAL LOW (ref 3.5–5.0)
Alkaline Phosphatase: 70 U/L (ref 40–150)
Anion Gap: 10 mEq/L (ref 3–11)
BUN: 35.3 mg/dL — ABNORMAL HIGH (ref 7.0–26.0)
CALCIUM: 9.1 mg/dL (ref 8.4–10.4)
CHLORIDE: 111 meq/L — AB (ref 98–109)
CO2: 24 meq/L (ref 22–29)
Creatinine: 1.7 mg/dL — ABNORMAL HIGH (ref 0.7–1.3)
GLUCOSE: 83 mg/dL (ref 70–140)
Potassium: 4.4 mEq/L (ref 3.5–5.1)
Sodium: 145 mEq/L (ref 136–145)
Total Bilirubin: 0.31 mg/dL (ref 0.20–1.20)
Total Protein: 5.9 g/dL — ABNORMAL LOW (ref 6.4–8.3)

## 2014-01-05 LAB — CBC WITH DIFFERENTIAL/PLATELET
BASO%: 0.9 % (ref 0.0–2.0)
BASOS ABS: 0 10*3/uL (ref 0.0–0.1)
EOS ABS: 0.1 10*3/uL (ref 0.0–0.5)
EOS%: 2.5 % (ref 0.0–7.0)
HEMATOCRIT: 32 % — AB (ref 38.4–49.9)
HEMOGLOBIN: 10.4 g/dL — AB (ref 13.0–17.1)
LYMPH#: 1.5 10*3/uL (ref 0.9–3.3)
LYMPH%: 31.7 % (ref 14.0–49.0)
MCH: 28.8 pg (ref 27.2–33.4)
MCHC: 32.4 g/dL (ref 32.0–36.0)
MCV: 88.8 fL (ref 79.3–98.0)
MONO#: 0.4 10*3/uL (ref 0.1–0.9)
MONO%: 7.6 % (ref 0.0–14.0)
NEUT%: 57.3 % (ref 39.0–75.0)
NEUTROS ABS: 2.8 10*3/uL (ref 1.5–6.5)
PLATELETS: 125 10*3/uL — AB (ref 140–400)
RBC: 3.6 10*6/uL — ABNORMAL LOW (ref 4.20–5.82)
RDW: 18.1 % — ABNORMAL HIGH (ref 11.0–14.6)
WBC: 4.8 10*3/uL (ref 4.0–10.3)

## 2014-01-05 MED ORDER — AZACITIDINE CHEMO SQ INJECTION
79.0000 mg/m2 | Freq: Once | INTRAMUSCULAR | Status: AC
  Administered 2014-01-05: 150 mg via SUBCUTANEOUS
  Filled 2014-01-05: qty 6

## 2014-01-05 MED ORDER — ONDANSETRON HCL 8 MG PO TABS
8.0000 mg | ORAL_TABLET | Freq: Once | ORAL | Status: AC
  Administered 2014-01-05: 8 mg via ORAL

## 2014-01-05 MED ORDER — ONDANSETRON HCL 8 MG PO TABS
ORAL_TABLET | ORAL | Status: AC
  Filled 2014-01-05: qty 1

## 2014-01-05 NOTE — Patient Instructions (Signed)
[facility] Discharge Instructions for Patients Receiving Chemotherapy  Today you received the following chemotherapy agents Vidaza. To help prevent nausea and vomiting after your treatment, we encourage you to take your nausea medication as prescribed.    If you develop nausea and vomiting that is not controlled by your nausea medication, call the clinic.   BELOW ARE SYMPTOMS THAT SHOULD BE REPORTED IMMEDIATELY:  *FEVER GREATER THAN 100.5 F  *CHILLS WITH OR WITHOUT FEVER  NAUSEA AND VOMITING THAT IS NOT CONTROLLED WITH YOUR NAUSEA MEDICATION  *UNUSUAL SHORTNESS OF BREATH  *UNUSUAL BRUISING OR BLEEDING  TENDERNESS IN MOUTH AND THROAT WITH OR WITHOUT PRESENCE OF ULCERS  *URINARY PROBLEMS  *BOWEL PROBLEMS  UNUSUAL RASH Items with * indicate a potential emergency and should be followed up as soon as possible.  Feel free to call the clinic you have any questions or concerns. The clinic phone number is [phone].    

## 2014-01-06 ENCOUNTER — Ambulatory Visit (HOSPITAL_BASED_OUTPATIENT_CLINIC_OR_DEPARTMENT_OTHER): Payer: Medicare Other

## 2014-01-06 VITALS — BP 119/59 | HR 56 | Temp 97.5°F | Resp 18

## 2014-01-06 DIAGNOSIS — Z5111 Encounter for antineoplastic chemotherapy: Secondary | ICD-10-CM

## 2014-01-06 DIAGNOSIS — D46Z Other myelodysplastic syndromes: Secondary | ICD-10-CM

## 2014-01-06 DIAGNOSIS — C9201 Acute myeloblastic leukemia, in remission: Secondary | ICD-10-CM

## 2014-01-06 DIAGNOSIS — C9200 Acute myeloblastic leukemia, not having achieved remission: Secondary | ICD-10-CM

## 2014-01-06 MED ORDER — ONDANSETRON HCL 8 MG PO TABS
ORAL_TABLET | ORAL | Status: AC
  Filled 2014-01-06: qty 1

## 2014-01-06 MED ORDER — ONDANSETRON HCL 8 MG PO TABS
8.0000 mg | ORAL_TABLET | Freq: Once | ORAL | Status: AC
  Administered 2014-01-06: 8 mg via ORAL

## 2014-01-06 MED ORDER — AZACITIDINE CHEMO SQ INJECTION
79.0000 mg/m2 | Freq: Once | INTRAMUSCULAR | Status: AC
  Administered 2014-01-06: 150 mg via SUBCUTANEOUS
  Filled 2014-01-06: qty 6

## 2014-01-06 NOTE — Patient Instructions (Signed)

## 2014-01-07 ENCOUNTER — Ambulatory Visit (HOSPITAL_BASED_OUTPATIENT_CLINIC_OR_DEPARTMENT_OTHER): Payer: Medicare Other

## 2014-01-07 VITALS — BP 129/60 | HR 59 | Temp 97.6°F | Resp 16

## 2014-01-07 DIAGNOSIS — Z5111 Encounter for antineoplastic chemotherapy: Secondary | ICD-10-CM

## 2014-01-07 DIAGNOSIS — D46Z Other myelodysplastic syndromes: Secondary | ICD-10-CM

## 2014-01-07 DIAGNOSIS — C9200 Acute myeloblastic leukemia, not having achieved remission: Secondary | ICD-10-CM

## 2014-01-07 MED ORDER — ONDANSETRON HCL 8 MG PO TABS
ORAL_TABLET | ORAL | Status: AC
  Filled 2014-01-07: qty 1

## 2014-01-07 MED ORDER — AZACITIDINE CHEMO SQ INJECTION
79.0000 mg/m2 | Freq: Once | INTRAMUSCULAR | Status: AC
  Administered 2014-01-07: 150 mg via SUBCUTANEOUS
  Filled 2014-01-07: qty 6

## 2014-01-07 MED ORDER — ONDANSETRON HCL 8 MG PO TABS
8.0000 mg | ORAL_TABLET | Freq: Once | ORAL | Status: AC
  Administered 2014-01-07: 8 mg via ORAL

## 2014-01-07 NOTE — Patient Instructions (Signed)
[place] Discharge Instructions for Patients Receiving Chemotherapy  Today you received the following chemotherapy agents Vidaza.   To help prevent nausea and vomiting after your treatment, we encourage you to take your nausea medication as prescribed.    If you develop nausea and vomiting that is not controlled by your nausea medication, call the clinic.   BELOW ARE SYMPTOMS THAT SHOULD BE REPORTED IMMEDIATELY:  *FEVER GREATER THAN 100.5 F  *CHILLS WITH OR WITHOUT FEVER  NAUSEA AND VOMITING THAT IS NOT CONTROLLED WITH YOUR NAUSEA MEDICATION  *UNUSUAL SHORTNESS OF BREATH  *UNUSUAL BRUISING OR BLEEDING  TENDERNESS IN MOUTH AND THROAT WITH OR WITHOUT PRESENCE OF ULCERS  *URINARY PROBLEMS  *BOWEL PROBLEMS  UNUSUAL RASH Items with * indicate a potential emergency and should be followed up as soon as possible.  Feel free to call the clinic should you have any questions or concerns. The clinic phone number is [phone].  It was my pleasure to take care of you today!  Leeanne Rio, RN

## 2014-01-08 ENCOUNTER — Ambulatory Visit (HOSPITAL_BASED_OUTPATIENT_CLINIC_OR_DEPARTMENT_OTHER): Payer: Medicare Other

## 2014-01-08 VITALS — BP 130/72 | HR 58 | Temp 97.8°F | Resp 18

## 2014-01-08 DIAGNOSIS — C9201 Acute myeloblastic leukemia, in remission: Secondary | ICD-10-CM

## 2014-01-08 DIAGNOSIS — C9200 Acute myeloblastic leukemia, not having achieved remission: Secondary | ICD-10-CM

## 2014-01-08 DIAGNOSIS — Z5111 Encounter for antineoplastic chemotherapy: Secondary | ICD-10-CM

## 2014-01-08 DIAGNOSIS — D46Z Other myelodysplastic syndromes: Secondary | ICD-10-CM

## 2014-01-08 MED ORDER — ONDANSETRON HCL 8 MG PO TABS
ORAL_TABLET | ORAL | Status: AC
  Filled 2014-01-08: qty 1

## 2014-01-08 MED ORDER — AZACITIDINE CHEMO SQ INJECTION
79.0000 mg/m2 | Freq: Once | INTRAMUSCULAR | Status: AC
  Administered 2014-01-08: 150 mg via SUBCUTANEOUS
  Filled 2014-01-08: qty 6

## 2014-01-08 MED ORDER — ONDANSETRON HCL 8 MG PO TABS
8.0000 mg | ORAL_TABLET | Freq: Once | ORAL | Status: AC
  Administered 2014-01-08: 8 mg via ORAL

## 2014-01-08 NOTE — Patient Instructions (Signed)
[facility] Discharge Instructions for Patients Receiving Chemotherapy  Today you received the following chemotherapy agents Vidaza  To help prevent nausea and vomiting after your treatment, we encourage you to take your nausea medication as directed.   If you develop nausea and vomiting that is not controlled by your nausea medication, call the clinic.   BELOW ARE SYMPTOMS THAT SHOULD BE REPORTED IMMEDIATELY:  *FEVER GREATER THAN 100.5 F  *CHILLS WITH OR WITHOUT FEVER  NAUSEA AND VOMITING THAT IS NOT CONTROLLED WITH YOUR NAUSEA MEDICATION  *UNUSUAL SHORTNESS OF BREATH  *UNUSUAL BRUISING OR BLEEDING  TENDERNESS IN MOUTH AND THROAT WITH OR WITHOUT PRESENCE OF ULCERS  *URINARY PROBLEMS  *BOWEL PROBLEMS  UNUSUAL RASH Items with * indicate a potential emergency and should be followed up as soon as possible.  Feel free to call the clinic you have any questions or concerns. The clinic phone number is [phone].    

## 2014-01-09 ENCOUNTER — Ambulatory Visit (HOSPITAL_BASED_OUTPATIENT_CLINIC_OR_DEPARTMENT_OTHER): Payer: Medicare Other

## 2014-01-09 VITALS — BP 109/63 | HR 66 | Temp 96.6°F

## 2014-01-09 DIAGNOSIS — C9201 Acute myeloblastic leukemia, in remission: Secondary | ICD-10-CM

## 2014-01-09 DIAGNOSIS — C9200 Acute myeloblastic leukemia, not having achieved remission: Secondary | ICD-10-CM

## 2014-01-09 DIAGNOSIS — D46Z Other myelodysplastic syndromes: Secondary | ICD-10-CM

## 2014-01-09 DIAGNOSIS — Z5111 Encounter for antineoplastic chemotherapy: Secondary | ICD-10-CM

## 2014-01-09 MED ORDER — AZACITIDINE CHEMO SQ INJECTION
150.0000 mg | Freq: Once | INTRAMUSCULAR | Status: AC
  Administered 2014-01-09: 150 mg via SUBCUTANEOUS
  Filled 2014-01-09: qty 6

## 2014-01-09 MED ORDER — ONDANSETRON HCL 8 MG PO TABS
ORAL_TABLET | ORAL | Status: AC
  Filled 2014-01-09: qty 1

## 2014-01-09 MED ORDER — ONDANSETRON HCL 8 MG PO TABS
8.0000 mg | ORAL_TABLET | Freq: Once | ORAL | Status: AC
  Administered 2014-01-09: 8 mg via ORAL

## 2014-01-09 NOTE — Patient Instructions (Signed)
[facility]charge [facility] for [facility] Receiving [facility]otherapy  Today you received the following chemotherapy agents Vidaza. To help prevent nausea and vomiting after your treatment, we encourage you to take your nausea medication as prescribed.    If you develop nausea and vomiting that is not controlled by your nausea medication, call the clinic.   BELOW ARE SYMPTOMS THAT SHOULD BE REPORTED IMMEDIATELY:  *FEVER GREATER THAN 100.5 F  *CHILLS WITH OR WITHOUT FEVER  NAUSEA AND VOMITING THAT IS NOT CONTROLLED WITH YOUR NAUSEA MEDICATION  *UNUSUAL SHORTNESS OF BREATH  *UNUSUAL BRUISING OR BLEEDING  TENDERNESS IN MOUTH AND THROAT WITH OR WITHOUT PRESENCE OF ULCERS  *URINARY PROBLEMS  *BOWEL PROBLEMS  UNUSUAL RASH Items with * indicate a potential emergency and should be followed up as soon as possible.  Feel free to call the clinic you have any questions or concerns. The clinic phone number is [phone].    

## 2014-02-02 ENCOUNTER — Telehealth: Payer: Self-pay | Admitting: Hematology and Oncology

## 2014-02-02 ENCOUNTER — Encounter: Payer: Self-pay | Admitting: Hematology and Oncology

## 2014-02-02 ENCOUNTER — Ambulatory Visit: Payer: Medicare Other

## 2014-02-02 ENCOUNTER — Ambulatory Visit (HOSPITAL_BASED_OUTPATIENT_CLINIC_OR_DEPARTMENT_OTHER): Payer: Medicare Other | Admitting: Hematology and Oncology

## 2014-02-02 ENCOUNTER — Other Ambulatory Visit (HOSPITAL_BASED_OUTPATIENT_CLINIC_OR_DEPARTMENT_OTHER): Payer: Medicare Other

## 2014-02-02 VITALS — BP 140/64 | HR 67 | Temp 97.5°F | Resp 18 | Ht 68.0 in | Wt 188.8 lb

## 2014-02-02 DIAGNOSIS — C9200 Acute myeloblastic leukemia, not having achieved remission: Secondary | ICD-10-CM

## 2014-02-02 DIAGNOSIS — C9201 Acute myeloblastic leukemia, in remission: Secondary | ICD-10-CM

## 2014-02-02 DIAGNOSIS — L309 Dermatitis, unspecified: Secondary | ICD-10-CM

## 2014-02-02 DIAGNOSIS — N189 Chronic kidney disease, unspecified: Secondary | ICD-10-CM

## 2014-02-02 DIAGNOSIS — L259 Unspecified contact dermatitis, unspecified cause: Secondary | ICD-10-CM

## 2014-02-02 DIAGNOSIS — D61818 Other pancytopenia: Secondary | ICD-10-CM

## 2014-02-02 HISTORY — DX: Dermatitis, unspecified: L30.9

## 2014-02-02 LAB — CBC WITH DIFFERENTIAL/PLATELET
BASO%: 0 % (ref 0.0–2.0)
Basophils Absolute: 0 10*3/uL (ref 0.0–0.1)
EOS%: 3.2 % (ref 0.0–7.0)
Eosinophils Absolute: 0.1 10*3/uL (ref 0.0–0.5)
HEMATOCRIT: 32.6 % — AB (ref 38.4–49.9)
HGB: 10.7 g/dL — ABNORMAL LOW (ref 13.0–17.1)
LYMPH#: 1.2 10*3/uL (ref 0.9–3.3)
LYMPH%: 61.1 % — AB (ref 14.0–49.0)
MCH: 29.6 pg (ref 27.2–33.4)
MCHC: 32.8 g/dL (ref 32.0–36.0)
MCV: 90.1 fL (ref 79.3–98.0)
MONO#: 0.1 10*3/uL (ref 0.1–0.9)
MONO%: 4.2 % (ref 0.0–14.0)
NEUT#: 0.6 10*3/uL — ABNORMAL LOW (ref 1.5–6.5)
NEUT%: 31.5 % — AB (ref 39.0–75.0)
PLATELETS: 35 10*3/uL — AB (ref 140–400)
RBC: 3.62 10*6/uL — ABNORMAL LOW (ref 4.20–5.82)
RDW: 20 % — ABNORMAL HIGH (ref 11.0–14.6)
WBC: 1.9 10*3/uL — AB (ref 4.0–10.3)

## 2014-02-02 LAB — COMPREHENSIVE METABOLIC PANEL (CC13)
ALT: 18 U/L (ref 0–55)
AST: 17 U/L (ref 5–34)
Albumin: 3.5 g/dL (ref 3.5–5.0)
Alkaline Phosphatase: 52 U/L (ref 40–150)
Anion Gap: 7 mEq/L (ref 3–11)
BILIRUBIN TOTAL: 0.47 mg/dL (ref 0.20–1.20)
BUN: 30.9 mg/dL — ABNORMAL HIGH (ref 7.0–26.0)
CHLORIDE: 111 meq/L — AB (ref 98–109)
CO2: 26 meq/L (ref 22–29)
Calcium: 9.3 mg/dL (ref 8.4–10.4)
Creatinine: 1.7 mg/dL — ABNORMAL HIGH (ref 0.7–1.3)
GLUCOSE: 79 mg/dL (ref 70–140)
Potassium: 4.3 mEq/L (ref 3.5–5.1)
SODIUM: 144 meq/L (ref 136–145)
Total Protein: 5.9 g/dL — ABNORMAL LOW (ref 6.4–8.3)

## 2014-02-02 NOTE — Progress Notes (Signed)
[place] OFFICE PROGRESS NOTE  Patient Care Team: Precious Reel, MD as PCP - General Bernestine Amass, MD (Urology) Ladene Artist, MD (Gastroenterology) Burnell Blanks, MD (Cardiology) Heath Lark, MD as Consulting Physician (Hematology and Oncology)  DIAGNOSIS: Acute myelogenous leukemia in remission  SUMMARY OF ONCOLOGIC HISTORY: This is a very pleasant gentleman with prior history of pancytopenia, was first seen in October 2013. Since then, he had multiple bone marrow aspirate and biopsy. His initial bone marrow biopsy in October 2012 showed 20% blast, suspicious for AML. Due to his age, he was offered Vidaza. In May of 2014, repeat bone marrow aspirate and biopsy show his blast count has reduced to about 9%. In November 2014, repeat bone marrow aspirate and biopsy confirmed complete remission. Between November 2014 to February 2015, he had multiple dosage adjustment to his treatment due to pancytopenia  INTERVAL HISTORY: Kyle Brewer 80 y.o. male returns for further followup. He complained of skin itching on his back at previous sites of shingles upper. He also had significant itching around his ankle. He has been putting hydrocortisone cream on those areas. He denies any recent fever, chills, night sweats or abnormal weight loss The patient denies any recent signs or symptoms of bleeding such as spontaneous epistaxis, hematuria or hematochezia.  I have reviewed the past medical history, past surgical history, social history and family history with the patient and they are unchanged from previous note.  ALLERGIES:  has No Known Allergies.  MEDICATIONS:  Current Outpatient Prescriptions  Medication Sig Dispense Refill  . alendronate (FOSAMAX) 70 MG tablet Take 70 mg by mouth every Monday. Take with a full glass of water on an empty stomach.      Marland Kitchen amLODipine (NORVASC) 5 MG tablet Take 1 tablet (5 mg total) by mouth daily before breakfast.  90 tablet  3  .  aspirin 81 MG tablet Take 81 mg by mouth daily.        . Calcium Carbonate-Vitamin D (CALCIUM 600 + D PO) Take 1 tablet by mouth 2 (two) times daily.      . finasteride (PROSCAR) 5 MG tablet Take 5 mg by mouth daily.      Marland Kitchen levothyroxine (SYNTHROID, LEVOTHROID) 50 MCG tablet Take 50 mcg by mouth every evening.       Marland Kitchen lisinopril (PRINIVIL,ZESTRIL) 10 MG tablet Take 10 mg by mouth every evening.       Marland Kitchen LORazepam (ATIVAN) 0.5 MG tablet Take 0.5 mg by mouth at bedtime as needed for anxiety (sleep).       . meloxicam (MOBIC) 7.5 MG tablet Take 7.5 mg by mouth 2 (two) times daily.       . metoprolol succinate (TOPROL-XL) 100 MG 24 hr tablet Take 1 tablet (100 mg total) by mouth daily before breakfast. Take with or immediately following a meal.  90 tablet  3  . Multiple Vitamin (MULTIVITAMIN WITH MINERALS) TABS Take 1 tablet by mouth daily.      . simvastatin (ZOCOR) 20 MG tablet Take 20 mg by mouth at bedtime.        Marland Kitchen terazosin (HYTRIN) 10 MG capsule Take 10 mg by mouth at bedtime.      . vitamin B-12 (CYANOCOBALAMIN) 1000 MCG tablet Take 1,000 mcg by mouth daily.      . vitamin C (ASCORBIC ACID) 500 MG tablet Take 500 mg by mouth daily.       No current facility-administered medications for this visit.  REVIEW OF SYSTEMS:   Constitutional: Denies fevers, chills or abnormal weight loss Eyes: Denies blurriness of vision Ears, nose, mouth, throat, and face: Denies mucositis or sore throat Respiratory: Denies cough, dyspnea or wheezes Cardiovascular: Denies palpitation, chest discomfort or lower extremity swelling Gastrointestinal:  Denies nausea, heartburn or change in bowel habits  Lymphatics: Denies new lymphadenopathy or easy bruising Neurological:Denies numbness, tingling or new weaknesses Behavioral/Psych: Mood is stable, no new changes  All other systems were reviewed with the patient and are negative.  PHYSICAL EXAMINATION: ECOG PERFORMANCE STATUS: 0 - Asymptomatic  Filed Vitals:    02/02/14 0903  BP: 140/64  Pulse: 67  Temp: 97.5 F (36.4 C)  Resp: 18   Filed Weights   02/02/14 0903  Weight: 188 lb 12.8 oz (85.639 kg)    GENERAL:alert, no distress and comfortable SKIN: Noted healing face of shingles on his back. Around his ankle they are evidence of contact dermatitis  EYES: normal, Conjunctiva are pink and non-injected, sclera clear OROPHARYNX:no exudate, no erythema and lips, buccal mucosa, and tongue normal  NECK: supple, thyroid normal size, non-tender, without nodularity LYMPH:  no palpable lymphadenopathy in the cervical, axillary or inguinal LUNGS: clear to auscultation and percussion with normal breathing effort HEART: regular rate & rhythm and no murmurs and no lower extremity edema ABDOMEN:abdomen soft, non-tender and normal bowel sounds Musculoskeletal:no cyanosis of digits and no clubbing  NEURO: alert & oriented x 3 with fluent speech, no focal motor/sensory deficits  LABORATORY DATA:  I have reviewed the data as listed    Component Value Date/Time   NA 145 01/05/2014 1359   NA 140 11/04/2012 1201   K 4.4 01/05/2014 1359   K 4.0 11/04/2012 1201   CL 111* 06/18/2013 0854   CL 107 11/04/2012 1201   CO2 24 01/05/2014 1359   CO2 26 11/04/2012 1201   GLUCOSE 83 01/05/2014 1359   GLUCOSE 117* 06/18/2013 0854   GLUCOSE 85 11/04/2012 1201   BUN 35.3* 01/05/2014 1359   BUN 26* 11/04/2012 1201   CREATININE 1.7* 01/05/2014 1359   CREATININE 1.56* 11/04/2012 1201   CALCIUM 9.1 01/05/2014 1359   CALCIUM 9.9 11/04/2012 1201   PROT 5.9* 01/05/2014 1359   ALBUMIN 3.3* 01/05/2014 1359   AST 18 01/05/2014 1359   ALT 16 01/05/2014 1359   ALKPHOS 70 01/05/2014 1359   BILITOT 0.31 01/05/2014 1359   GFRNONAA 39* 11/04/2012 1201   GFRAA 46* 11/04/2012 1201    No results found for this basename: SPEP,  UPEP,   kappa and lambda light chains    Lab Results  Component Value Date   WBC 1.9* 02/02/2014   NEUTROABS 0.6* 02/02/2014   HGB 10.7* 02/02/2014   HCT 32.6*  02/02/2014   MCV 90.1 02/02/2014   PLT 35* 02/02/2014      Chemistry      Component Value Date/Time   NA 145 01/05/2014 1359   NA 140 11/04/2012 1201   K 4.4 01/05/2014 1359   K 4.0 11/04/2012 1201   CL 111* 06/18/2013 0854   CL 107 11/04/2012 1201   CO2 24 01/05/2014 1359   CO2 26 11/04/2012 1201   BUN 35.3* 01/05/2014 1359   BUN 26* 11/04/2012 1201   CREATININE 1.7* 01/05/2014 1359   CREATININE 1.56* 11/04/2012 1201      Component Value Date/Time   CALCIUM 9.1 01/05/2014 1359   CALCIUM 9.9 11/04/2012 1201   ALKPHOS 70 01/05/2014 1359   AST 18 01/05/2014 1359   ALT  16 01/05/2014 1359   BILITOT 0.31 01/05/2014 1359     ASSESSMENT & PLAN:  #1 AML  His last bone marrow aspirate and biopsy show complete remission. I plan to repeat bone marrow biopsy again around May of 2015. In the meantime we will continue on chemotherapy #2 severe pancytopenia The patient is not symptomatic. He does not require transfusion. I plan to delay his chemotherapy by one week and to lengthen his future chemotherapy cycle to 5 weeks. In addition, I will also reduce his chemotherapy by 20% due to his age and severe pancytopenia #3 chronic kidney disease This is stable. We will observe only #4 severe skin itching The itching on his back is related to the healing process of recent shingles outbreak. Around his ankle, there is evidence of dry skin and dermatitis. I recommend he continue on hydrocortisone cream. All questions were answered. The patient knows to call the clinic with any problems, questions or concerns. No barriers to learning was detected.    [place], [place], MD 02/02/2014 9:25 AM

## 2014-02-02 NOTE — Telephone Encounter (Signed)
gv pt appt schedule for feb/march. per 2/5 pof delay this weeks tx to next wk and next f/u chemo cycle 3/23 - confirmed w.NG.

## 2014-02-03 ENCOUNTER — Ambulatory Visit: Payer: Medicare Other

## 2014-02-04 ENCOUNTER — Ambulatory Visit: Payer: Medicare Other

## 2014-02-05 ENCOUNTER — Ambulatory Visit: Payer: Medicare Other

## 2014-02-06 ENCOUNTER — Ambulatory Visit: Payer: Medicare Other

## 2014-02-09 ENCOUNTER — Ambulatory Visit: Payer: Medicare Other

## 2014-02-09 ENCOUNTER — Other Ambulatory Visit (HOSPITAL_BASED_OUTPATIENT_CLINIC_OR_DEPARTMENT_OTHER): Payer: Medicare Other

## 2014-02-09 DIAGNOSIS — C9201 Acute myeloblastic leukemia, in remission: Secondary | ICD-10-CM

## 2014-02-09 DIAGNOSIS — C9200 Acute myeloblastic leukemia, not having achieved remission: Secondary | ICD-10-CM

## 2014-02-09 LAB — COMPREHENSIVE METABOLIC PANEL (CC13)
ALBUMIN: 3.7 g/dL (ref 3.5–5.0)
ALK PHOS: 55 U/L (ref 40–150)
ALT: 22 U/L (ref 0–55)
AST: 19 U/L (ref 5–34)
Anion Gap: 6 mEq/L (ref 3–11)
BUN: 30.7 mg/dL — ABNORMAL HIGH (ref 7.0–26.0)
CALCIUM: 9.6 mg/dL (ref 8.4–10.4)
CHLORIDE: 112 meq/L — AB (ref 98–109)
CO2: 26 mEq/L (ref 22–29)
Creatinine: 1.6 mg/dL — ABNORMAL HIGH (ref 0.7–1.3)
Glucose: 73 mg/dl (ref 70–140)
POTASSIUM: 4.8 meq/L (ref 3.5–5.1)
Sodium: 144 mEq/L (ref 136–145)
Total Bilirubin: 0.45 mg/dL (ref 0.20–1.20)
Total Protein: 6.1 g/dL — ABNORMAL LOW (ref 6.4–8.3)

## 2014-02-09 LAB — CBC WITH DIFFERENTIAL/PLATELET
BASO%: 0.4 % (ref 0.0–2.0)
Basophils Absolute: 0 10*3/uL (ref 0.0–0.1)
EOS%: 1.3 % (ref 0.0–7.0)
Eosinophils Absolute: 0 10*3/uL (ref 0.0–0.5)
HEMATOCRIT: 32.7 % — AB (ref 38.4–49.9)
HGB: 10.9 g/dL — ABNORMAL LOW (ref 13.0–17.1)
LYMPH%: 68.1 % — ABNORMAL HIGH (ref 14.0–49.0)
MCH: 30 pg (ref 27.2–33.4)
MCHC: 33.3 g/dL (ref 32.0–36.0)
MCV: 90.1 fL (ref 79.3–98.0)
MONO#: 0.1 10*3/uL (ref 0.1–0.9)
MONO%: 3 % (ref 0.0–14.0)
NEUT#: 0.6 10*3/uL — ABNORMAL LOW (ref 1.5–6.5)
NEUT%: 27.2 % — AB (ref 39.0–75.0)
Platelets: 35 10*3/uL — ABNORMAL LOW (ref 140–400)
RBC: 3.63 10*6/uL — AB (ref 4.20–5.82)
RDW: 20.4 % — ABNORMAL HIGH (ref 11.0–14.6)
WBC: 2.3 10*3/uL — AB (ref 4.0–10.3)
lymph#: 1.6 10*3/uL (ref 0.9–3.3)
nRBC: 0 % (ref 0–0)

## 2014-02-09 LAB — TECHNOLOGIST REVIEW

## 2014-02-09 MED ORDER — ONDANSETRON HCL 8 MG PO TABS
ORAL_TABLET | ORAL | Status: AC
  Filled 2014-02-09: qty 1

## 2014-02-09 NOTE — Progress Notes (Signed)
Chemo held today per Dr. Alvy Bimler. ANC 0.6 and platelet count 35. Patient met in lobby and given copy of labs, AVS, information on neutropenia and thrombocytopenia. Also given several masks and educated about the importance of wearing them in public and frequent handwashing. Will return next week as scheduled. Cindi Carbon, RN

## 2014-02-09 NOTE — Patient Instructions (Signed)
Thrombocytopenia Thrombocytopenia means there are not enough platelets in your blood. Platelets are tiny cells in your blood. When you start bleeding, platelets clump together around the cut or injury to stop the bleeding. This process is called blood clotting. Not having enough platelets can cause bleeding problems. HOME CARE  Check your skin and inside your mouth for bruises or blood as told by your doctor.  Check your spit (sputum), pee (urine), and poop (stool) for blood as told by your doctor.  Do not do activities that can cause bumps or bruises until your doctor says it is okay.  Be careful not to cut yourself when you shave or use scissors, needles, knives, or other tools.  Be careful not to burn yourself when you iron or cook.  Ask your doctor if you can drink alcohol.  Only take medicines as told by your doctor.  Tell all your doctors and your dentist that you have this bleeding problem. GET HELP RIGHT AWAY IF:  You are bleeding anywhere on your body.  You are bleeding or have bruises without knowing why.  You have blood in your spit, pee, or poop. MAKE SURE YOU:  Understand these instructions.  Will watch your condition.  Will get help right away if you are not doing well or get worse. Document Released: 11/30/2011 Document Revised: 03/04/2012 Document Reviewed: 11/30/2011 [facility] Patient Information 2014 [place], [place]. Neutropenia Neutropenia is a condition that occurs when the level of a certain type of white blood cell (neutrophil) in your body becomes lower than normal. Neutrophils are made in the bone marrow and fight infections. These cells protect against bacteria and viruses. The fewer neutrophils you have, and the longer your body remains without them, the greater your risk of getting a severe infection becomes. CAUSES  The cause of neutropenia may be hard to determine. However, it is usually due to 3 main problems:   Decreased production of  neutrophils. This may be due to:  Certain medicines such as chemotherapy.  Genetic problems.  Cancer.  Radiation treatments.  Vitamin deficiency.  Some pesticides.  Increased destruction of neutrophils. This may be due to:  Overwhelming infections.  Hemolytic anemia. This is when the body destroys its own blood cells.  Chemotherapy.  Neutrophils moving to areas of the body where they cannot fight infections. This may be due to:  Dialysis procedures.  Conditions where the spleen becomes enlarged. Neutrophils are held in the spleen and are not available to the rest of the body.  Overwhelming infections. The neutrophils are held in the area of the infection and are not available to the rest of the body. SYMPTOMS  There are no specific symptoms of neutropenia. The lack of neutrophils can result in an infection, and an infection can cause various problems. DIAGNOSIS  Diagnosis is made by a blood test. A complete blood count is performed. The normal level of neutrophils in human blood differs with age and race. Infants have lower counts than older children and adults. African Americans have lower counts than Caucasians or Asians. The average adult level is 1500 cells/mm3 of blood. Neutrophil counts are interpreted as follows:  Greater than 1000 cells/mm3 gives normal protection against infection.  500 to 1000 cells/mm3 gives an increased risk for infection.  200 to 500 cells/mm3 is a greater risk for severe infection.  Lower than 200 cells/mm3 is a marked risk of infection. This may require hospitalization and treatment with antibiotic medicines. TREATMENT  Treatment depends on the underlying cause, severity,  and presence of infections or symptoms. It also depends on your health. Your caregiver will discuss the treatment plan with you. Mild cases are often easily treated and have a good outcome. Preventative measures may also be started to limit your risk of infections. Treatment  can include:  Taking antibiotics.  Stopping medicines that are known to cause neutropenia.  Correcting nutritional deficiencies by eating green vegetables to supply folic acid and taking vitamin B supplements.  Stopping exposure to pesticides if your neutropenia is related to pesticide exposure.  Taking a blood growth factor called sargramostim, pegfilgrastim, or filgrastim if you are undergoing chemotherapy for cancer. This stimulates white blood cell production.  Removal of the spleen if you have Felty's syndrome and have repeated infections. HOME CARE INSTRUCTIONS   Follow your caregiver's instructions about when you need to have blood work done.  Wash your hands often. Make sure others who come in contact with you also wash their hands.  Wash raw fruits and vegetables before eating them. They can carry bacteria and fungi.  Avoid people with colds or spreadable (contagious) diseases (chickenpox, herpes zoster, influenza).  Avoid large crowds.  Avoid construction areas. The dust can release fungus into the air.  Be cautious around children in daycare or school environments.  Take care of your respiratory system by coughing and deep breathing.  Bathe daily.  Protect your skin from cuts and burns.  Do not work in the garden or with flowers and plants.  Care for the mouth before and after meals by brushing with a soft toothbrush. If you have mucositis, do not use mouthwash. Mouthwash contains alcohol and can dry out the mouth even more.  Clean the area between the genitals and the anus (perineal area) after urination and bowel movements. Women need to wipe from front to back.  Use a water soluble lubricant during sexual intercourse and practice good hygiene after. Do not have intercourse if you are severely neutropenic. Check with your caregiver for guidelines.  Exercise daily as tolerated.  Avoid people who were vaccinated with a live vaccine in the past 30 days. You  should not receive live vaccines (polio, typhoid).  Do not provide direct care for pets. Avoid animal droppings. Do not clean litter boxes and bird cages.  Do not share food utensils.  Do not use tampons, enemas, or rectal suppositories unless directed by your caregiver.  Use an electric razor to remove hair.  Wash your hands after handling magazines, letters, and newspapers. SEEK IMMEDIATE MEDICAL CARE IF:   You have a fever.  You have chills or start to shake.  You feel nauseous or vomit.  You develop mouth sores.  You develop aches and pains.  You have redness and swelling around open wounds.  Your skin is warm to the touch.  You have pus coming from your wounds.  You develop swollen lymph nodes.  You feel weak or fatigued.  You develop red streaks on the skin. MAKE SURE YOU:  Understand these instructions.  Will watch your condition.  Will get help right away if you are not doing well or get worse. Document Released: 06/02/2002 Document Revised: 03/04/2012 Document Reviewed: 06/30/2011 [facility] Patient Information 2014 [place], [place].

## 2014-02-10 ENCOUNTER — Ambulatory Visit: Payer: Medicare Other

## 2014-02-11 ENCOUNTER — Ambulatory Visit: Payer: Medicare Other

## 2014-02-12 ENCOUNTER — Ambulatory Visit: Payer: Medicare Other

## 2014-02-13 ENCOUNTER — Ambulatory Visit: Payer: Medicare Other

## 2014-02-16 ENCOUNTER — Telehealth: Payer: Self-pay | Admitting: *Deleted

## 2014-02-16 ENCOUNTER — Other Ambulatory Visit (HOSPITAL_BASED_OUTPATIENT_CLINIC_OR_DEPARTMENT_OTHER): Payer: Medicare Other

## 2014-02-16 ENCOUNTER — Ambulatory Visit: Payer: Medicare Other

## 2014-02-16 ENCOUNTER — Other Ambulatory Visit: Payer: Self-pay | Admitting: Hematology and Oncology

## 2014-02-16 DIAGNOSIS — C9200 Acute myeloblastic leukemia, not having achieved remission: Secondary | ICD-10-CM

## 2014-02-16 DIAGNOSIS — D61818 Other pancytopenia: Secondary | ICD-10-CM

## 2014-02-16 DIAGNOSIS — C9201 Acute myeloblastic leukemia, in remission: Secondary | ICD-10-CM

## 2014-02-16 LAB — CBC WITH DIFFERENTIAL/PLATELET
BASO%: 0 % (ref 0.0–2.0)
BASOS ABS: 0 10*3/uL (ref 0.0–0.1)
EOS ABS: 0.1 10*3/uL (ref 0.0–0.5)
EOS%: 3.4 % (ref 0.0–7.0)
HEMATOCRIT: 33.7 % — AB (ref 38.4–49.9)
HEMOGLOBIN: 11 g/dL — AB (ref 13.0–17.1)
LYMPH#: 1.5 10*3/uL (ref 0.9–3.3)
LYMPH%: 72 % — ABNORMAL HIGH (ref 14.0–49.0)
MCH: 30.1 pg (ref 27.2–33.4)
MCHC: 32.6 g/dL (ref 32.0–36.0)
MCV: 92.1 fL (ref 79.3–98.0)
MONO#: 0.1 10*3/uL (ref 0.1–0.9)
MONO%: 4.3 % (ref 0.0–14.0)
NEUT#: 0.4 10*3/uL — CL (ref 1.5–6.5)
NEUT%: 20.3 % — ABNORMAL LOW (ref 39.0–75.0)
Platelets: 25 10*3/uL — ABNORMAL LOW (ref 140–400)
RBC: 3.66 10*6/uL — ABNORMAL LOW (ref 4.20–5.82)
RDW: 20.3 % — ABNORMAL HIGH (ref 11.0–14.6)
WBC: 2.1 10*3/uL — ABNORMAL LOW (ref 4.0–10.3)
nRBC: 0 % (ref 0–0)

## 2014-02-16 LAB — COMPREHENSIVE METABOLIC PANEL (CC13)
ALT: 19 U/L (ref 0–55)
ANION GAP: 10 meq/L (ref 3–11)
AST: 18 U/L (ref 5–34)
Albumin: 3.5 g/dL (ref 3.5–5.0)
Alkaline Phosphatase: 53 U/L (ref 40–150)
BUN: 28.6 mg/dL — ABNORMAL HIGH (ref 7.0–26.0)
CALCIUM: 9.1 mg/dL (ref 8.4–10.4)
CO2: 23 meq/L (ref 22–29)
Chloride: 112 mEq/L — ABNORMAL HIGH (ref 98–109)
Creatinine: 1.6 mg/dL — ABNORMAL HIGH (ref 0.7–1.3)
GLUCOSE: 92 mg/dL (ref 70–140)
Potassium: 4.3 mEq/L (ref 3.5–5.1)
SODIUM: 145 meq/L (ref 136–145)
TOTAL PROTEIN: 6.1 g/dL — AB (ref 6.4–8.3)
Total Bilirubin: 0.51 mg/dL (ref 0.20–1.20)

## 2014-02-16 NOTE — Telephone Encounter (Signed)
Scheduled BMBx for Monday 3/02 at Short Stay.   Notified pt and he says unfortunately his son is not available on Monday to bring him.  He says he would rather have it done by Dr. Alvy Bimler this Thursday if possible.  He says he prefers sedation but will do it w/o sedation if it can be done Thursday.

## 2014-02-16 NOTE — Telephone Encounter (Signed)
The 1015 is better All I can do is to give him percocet PO at [facility]

## 2014-02-16 NOTE — Telephone Encounter (Signed)
Pt scheduled to come to [facility] for BMBx on Thursday 2/26,  Arrive at 9:15 am for lab and then BMBx.  Informed pt of new schedule to be here at 9;15 am on 2/26.  He verbalized understanding.  Notified Carter in Flow of change in schedule.

## 2014-02-16 NOTE — Progress Notes (Signed)
Chemo held today per Dr Alvy Bimler. ANC-0.4, plts-25

## 2014-02-17 ENCOUNTER — Ambulatory Visit: Payer: Medicare Other

## 2014-02-18 ENCOUNTER — Telehealth: Payer: Self-pay | Admitting: *Deleted

## 2014-02-18 ENCOUNTER — Ambulatory Visit: Payer: Medicare Other

## 2014-02-18 NOTE — Telephone Encounter (Signed)
Pt unsure whether he will be able to make his appt for BMBx tomorrow as scheduled due to possible snow.   He does not want to re schedule it yet just in case the weather tomorrow is not as bad as predicted.  Instructed pt to call us in the morning if he feels he can't safely make it.  Assured him it is ok to r/s if it is not safe to drive.  He verbalized understanding.

## 2014-02-19 ENCOUNTER — Other Ambulatory Visit: Payer: Medicare Other

## 2014-02-19 ENCOUNTER — Ambulatory Visit: Payer: Medicare Other

## 2014-02-19 ENCOUNTER — Telehealth: Payer: Self-pay | Admitting: *Deleted

## 2014-02-19 ENCOUNTER — Telehealth: Payer: Self-pay | Admitting: Hematology and Oncology

## 2014-02-19 NOTE — Telephone Encounter (Signed)
Message copied by Cathlean Cower on Thu Feb 19, 2014  8:26 AM ------      Message from: Hassan Rowan E      Created: Thu Feb 19, 2014  8:09 AM      Regarding: pt BX       Hello,            Dr. Alvy Bimler is aware [facility] called and he will not be able to make his lab and BX today. I was advised to let you know so we can get him rescheduled.  ------

## 2014-02-19 NOTE — Telephone Encounter (Signed)
pt called to cx BX due to weather....Dr. Alvy Bimler aware and I staff msg Cameo to get this r/s per MD

## 2014-02-20 ENCOUNTER — Ambulatory Visit: Payer: Medicare Other

## 2014-02-20 ENCOUNTER — Telehealth: Payer: Self-pay | Admitting: Hematology and Oncology

## 2014-02-20 NOTE — Telephone Encounter (Signed)
s.w. pt and advised on Monday BX....pt was already aware

## 2014-02-20 NOTE — Telephone Encounter (Signed)
Informed pt of appt on Monday 3/02 to arrive at 11:15 am for lab then BMBx in infusion room bed.  Informed pt ok to drive himself if he is able and may eat/drink and take his regular medications.  He verbalized understanding.

## 2014-02-21 ENCOUNTER — Encounter: Payer: Self-pay | Admitting: Oncology

## 2014-02-23 ENCOUNTER — Telehealth: Payer: Self-pay | Admitting: *Deleted

## 2014-02-23 ENCOUNTER — Ambulatory Visit: Payer: Medicare Other

## 2014-02-23 ENCOUNTER — Other Ambulatory Visit (HOSPITAL_COMMUNITY): Payer: Medicare Other

## 2014-02-23 ENCOUNTER — Other Ambulatory Visit (HOSPITAL_BASED_OUTPATIENT_CLINIC_OR_DEPARTMENT_OTHER): Payer: Medicare Other

## 2014-02-23 ENCOUNTER — Other Ambulatory Visit (HOSPITAL_COMMUNITY)
Admission: RE | Admit: 2014-02-23 | Discharge: 2014-02-23 | Disposition: A | Discharge status: Home / Self Care | Payer: Medicare Other | Source: Ambulatory Visit | Attending: Hematology and Oncology | Admitting: Hematology and Oncology

## 2014-02-23 ENCOUNTER — Other Ambulatory Visit: Payer: Self-pay | Admitting: Hematology and Oncology

## 2014-02-23 ENCOUNTER — Other Ambulatory Visit: Payer: Self-pay | Admitting: *Deleted

## 2014-02-23 DIAGNOSIS — C9201 Acute myeloblastic leukemia, in remission: Secondary | ICD-10-CM

## 2014-02-23 DIAGNOSIS — C9200 Acute myeloblastic leukemia, not having achieved remission: Secondary | ICD-10-CM

## 2014-02-23 LAB — CBC WITH DIFFERENTIAL/PLATELET
BASO%: 0 % (ref 0.0–2.0)
Basophils Absolute: 0 10*3/uL (ref 0.0–0.1)
EOS%: 4.1 % (ref 0.0–7.0)
Eosinophils Absolute: 0.1 10*3/uL (ref 0.0–0.5)
HCT: 32.4 % — ABNORMAL LOW (ref 38.4–49.9)
HGB: 10.9 g/dL — ABNORMAL LOW (ref 13.0–17.1)
LYMPH%: 70.9 % — AB (ref 14.0–49.0)
MCH: 30.7 pg (ref 27.2–33.4)
MCHC: 33.6 g/dL (ref 32.0–36.0)
MCV: 91.3 fL (ref 79.3–98.0)
MONO#: 0.1 10*3/uL (ref 0.1–0.9)
MONO%: 3.2 % (ref 0.0–14.0)
NEUT#: 0.5 10*3/uL — CL (ref 1.5–6.5)
NEUT%: 21.8 % — ABNORMAL LOW (ref 39.0–75.0)
NRBC: 0 % (ref 0–0)
Platelets: 21 10*3/uL — ABNORMAL LOW (ref 140–400)
RBC: 3.55 10*6/uL — AB (ref 4.20–5.82)
RDW: 20 % — ABNORMAL HIGH (ref 11.0–14.6)
WBC: 2.2 10*3/uL — ABNORMAL LOW (ref 4.0–10.3)
lymph#: 1.6 10*3/uL (ref 0.9–3.3)

## 2014-02-23 LAB — COMPREHENSIVE METABOLIC PANEL (CC13)
ALT: 18 U/L (ref 0–55)
ANION GAP: 5 meq/L (ref 3–11)
AST: 18 U/L (ref 5–34)
Albumin: 3.7 g/dL (ref 3.5–5.0)
Alkaline Phosphatase: 57 U/L (ref 40–150)
BUN: 28 mg/dL — AB (ref 7.0–26.0)
CO2: 24 meq/L (ref 22–29)
CREATININE: 1.5 mg/dL — AB (ref 0.7–1.3)
Calcium: 9.4 mg/dL (ref 8.4–10.4)
Chloride: 114 mEq/L — ABNORMAL HIGH (ref 98–109)
GLUCOSE: 88 mg/dL (ref 70–140)
Potassium: 4.6 mEq/L (ref 3.5–5.1)
Sodium: 143 mEq/L (ref 136–145)
Total Bilirubin: 0.41 mg/dL (ref 0.20–1.20)
Total Protein: 6.3 g/dL — ABNORMAL LOW (ref 6.4–8.3)

## 2014-02-23 LAB — BONE MARROW EXAM

## 2014-02-23 NOTE — Progress Notes (Unsigned)
Pt given written and verbal instructions prior to discharge.  Bone marrow site unremarkable at discharge.

## 2014-02-23 NOTE — Telephone Encounter (Signed)
Per chemo RN and POF I have scheduled appts 

## 2014-02-23 NOTE — Patient Instructions (Signed)
[facility] Discharge Instructions for Post Bone Marrow Procedure  Today you had a bone marrow biopsy and aspirate of the right hip   Please keep the pressure dressing in place for at least 24 hours.  Have someone check your dressing periodically for bleeding.  If needed you can reapply a pressure dressing to the site.  Take pain medication  as directed.  IF BLEEDING REOCCURS THAT SHOULD BE REPORTED IMMEDIATELY. Call the [facility] at [phone] if during business hours. Or report to the Emergency Room.   I have been informed and understand all the instructions given to me. I know to contact the clinic, my physician, or go to the Emergency Department if any problems should occur. I do not have any questions at this time, but understand that I may call the clinic during office hours at [phone]  should I have any questions or need assistance in obtaining follow up care.    __________________________________________  _____________  __________ Signature of Patient or Authorized Representative            Date                   Time    __________________________________________ Nurse's Signature

## 2014-02-24 NOTE — Procedures (Signed)
Bone Marrow Biopsy and Aspiration Procedure Note   Informed consent was obtained and potential risks including bleeding, infection and pain were reviewed with the patient. The patient's name, date of birth, identification, consent and allergies were verified prior to the start of procedure and time out was performed.  The right posterior iliac crest was chosen as the site of biopsy.  The skin was prepped with Betadine solution.   8 cc of 2% lidocaine was used to provide local anaesthesia.   10 cc of bone marrow aspirate was obtained followed by 1 inch biopsy.   The procedure was tolerated well and there were no complications.  The patient was stable at the end of the procedure.  Specimens sent for flow cytometry, cytogenetics and additional studies.  

## 2014-02-26 ENCOUNTER — Encounter: Payer: Self-pay | Admitting: Hematology and Oncology

## 2014-02-26 ENCOUNTER — Ambulatory Visit (HOSPITAL_BASED_OUTPATIENT_CLINIC_OR_DEPARTMENT_OTHER): Payer: Medicare Other | Admitting: Hematology and Oncology

## 2014-02-26 ENCOUNTER — Telehealth: Payer: Self-pay | Admitting: Hematology and Oncology

## 2014-02-26 VITALS — BP 152/75 | HR 76 | Temp 97.2°F | Resp 18 | Wt 189.3 lb

## 2014-02-26 DIAGNOSIS — C9200 Acute myeloblastic leukemia, not having achieved remission: Secondary | ICD-10-CM

## 2014-02-26 DIAGNOSIS — N189 Chronic kidney disease, unspecified: Secondary | ICD-10-CM

## 2014-02-26 DIAGNOSIS — D61818 Other pancytopenia: Secondary | ICD-10-CM

## 2014-02-26 MED ORDER — CIPROFLOXACIN HCL 250 MG PO TABS: 250.0000 mg | ORAL_TABLET | Freq: Two times a day (BID) | ORAL | Status: DC

## 2014-02-26 NOTE — Telephone Encounter (Signed)
gave pt appt for lab and Md for 03/02/14

## 2014-02-26 NOTE — Progress Notes (Signed)
[facility] OFFICE PROGRESS NOTE  Patient Care Team: Precious Reel, MD as PCP - General Bernestine Amass, MD (Urology) Ladene Artist, MD (Gastroenterology) Burnell Blanks, MD (Cardiology) Heath Lark, MD as Consulting Physician (Hematology and Oncology)  DIAGNOSIS: Acute myelogenous leukemia, failed recent chemotherapy  SUMMARY OF ONCOLOGIC HISTORY: This is a very pleasant gentleman with prior history of pancytopenia, was first seen in October 2013. Since then, he had multiple bone marrow aspirate and biopsy. His initial bone marrow biopsy in October 2012 showed 20% blast, suspicious for AML. Due to his age, he was offered Vidaza. In May of 2014, repeat bone marrow aspirate and biopsy show his blast count has reduced to about 9%. In November 2014, repeat bone marrow aspirate and biopsy confirmed complete remission. Between November 2014 to February 2015, he had multiple dosage adjustment to his treatment due to pancytopenia. On 02/23/2014, repeat bone marrow aspirate and biopsy showed progressive leukemia.  INTERVAL HISTORY: [facility] 80 y.o. male returns for further followup. He is not symptomatic apart from mild increased bruising. He denies any recent fever, chills, night sweats or abnormal weight loss  I have reviewed the past medical history, past surgical history, social history and family history with the patient and they are unchanged from previous note.  ALLERGIES:  has No Known Allergies.  MEDICATIONS:  Current Outpatient Prescriptions  Medication Sig Dispense Refill  . alendronate (FOSAMAX) 70 MG tablet Take 70 mg by mouth every Monday. Take with a full glass of water on an empty stomach.      Marland Kitchen amLODipine (NORVASC) 5 MG tablet Take 1 tablet (5 mg total) by mouth daily before breakfast.  90 tablet  3  . aspirin 81 MG tablet Take 81 mg by mouth daily.        . Calcium Carbonate-Vitamin D (CALCIUM 600 + D PO) Take 1 tablet by mouth 2 (two) times daily.       . finasteride (PROSCAR) 5 MG tablet Take 5 mg by mouth daily.      Marland Kitchen levothyroxine (SYNTHROID, LEVOTHROID) 50 MCG tablet Take 50 mcg by mouth every evening.       Marland Kitchen lisinopril (PRINIVIL,ZESTRIL) 10 MG tablet Take 10 mg by mouth every evening.       Marland Kitchen LORazepam (ATIVAN) 0.5 MG tablet Take 0.5 mg by mouth at bedtime as needed for anxiety (sleep).       . meloxicam (MOBIC) 7.5 MG tablet Take 7.5 mg by mouth 2 (two) times daily.       . metoprolol succinate (TOPROL-XL) 100 MG 24 hr tablet Take 1 tablet (100 mg total) by mouth daily before breakfast. Take with or immediately following a meal.  90 tablet  3  . Multiple Vitamin (MULTIVITAMIN WITH MINERALS) TABS Take 1 tablet by mouth daily.      . simvastatin (ZOCOR) 20 MG tablet Take 20 mg by mouth at bedtime.        Marland Kitchen terazosin (HYTRIN) 10 MG capsule Take 10 mg by mouth at bedtime.      . vitamin B-12 (CYANOCOBALAMIN) 1000 MCG tablet Take 1,000 mcg by mouth daily.      . vitamin C (ASCORBIC ACID) 500 MG tablet Take 500 mg by mouth daily.      . ciprofloxacin (CIPRO) 250 MG tablet Take 1 tablet (250 mg total) by mouth 2 (two) times daily.  14 tablet  0   No current facility-administered medications for this visit.    REVIEW  OF SYSTEMS:   Behavioral/Psych: Mood is stable, no new changes  All other systems were reviewed with the patient and are negative.  PHYSICAL EXAMINATION: ECOG PERFORMANCE STATUS: 0 - Asymptomatic  Filed Vitals:   02/26/14 1344  BP: 152/75  Pulse: 76  Temp: 97.2 F (36.2 C)  Resp: 18   Filed Weights   02/26/14 1344  Weight: 189 lb 4.8 oz (85.866 kg)    GENERAL:alert, no distress and comfortable SKIN: Noted mild bruising. No petechiae. Musculoskeletal:no cyanosis of digits and no clubbing  NEURO: alert & oriented x 3 with fluent speech, no focal motor/sensory deficits  LABORATORY DATA:  I have reviewed the data as listed    Component Value Date/Time   NA 143 02/23/2014 1143   NA 140 11/04/2012 1201   K  4.6 02/23/2014 1143   K 4.0 11/04/2012 1201   CL 111* 06/18/2013 0854   CL 107 11/04/2012 1201   CO2 24 02/23/2014 1143   CO2 26 11/04/2012 1201   GLUCOSE 88 02/23/2014 1143   GLUCOSE 117* 06/18/2013 0854   GLUCOSE 85 11/04/2012 1201   BUN 28.0* 02/23/2014 1143   BUN 26* 11/04/2012 1201   CREATININE 1.5* 02/23/2014 1143   CREATININE 1.56* 11/04/2012 1201   CALCIUM 9.4 02/23/2014 1143   CALCIUM 9.9 11/04/2012 1201   PROT 6.3* 02/23/2014 1143   ALBUMIN 3.7 02/23/2014 1143   AST 18 02/23/2014 1143   ALT 18 02/23/2014 1143   ALKPHOS 57 02/23/2014 1143   BILITOT 0.41 02/23/2014 1143   GFRNONAA 39* 11/04/2012 1201   GFRAA 46* 11/04/2012 1201    No results found for this basename: SPEP,  UPEP,   kappa and lambda light chains    Lab Results  Component Value Date   WBC 2.2* 02/23/2014   NEUTROABS 0.5* 02/23/2014   HGB 10.9* 02/23/2014   HCT 32.4* 02/23/2014   MCV 91.3 02/23/2014   PLT 21* 02/23/2014      Chemistry      Component Value Date/Time   NA 143 02/23/2014 1143   NA 140 11/04/2012 1201   K 4.6 02/23/2014 1143   K 4.0 11/04/2012 1201   CL 111* 06/18/2013 0854   CL 107 11/04/2012 1201   CO2 24 02/23/2014 1143   CO2 26 11/04/2012 1201   BUN 28.0* 02/23/2014 1143   BUN 26* 11/04/2012 1201   CREATININE 1.5* 02/23/2014 1143   CREATININE 1.56* 11/04/2012 1201      Component Value Date/Time   CALCIUM 9.4 02/23/2014 1143   CALCIUM 9.9 11/04/2012 1201   ALKPHOS 57 02/23/2014 1143   AST 18 02/23/2014 1143   ALT 18 02/23/2014 1143   BILITOT 0.41 02/23/2014 1143    ASSESSMENT & PLAN:  #1 AML #2 progressive pancytopenia with anemia, neutropenia and severe thrombocytopenia #3 chronic kidney disease I have a long discussion with the patient and his son. Unfortunately, he has progressed on recent chemotherapy. The patient was seen at [facility] last year for second opinion. I recommend the patient consideration of returning back to [facility] for systemic chemotherapy versus supportive care and  hospice only. I will see him back next week in 3 days time for further discussion and repeat blood work. I gave him prescription of ciprofloxacin to hang onto due to risk of neutropenic fever. I have also discontinue meloxicam and aspirin due to risk of bleeding.  Orders Placed This Encounter  Procedures  . Hold Tube, Blood Bank    Standing Status:  Future     Number of Occurrences:      Standing Expiration Date: 02/26/2015   All questions were answered. The patient knows to call the clinic with any problems, questions or concerns. No barriers to learning was detected. I spent 35 minutes counseling the patient face to face. The total time spent in the appointment was 45 minutes and more than 50% was on counseling and review of test results     [facility], [place], MD 02/26/2014 2:21 PM

## 2014-02-27 LAB — CHROMOSOME ANALYSIS, BONE MARROW

## 2014-02-27 LAB — TISSUE HYBRIDIZATION (BONE MARROW)-NCBH

## 2014-03-02 ENCOUNTER — Ambulatory Visit (HOSPITAL_BASED_OUTPATIENT_CLINIC_OR_DEPARTMENT_OTHER): Payer: Medicare Other | Admitting: Hematology and Oncology

## 2014-03-02 ENCOUNTER — Other Ambulatory Visit (HOSPITAL_BASED_OUTPATIENT_CLINIC_OR_DEPARTMENT_OTHER): Payer: Medicare Other

## 2014-03-02 ENCOUNTER — Telehealth: Payer: Self-pay | Admitting: Hematology and Oncology

## 2014-03-02 VITALS — BP 139/68 | HR 72 | Temp 98.1°F | Resp 18 | Ht 68.0 in | Wt 185.3 lb

## 2014-03-02 DIAGNOSIS — D61818 Other pancytopenia: Secondary | ICD-10-CM

## 2014-03-02 DIAGNOSIS — C9202 Acute myeloblastic leukemia, in relapse: Secondary | ICD-10-CM

## 2014-03-02 DIAGNOSIS — C9200 Acute myeloblastic leukemia, not having achieved remission: Secondary | ICD-10-CM

## 2014-03-02 LAB — CBC WITH DIFFERENTIAL/PLATELET
BASO%: 0.4 % (ref 0.0–2.0)
BASOS ABS: 0 10*3/uL (ref 0.0–0.1)
EOS%: 1.4 % (ref 0.0–7.0)
Eosinophils Absolute: 0 10*3/uL (ref 0.0–0.5)
HCT: 33.8 % — ABNORMAL LOW (ref 38.4–49.9)
HEMOGLOBIN: 11.1 g/dL — AB (ref 13.0–17.1)
LYMPH%: 74.4 % — ABNORMAL HIGH (ref 14.0–49.0)
MCH: 31.3 pg (ref 27.2–33.4)
MCHC: 32.8 g/dL (ref 32.0–36.0)
MCV: 95.4 fL (ref 79.3–98.0)
MONO#: 0.1 10*3/uL (ref 0.1–0.9)
MONO%: 4.6 % (ref 0.0–14.0)
NEUT#: 0.4 10*3/uL — CL (ref 1.5–6.5)
NEUT%: 19.2 % — AB (ref 39.0–75.0)
Platelets: 17 10*3/uL — ABNORMAL LOW (ref 140–400)
RBC: 3.54 10*6/uL — ABNORMAL LOW (ref 4.20–5.82)
RDW: 22 % — AB (ref 11.0–14.6)
WBC: 1.8 10*3/uL — ABNORMAL LOW (ref 4.0–10.3)
lymph#: 1.4 10*3/uL (ref 0.9–3.3)

## 2014-03-02 LAB — COMPREHENSIVE METABOLIC PANEL (CC13)
ALK PHOS: 52 U/L (ref 40–150)
ALT: 21 U/L (ref 0–55)
AST: 16 U/L (ref 5–34)
Albumin: 3.8 g/dL (ref 3.5–5.0)
Anion Gap: 8 mEq/L (ref 3–11)
BUN: 27.8 mg/dL — ABNORMAL HIGH (ref 7.0–26.0)
CO2: 26 mEq/L (ref 22–29)
CREATININE: 1.6 mg/dL — AB (ref 0.7–1.3)
Calcium: 9.8 mg/dL (ref 8.4–10.4)
Chloride: 111 mEq/L — ABNORMAL HIGH (ref 98–109)
Glucose: 82 mg/dl (ref 70–140)
Potassium: 4.3 mEq/L (ref 3.5–5.1)
Sodium: 144 mEq/L (ref 136–145)
Total Bilirubin: 0.64 mg/dL (ref 0.20–1.20)
Total Protein: 6.4 g/dL (ref 6.4–8.3)

## 2014-03-02 LAB — HOLD TUBE, BLOOD BANK

## 2014-03-02 NOTE — Progress Notes (Signed)
[place] OFFICE PROGRESS NOTE  Patient Care Team: Precious Reel, MD as PCP - General Bernestine Amass, MD (Urology) Ladene Artist, MD (Gastroenterology) Burnell Blanks, MD (Cardiology) Heath Lark, MD as Consulting Physician (Hematology and Oncology)  DIAGNOSIS: Recurrent AML  SUMMARY OF ONCOLOGIC HISTORY: This is a very pleasant gentleman with prior history of pancytopenia, was first seen in October 2013. Since then, he had multiple bone marrow aspirate and biopsy. His initial bone marrow biopsy in October 2012 showed 20% blast, suspicious for AML. Due to his age, he was offered Vidaza. In May of 2014, repeat bone marrow aspirate and biopsy show his blast count has reduced to about 9%. In November 2014, repeat bone marrow aspirate and biopsy confirmed complete remission. Between November 2014 to February 2015, he had multiple dosage adjustment to his treatment due to pancytopenia. On 02/23/2014, repeat bone marrow aspirate and biopsy showed progressive leukemia.  INTERVAL HISTORY: Kyle Brewer 80 y.o. male returns for further followup. He is undecided about how to move forward with plan of care. He is interested to obtain second opinion at [facility]. The patient denies any recent signs or symptoms of bleeding such as spontaneous epistaxis, hematuria or hematochezia. He denies any recent fever, chills, night sweats or abnormal weight loss   I have reviewed the past medical history, past surgical history, social history and family history with the patient and they are unchanged from previous note.  ALLERGIES:  has No Known Allergies.  MEDICATIONS:  Current Outpatient Prescriptions  Medication Sig Dispense Refill  . alendronate (FOSAMAX) 70 MG tablet Take 70 mg by mouth every Monday. Take with a full glass of water on an empty stomach.      Marland Kitchen amLODipine (NORVASC) 5 MG tablet Take 1 tablet (5 mg total) by mouth daily before breakfast.  90  tablet  3  . Calcium Carbonate-Vitamin D (CALCIUM 600 + D PO) Take 1 tablet by mouth 2 (two) times daily.      . ciprofloxacin (CIPRO) 250 MG tablet Take 1 tablet (250 mg total) by mouth 2 (two) times daily.  14 tablet  0  . finasteride (PROSCAR) 5 MG tablet Take 5 mg by mouth daily.      Marland Kitchen levothyroxine (SYNTHROID, LEVOTHROID) 50 MCG tablet Take 50 mcg by mouth every evening.       Marland Kitchen lisinopril (PRINIVIL,ZESTRIL) 10 MG tablet Take 10 mg by mouth every evening.       Marland Kitchen LORazepam (ATIVAN) 0.5 MG tablet Take 0.5 mg by mouth at bedtime as needed for anxiety (sleep).       . metoprolol succinate (TOPROL-XL) 100 MG 24 hr tablet Take 1 tablet (100 mg total) by mouth daily before breakfast. Take with or immediately following a meal.  90 tablet  3  . Multiple Vitamin (MULTIVITAMIN WITH MINERALS) TABS Take 1 tablet by mouth daily.      . simvastatin (ZOCOR) 20 MG tablet Take 20 mg by mouth at bedtime.        Marland Kitchen terazosin (HYTRIN) 10 MG capsule Take 10 mg by mouth at bedtime.      . vitamin B-12 (CYANOCOBALAMIN) 1000 MCG tablet Take 1,000 mcg by mouth daily.      . vitamin C (ASCORBIC ACID) 500 MG tablet Take 500 mg by mouth daily.       No current facility-administered medications for this visit.    REVIEW OF SYSTEMS:   Constitutional: Denies fevers, chills or  abnormal weight loss Eyes: Denies blurriness of vision Ears, nose, mouth, throat, and face: Denies mucositis or sore throat Respiratory: Denies cough, dyspnea or wheezes Cardiovascular: Denies palpitation, chest discomfort or lower extremity swelling Gastrointestinal:  Denies nausea, heartburn or change in bowel habits Skin: Denies abnormal skin rashes Lymphatics: Denies new lymphadenopathy or easy bruising Neurological:Denies numbness, tingling or new weaknesses Behavioral/Psych: Mood is stable, no new changes  All other systems were reviewed with the patient and are negative.  PHYSICAL EXAMINATION: ECOG PERFORMANCE STATUS: 0 -  Asymptomatic  Filed Vitals:   03/02/14 1243  BP: 139/68  Pulse: 72  Temp: 98.1 F (36.7 C)  Resp: 18   Filed Weights   03/02/14 1243  Weight: 185 lb 4.8 oz (84.052 kg)    GENERAL:alert, no distress and comfortable Musculoskeletal:no cyanosis of digits and no clubbing  NEURO: alert & oriented x 3 with fluent speech, no focal motor/sensory deficits  LABORATORY DATA:  I have reviewed the data as listed    Component Value Date/Time   NA 144 03/02/2014 1227   NA 140 11/04/2012 1201   K 4.3 03/02/2014 1227   K 4.0 11/04/2012 1201   CL 111* 06/18/2013 0854   CL 107 11/04/2012 1201   CO2 26 03/02/2014 1227   CO2 26 11/04/2012 1201   GLUCOSE 82 03/02/2014 1227   GLUCOSE 117* 06/18/2013 0854   GLUCOSE 85 11/04/2012 1201   BUN 27.8* 03/02/2014 1227   BUN 26* 11/04/2012 1201   CREATININE 1.6* 03/02/2014 1227   CREATININE 1.56* 11/04/2012 1201   CALCIUM 9.8 03/02/2014 1227   CALCIUM 9.9 11/04/2012 1201   PROT 6.4 03/02/2014 1227   ALBUMIN 3.8 03/02/2014 1227   AST 16 03/02/2014 1227   ALT 21 03/02/2014 1227   ALKPHOS 52 03/02/2014 1227   BILITOT 0.64 03/02/2014 1227   GFRNONAA 39* 11/04/2012 1201   GFRAA 46* 11/04/2012 1201    No results found for this basename: SPEP,  UPEP,   kappa and lambda light chains    Lab Results  Component Value Date   WBC 1.8* 03/02/2014   NEUTROABS 0.4* 03/02/2014   HGB 11.1* 03/02/2014   HCT 33.8* 03/02/2014   MCV 95.4 03/02/2014   PLT 17* 03/02/2014      Chemistry      Component Value Date/Time   NA 144 03/02/2014 1227   NA 140 11/04/2012 1201   K 4.3 03/02/2014 1227   K 4.0 11/04/2012 1201   CL 111* 06/18/2013 0854   CL 107 11/04/2012 1201   CO2 26 03/02/2014 1227   CO2 26 11/04/2012 1201   BUN 27.8* 03/02/2014 1227   BUN 26* 11/04/2012 1201   CREATININE 1.6* 03/02/2014 1227   CREATININE 1.56* 11/04/2012 1201      Component Value Date/Time   CALCIUM 9.8 03/02/2014 1227   CALCIUM 9.9 11/04/2012 1201   ALKPHOS 52 03/02/2014 1227   AST 16 03/02/2014 1227   ALT 21 03/02/2014  1227   BILITOT 0.64 03/02/2014 1227      ASSESSMENT & PLAN:  #1 recurrent AML The patient is interested to be referred to [facility]. I spoke with the physician there and he will arrange for followup #2 severe pancytopenia The patient does not require transfusion today. I continue to hold his antiplatelet agents. I have not make a return appointment for the patient to come back but recommended patient to call after his appointment at [facility].  Orders Placed This Encounter  Procedures  .  Ambulatory referral to Hematology / Oncology    Referral Priority:  Urgent    Referral Type:  Consultation    Referral Reason:  Specialty Services Required    Requested Specialty:  Oncology    Number of Visits Requested:  1   All questions were answered. The patient knows to call the clinic with any problems, questions or concerns. No barriers to learning was detected. I spent 15 minutes counseling the patient face to face. The total time spent in the appointment was 20 minutes and more than 50% was on counseling and review of test results     [facility], Rennert, MD 03/02/2014 1:44 PM

## 2014-03-02 NOTE — Telephone Encounter (Signed)
Pt appt with DrFlorene Glen is 03/06/14@10 :36. Medical records faxed, slides and scans will be fedex'ed . Pt is aware

## 2014-03-06 ENCOUNTER — Telehealth: Payer: Self-pay | Admitting: Hematology and Oncology

## 2014-03-06 ENCOUNTER — Other Ambulatory Visit: Payer: Self-pay | Admitting: Hematology and Oncology

## 2014-03-06 ENCOUNTER — Telehealth: Payer: Self-pay | Admitting: *Deleted

## 2014-03-06 DIAGNOSIS — C9200 Acute myeloblastic leukemia, not having achieved remission: Secondary | ICD-10-CM

## 2014-03-06 NOTE — Telephone Encounter (Signed)
s/w pt re next appt for 3/16. per 3/13 pof tx oved from wk of 3/23 to wk of 3/16. per desk nurse NG aware that we do not give this tx 7 days straight. pt to get new scheudule 3/16

## 2014-03-06 NOTE — Telephone Encounter (Signed)
Son left VM states pt saw Dr. Florene Glen at [facility] and Dr. Florene Glen wants pt to schedule labs here on Monday 3/19 to make sure he doesn't need Platelet transfusion.  Dr. Florene Glen also wants pt to resume Vidaza.  States Dr. Florene Glen said he would call Dr. Alvy Bimler today, but son wants to get appointments made.

## 2014-03-09 ENCOUNTER — Other Ambulatory Visit (HOSPITAL_BASED_OUTPATIENT_CLINIC_OR_DEPARTMENT_OTHER): Payer: Medicare Other

## 2014-03-09 ENCOUNTER — Ambulatory Visit (HOSPITAL_BASED_OUTPATIENT_CLINIC_OR_DEPARTMENT_OTHER): Payer: Medicare Other

## 2014-03-09 ENCOUNTER — Telehealth: Payer: Self-pay | Admitting: Hematology and Oncology

## 2014-03-09 ENCOUNTER — Ambulatory Visit (HOSPITAL_BASED_OUTPATIENT_CLINIC_OR_DEPARTMENT_OTHER): Payer: Medicare Other | Admitting: Hematology and Oncology

## 2014-03-09 VITALS — BP 143/64 | HR 60 | Temp 97.8°F | Resp 18 | Ht 68.0 in | Wt 186.4 lb

## 2014-03-09 DIAGNOSIS — C9200 Acute myeloblastic leukemia, not having achieved remission: Secondary | ICD-10-CM

## 2014-03-09 DIAGNOSIS — D696 Thrombocytopenia, unspecified: Secondary | ICD-10-CM

## 2014-03-09 DIAGNOSIS — D649 Anemia, unspecified: Secondary | ICD-10-CM

## 2014-03-09 DIAGNOSIS — R799 Abnormal finding of blood chemistry, unspecified: Secondary | ICD-10-CM

## 2014-03-09 DIAGNOSIS — Z5111 Encounter for antineoplastic chemotherapy: Secondary | ICD-10-CM

## 2014-03-09 DIAGNOSIS — D72819 Decreased white blood cell count, unspecified: Secondary | ICD-10-CM

## 2014-03-09 LAB — COMPREHENSIVE METABOLIC PANEL (CC13)
ALT: 17 U/L (ref 0–55)
ANION GAP: 9 meq/L (ref 3–11)
AST: 15 U/L (ref 5–34)
Albumin: 3.7 g/dL (ref 3.5–5.0)
Alkaline Phosphatase: 56 U/L (ref 40–150)
BILIRUBIN TOTAL: 0.42 mg/dL (ref 0.20–1.20)
BUN: 31.9 mg/dL — ABNORMAL HIGH (ref 7.0–26.0)
CHLORIDE: 112 meq/L — AB (ref 98–109)
CO2: 24 meq/L (ref 22–29)
Calcium: 9.3 mg/dL (ref 8.4–10.4)
Creatinine: 1.7 mg/dL — ABNORMAL HIGH (ref 0.7–1.3)
Glucose: 81 mg/dl (ref 70–140)
Potassium: 4.7 mEq/L (ref 3.5–5.1)
SODIUM: 144 meq/L (ref 136–145)
TOTAL PROTEIN: 6.2 g/dL — AB (ref 6.4–8.3)

## 2014-03-09 LAB — CBC WITH DIFFERENTIAL/PLATELET
BASO%: 0.3 % (ref 0.0–2.0)
Basophils Absolute: 0 10*3/uL (ref 0.0–0.1)
EOS%: 1.6 % (ref 0.0–7.0)
Eosinophils Absolute: 0 10*3/uL (ref 0.0–0.5)
HCT: 31.1 % — ABNORMAL LOW (ref 38.4–49.9)
HGB: 10.3 g/dL — ABNORMAL LOW (ref 13.0–17.1)
LYMPH#: 1.4 10*3/uL (ref 0.9–3.3)
LYMPH%: 80.8 % — AB (ref 14.0–49.0)
MCH: 31.7 pg (ref 27.2–33.4)
MCHC: 33 g/dL (ref 32.0–36.0)
MCV: 96.1 fL (ref 79.3–98.0)
MONO#: 0.1 10*3/uL (ref 0.1–0.9)
MONO%: 4.3 % (ref 0.0–14.0)
NEUT#: 0.2 10*3/uL — CL (ref 1.5–6.5)
NEUT%: 13 % — AB (ref 39.0–75.0)
Platelets: 14 10*3/uL — ABNORMAL LOW (ref 140–400)
RBC: 3.24 10*6/uL — AB (ref 4.20–5.82)
RDW: 22.1 % — ABNORMAL HIGH (ref 11.0–14.6)
WBC: 1.7 10*3/uL — ABNORMAL LOW (ref 4.0–10.3)

## 2014-03-09 LAB — HOLD TUBE, BLOOD BANK

## 2014-03-09 MED ORDER — ONDANSETRON HCL 8 MG PO TABS: 8.0000 mg | ORAL_TABLET | Freq: Two times a day (BID) | ORAL | Status: DC | PRN

## 2014-03-09 MED ORDER — ACYCLOVIR 400 MG PO TABS: 400.0000 mg | ORAL_TABLET | Freq: Every day | ORAL | Status: AC

## 2014-03-09 MED ORDER — ONDANSETRON HCL 8 MG PO TABS
ORAL_TABLET | ORAL | Status: AC
  Filled 2014-03-09: qty 1

## 2014-03-09 MED ORDER — AZACITIDINE CHEMO SQ INJECTION
100.0000 mg/m2 | Freq: Once | INTRAMUSCULAR | Status: AC
  Administered 2014-03-09: 200 mg via SUBCUTANEOUS
  Filled 2014-03-09: qty 8

## 2014-03-09 MED ORDER — ONDANSETRON HCL 8 MG PO TABS
8.0000 mg | ORAL_TABLET | Freq: Once | ORAL | Status: AC
  Administered 2014-03-09: 8 mg via ORAL

## 2014-03-09 NOTE — Patient Instructions (Signed)
[place] Discharge Instructions for Patients Receiving Chemotherapy  Today you received the following chemotherapy agents Vidaza  To help prevent nausea and vomiting after your treatment, we encourage you to take your nausea medication Zofran 8 mg as ordered.   If you develop nausea and vomiting that is not controlled by your nausea medication, call the clinic.   BELOW ARE SYMPTOMS THAT SHOULD BE REPORTED IMMEDIATELY:  *FEVER GREATER THAN 100.5 F  *CHILLS WITH OR WITHOUT FEVER  NAUSEA AND VOMITING THAT IS NOT CONTROLLED WITH YOUR NAUSEA MEDICATION  *UNUSUAL SHORTNESS OF BREATH  *UNUSUAL BRUISING OR BLEEDING  TENDERNESS IN MOUTH AND THROAT WITH OR WITHOUT PRESENCE OF ULCERS  *URINARY PROBLEMS  *BOWEL PROBLEMS  UNUSUAL RASH Items with * indicate a potential emergency and should be followed up as soon as possible.  Feel free to call the clinic you have any questions or concerns. The clinic phone number is [phone].

## 2014-03-09 NOTE — Telephone Encounter (Signed)
, °

## 2014-03-09 NOTE — Progress Notes (Signed)
Okay to treat despite counts is verbal order received by charge nurse and collaborative nurse per Dr. Alvy Bimler.

## 2014-03-09 NOTE — Progress Notes (Signed)
OFFICE PROGRESS NOTE  Patient Care Team: Precious Reel, MD as PCP - General Bernestine Amass, MD (Urology) Ladene Artist, MD (Gastroenterology) Burnell Blanks, MD (Cardiology) Heath Lark, MD as Consulting Physician (Hematology and Oncology)  DIAGNOSIS: Recurrent AML  SUMMARY OF ONCOLOGIC HISTORY: This is a very pleasant gentleman with prior history of pancytopenia, was first seen in October 2013. Since then, he had multiple bone marrow aspirate and biopsy. His initial bone marrow biopsy in October 2012 showed 20% blast, suspicious for AML. Due to his age, he was offered Vidaza. In May of 2014, repeat bone marrow aspirate and biopsy show his blast count has reduced to about 9%. In November 2014, repeat bone marrow aspirate and biopsy confirmed complete remission. Between November 2014 to February 2015, he had multiple dosage adjustment to his treatment due to pancytopenia. On 02/23/2014, repeat bone marrow aspirate and biopsy showed progressive leukemia. The patient went to [facility] again for a second opinion and subsequently return here for palliative treatment. On 03/09/2014, we restarted him on Vidaza at 100 mg/m2  INTERVAL HISTORY: Kyle Brewer 80 y.o. male returns for further followup. He has easy bruising. The patient denies any recent signs or symptoms of bleeding such as spontaneous epistaxis, hematuria or hematochezia. Denies any fevers, chills or cough.  I have reviewed the past medical history, past surgical history, social history and family history with the patient and they are unchanged from previous note.  ALLERGIES:  has No Known Allergies.  MEDICATIONS:  Current Outpatient Prescriptions  Medication Sig Dispense Refill  . alendronate (FOSAMAX) 70 MG tablet Take 70 mg by mouth every Monday. Take with a full glass of water on an empty stomach.      Marland Kitchen amLODipine (NORVASC) 5 MG tablet Take 1 tablet (5 mg total) by mouth daily before  breakfast.  90 tablet  3  . Calcium Carbonate-Vitamin D (CALCIUM 600 + D PO) Take 1 tablet by mouth 2 (two) times daily.      . finasteride (PROSCAR) 5 MG tablet Take 5 mg by mouth daily.      Marland Kitchen levothyroxine (SYNTHROID, LEVOTHROID) 50 MCG tablet Take 50 mcg by mouth every evening.       Marland Kitchen lisinopril (PRINIVIL,ZESTRIL) 10 MG tablet Take 10 mg by mouth every evening.       Marland Kitchen LORazepam (ATIVAN) 0.5 MG tablet Take 0.5 mg by mouth at bedtime as needed for anxiety (sleep).       . metoprolol succinate (TOPROL-XL) 100 MG 24 hr tablet Take 1 tablet (100 mg total) by mouth daily before breakfast. Take with or immediately following a meal.  90 tablet  3  . Multiple Vitamin (MULTIVITAMIN WITH MINERALS) TABS Take 1 tablet by mouth daily.      . simvastatin (ZOCOR) 20 MG tablet Take 20 mg by mouth at bedtime.        Marland Kitchen terazosin (HYTRIN) 10 MG capsule Take 10 mg by mouth at bedtime.      . vitamin B-12 (CYANOCOBALAMIN) 1000 MCG tablet Take 1,000 mcg by mouth daily.      . vitamin C (ASCORBIC ACID) 500 MG tablet Take 500 mg by mouth daily.      Marland Kitchen acyclovir (ZOVIRAX) 400 MG tablet Take 1 tablet (400 mg total) by mouth daily.  30 tablet  6  . ciprofloxacin (CIPRO) 250 MG tablet Take 1 tablet (250 mg total) by mouth 2 (two) times daily.  14 tablet  0  .  ondansetron (ZOFRAN) 8 MG tablet Take 1 tablet (8 mg total) by mouth 2 (two) times daily as needed (Nausea or vomiting).  30 tablet  1   No current facility-administered medications for this visit.    REVIEW OF SYSTEMS:   Constitutional: Denies fevers, chills or abnormal weight loss Eyes: Denies blurriness of vision Ears, nose, mouth, throat, and face: Denies mucositis or sore throat Respiratory: Denies cough, dyspnea or wheezes Cardiovascular: Denies palpitation, chest discomfort or lower extremity swelling Gastrointestinal:  Denies nausea, heartburn or change in bowel habits Skin: Denies abnormal skin rashes Lymphatics: Denies new lymphadenopathy   Neurological:Denies numbness, tingling or new weaknesses Behavioral/Psych: Mood is stable, no new changes  All other systems were reviewed with the patient and are negative.  PHYSICAL EXAMINATION: ECOG PERFORMANCE STATUS: 0 - Asymptomatic  Filed Vitals:   03/09/14 1121  BP: 143/64  Pulse: 60  Temp: 97.8 F (36.6 C)  Resp: 18   Filed Weights   03/09/14 1121  Weight: 186 lb 6.4 oz (84.55 kg)    GENERAL:alert, no distress and comfortable SKIN: skin color, texture, turgor are normal, no rashes or significant lesions. Noticed some bruising no signs of petechiae EYES: normal, Conjunctiva are pink and non-injected, sclera clear OROPHARYNX:no exudate, no erythema and lips, buccal mucosa, and tongue normal  NECK: supple, thyroid normal size, non-tender, without nodularity LYMPH:  no palpable lymphadenopathy in the cervical, axillary or inguinal LUNGS: clear to auscultation and percussion with normal breathing effort HEART: regular rate & rhythm and no murmurs and no lower extremity edema ABDOMEN:abdomen soft, non-tender and normal bowel sounds Musculoskeletal:no cyanosis of digits and no clubbing  NEURO: alert & oriented x 3 with fluent speech, no focal motor/sensory deficits  LABORATORY DATA:  I have reviewed the data as listed    Component Value Date/Time   NA 144 03/09/2014 1102   NA 140 11/04/2012 1201   K 4.7 03/09/2014 1102   K 4.0 11/04/2012 1201   CL 111* 06/18/2013 0854   CL 107 11/04/2012 1201   CO2 24 03/09/2014 1102   CO2 26 11/04/2012 1201   GLUCOSE 81 03/09/2014 1102   GLUCOSE 117* 06/18/2013 0854   GLUCOSE 85 11/04/2012 1201   BUN 31.9* 03/09/2014 1102   BUN 26* 11/04/2012 1201   CREATININE 1.7* 03/09/2014 1102   CREATININE 1.56* 11/04/2012 1201   CALCIUM 9.3 03/09/2014 1102   CALCIUM 9.9 11/04/2012 1201   PROT 6.2* 03/09/2014 1102   ALBUMIN 3.7 03/09/2014 1102   AST 15 03/09/2014 1102   ALT 17 03/09/2014 1102   ALKPHOS 56 03/09/2014 1102   BILITOT 0.42 03/09/2014  1102   GFRNONAA 39* 11/04/2012 1201   GFRAA 46* 11/04/2012 1201    No results found for this basename: SPEP,  UPEP,   kappa and lambda light chains    Lab Results  Component Value Date   WBC 1.7* 03/09/2014   NEUTROABS 0.2* 03/09/2014   HGB 10.3* 03/09/2014   HCT 31.1* 03/09/2014   MCV 96.1 03/09/2014   PLT 14* 03/09/2014      Chemistry      Component Value Date/Time   NA 144 03/09/2014 1102   NA 140 11/04/2012 1201   K 4.7 03/09/2014 1102   K 4.0 11/04/2012 1201   CL 111* 06/18/2013 0854   CL 107 11/04/2012 1201   CO2 24 03/09/2014 1102   CO2 26 11/04/2012 1201   BUN 31.9* 03/09/2014 1102   BUN 26* 11/04/2012 1201   CREATININE 1.7* 03/09/2014 1102  CREATININE 1.56* 11/04/2012 1201      Component Value Date/Time   CALCIUM 9.3 03/09/2014 1102   CALCIUM 9.9 11/04/2012 1201   ALKPHOS 56 03/09/2014 1102   AST 15 03/09/2014 1102   ALT 17 03/09/2014 1102   BILITOT 0.42 03/09/2014 1102     ASSESSMENT & PLAN:  #1 AML His prognosis is very poor. The patient has complex cytogenetics. I am very surprised that the patient wants further palliative chemotherapy. Per discussion with the physician at [facility], he recommend that we repeat treatment with Vidaza at 100 mg per meter squared. I have a long discussion with the patient about risks, benefits and side effects and ultimately he wanted to proceed with treatment.. I estimated his chance of responding to higher dose of treatment is around 20% with best case progression free survival 6-9 months. The chance of him being admitted to the hospital with where his complications including death is 50%. The patient will need intensive supportive treatment with transfusions over the next 3-6 months. He would need to come back to the clinic at least twice a week to be monitored and transfused as needed. His overall survival without treatment will be less than 3 months. The patient wants to proceed with chemotherapy. We discussed about advanced  directives and living will. The appointment his son as the medical power of attorney. The patient clarified that his status is DO NOT RESUSCITATE/DO NOT INTUBATE. #2 Anemia #3 Severe Leukopenia #4 Severe Thrombocytopenia His abnormal blood count is related to underlying disease. If the patient's hemoglobin is less than 7 g, we will give him 2 units of irradiated dlood. If his platelet count is less than 10,000 or if he has any signs of active bleeding regardless of his platelet count, he will be transfused 1 unit of irradiated platelets. I recommend him to take ciprofloxacin and acyclovir as antimicrobial prophylaxis. The patient is recommended to continue holding off taking aspirin.  Orders Placed This Encounter  Procedures  . CBC with Differential    Standing Status: Standing     Number of Occurrences: 22     Standing Expiration Date: 03/10/2015  . Comprehensive metabolic panel    Standing Status: Future     Number of Occurrences:      Standing Expiration Date: 03/09/2015  . Hold Tube, Blood Bank    Standing Status: Standing     Number of Occurrences: 22     Standing Expiration Date: 03/10/2015   All questions were answered. The patient knows to call the clinic with any problems, questions or concerns. No barriers to learning was detected. I spent 40 minutes counseling the patient face to face. The total time spent in the appointment was 55 minutes and more than 50% was on counseling and review of test results     [facility], Tat Momoli, MD 03/09/2014 12:26 PM

## 2014-03-09 NOTE — Progress Notes (Signed)
Discharged at 1350 with spouse, ambulatory in no distress.

## 2014-03-10 ENCOUNTER — Ambulatory Visit (HOSPITAL_BASED_OUTPATIENT_CLINIC_OR_DEPARTMENT_OTHER): Payer: Medicare Other

## 2014-03-10 VITALS — BP 109/56 | HR 65 | Temp 98.3°F | Resp 18

## 2014-03-10 DIAGNOSIS — C9200 Acute myeloblastic leukemia, not having achieved remission: Secondary | ICD-10-CM

## 2014-03-10 DIAGNOSIS — Z5111 Encounter for antineoplastic chemotherapy: Secondary | ICD-10-CM

## 2014-03-10 MED ORDER — ONDANSETRON HCL 8 MG PO TABS
8.0000 mg | ORAL_TABLET | Freq: Once | ORAL | Status: AC
  Administered 2014-03-10: 8 mg via ORAL

## 2014-03-10 MED ORDER — ONDANSETRON HCL 8 MG PO TABS
ORAL_TABLET | ORAL | Status: AC
  Filled 2014-03-10: qty 1

## 2014-03-10 MED ORDER — AZACITIDINE CHEMO SQ INJECTION
100.0000 mg/m2 | Freq: Once | INTRAMUSCULAR | Status: AC
Start: 2014-03-10 — End: 2014-03-10
  Administered 2014-03-10: 200 mg via SUBCUTANEOUS
  Filled 2014-03-10: qty 8

## 2014-03-10 NOTE — Progress Notes (Signed)
Discharged at 1445, alone, ambulatory in no distress.

## 2014-03-10 NOTE — Patient Instructions (Signed)
[place] Discharge Instructions for Patients Receiving Chemotherapy  Today you received the following chemotherapy agents Vidaza.  To help prevent nausea and vomiting after your treatment, we encourage you to take your nausea medication ondansetron 8 mg as ordered by Dr. Alvy Bimler.   If you develop nausea and vomiting that is not controlled by your nausea medication, call the clinic.   BELOW ARE SYMPTOMS THAT SHOULD BE REPORTED IMMEDIATELY:  *FEVER GREATER THAN 100.5 F  *CHILLS WITH OR WITHOUT FEVER  NAUSEA AND VOMITING THAT IS NOT CONTROLLED WITH YOUR NAUSEA MEDICATION  *UNUSUAL SHORTNESS OF BREATH  *UNUSUAL BRUISING OR BLEEDING  TENDERNESS IN MOUTH AND THROAT WITH OR WITHOUT PRESENCE OF ULCERS  *URINARY PROBLEMS  *BOWEL PROBLEMS  UNUSUAL RASH Items with * indicate a potential emergency and should be followed up as soon as possible.  Feel free to call the clinic you have any questions or concerns. The clinic phone number is [phone].

## 2014-03-11 ENCOUNTER — Ambulatory Visit (HOSPITAL_BASED_OUTPATIENT_CLINIC_OR_DEPARTMENT_OTHER): Payer: Medicare Other

## 2014-03-11 VITALS — BP 123/53 | HR 63 | Temp 97.4°F | Resp 18

## 2014-03-11 DIAGNOSIS — C9200 Acute myeloblastic leukemia, not having achieved remission: Secondary | ICD-10-CM

## 2014-03-11 DIAGNOSIS — Z5111 Encounter for antineoplastic chemotherapy: Secondary | ICD-10-CM

## 2014-03-11 MED ORDER — ONDANSETRON HCL 8 MG PO TABS
ORAL_TABLET | ORAL | Status: AC
  Filled 2014-03-11: qty 1

## 2014-03-11 MED ORDER — AZACITIDINE CHEMO SQ INJECTION
100.0000 mg/m2 | Freq: Once | INTRAMUSCULAR | Status: AC
  Administered 2014-03-11: 200 mg via SUBCUTANEOUS
  Filled 2014-03-11: qty 8

## 2014-03-11 MED ORDER — ONDANSETRON HCL 8 MG PO TABS
8.0000 mg | ORAL_TABLET | Freq: Once | ORAL | Status: AC
  Administered 2014-03-11: 8 mg via ORAL

## 2014-03-11 NOTE — Patient Instructions (Signed)
[facility] Discharge Instructions for Patients Receiving Chemotherapy  Today you received the following chemotherapy agents Vidaza. To help prevent nausea and vomiting after your treatment, we encourage you to take your nausea medication as prescribed.    If you develop nausea and vomiting that is not controlled by your nausea medication, call the clinic.   BELOW ARE SYMPTOMS THAT SHOULD BE REPORTED IMMEDIATELY:  *FEVER GREATER THAN 100.5 F  *CHILLS WITH OR WITHOUT FEVER  NAUSEA AND VOMITING THAT IS NOT CONTROLLED WITH YOUR NAUSEA MEDICATION  *UNUSUAL SHORTNESS OF BREATH  *UNUSUAL BRUISING OR BLEEDING  TENDERNESS IN MOUTH AND THROAT WITH OR WITHOUT PRESENCE OF ULCERS  *URINARY PROBLEMS  *BOWEL PROBLEMS  UNUSUAL RASH Items with * indicate a potential emergency and should be followed up as soon as possible.  Feel free to call the clinic you have any questions or concerns. The clinic phone number is [phone].    

## 2014-03-12 ENCOUNTER — Other Ambulatory Visit (HOSPITAL_BASED_OUTPATIENT_CLINIC_OR_DEPARTMENT_OTHER): Payer: Medicare Other

## 2014-03-12 ENCOUNTER — Other Ambulatory Visit: Payer: Self-pay | Admitting: *Deleted

## 2014-03-12 ENCOUNTER — Ambulatory Visit (HOSPITAL_BASED_OUTPATIENT_CLINIC_OR_DEPARTMENT_OTHER): Payer: Medicare Other

## 2014-03-12 VITALS — BP 109/52 | HR 71 | Temp 97.5°F

## 2014-03-12 DIAGNOSIS — C9202 Acute myeloblastic leukemia, in relapse: Secondary | ICD-10-CM

## 2014-03-12 DIAGNOSIS — Z5111 Encounter for antineoplastic chemotherapy: Secondary | ICD-10-CM

## 2014-03-12 DIAGNOSIS — C9200 Acute myeloblastic leukemia, not having achieved remission: Secondary | ICD-10-CM

## 2014-03-12 LAB — CBC WITH DIFFERENTIAL/PLATELET
BASO%: 0 % (ref 0.0–2.0)
BASOS ABS: 0 10*3/uL (ref 0.0–0.1)
EOS ABS: 0 10*3/uL (ref 0.0–0.5)
EOS%: 2.2 % (ref 0.0–7.0)
HCT: 29 % — ABNORMAL LOW (ref 38.4–49.9)
HGB: 9.9 g/dL — ABNORMAL LOW (ref 13.0–17.1)
LYMPH#: 1.1 10*3/uL (ref 0.9–3.3)
LYMPH%: 78.8 % — ABNORMAL HIGH (ref 14.0–49.0)
MCH: 31.7 pg (ref 27.2–33.4)
MCHC: 34.1 g/dL (ref 32.0–36.0)
MCV: 92.9 fL (ref 79.3–98.0)
MONO#: 0 10*3/uL — ABNORMAL LOW (ref 0.1–0.9)
MONO%: 0.7 % (ref 0.0–14.0)
NEUT%: 18.3 % — ABNORMAL LOW (ref 39.0–75.0)
NEUTROS ABS: 0.3 10*3/uL — AB (ref 1.5–6.5)
Platelets: 11 10*3/uL — ABNORMAL LOW (ref 140–400)
RBC: 3.12 10*6/uL — ABNORMAL LOW (ref 4.20–5.82)
RDW: 19.8 % — ABNORMAL HIGH (ref 11.0–14.6)
WBC: 1.4 10*3/uL — AB (ref 4.0–10.3)
nRBC: 0 % (ref 0–0)

## 2014-03-12 LAB — HOLD TUBE, BLOOD BANK

## 2014-03-12 MED ORDER — AZACITIDINE CHEMO SQ INJECTION
100.0000 mg/m2 | Freq: Once | INTRAMUSCULAR | Status: AC
  Administered 2014-03-12: 200 mg via SUBCUTANEOUS
  Filled 2014-03-12: qty 8

## 2014-03-12 MED ORDER — ONDANSETRON HCL 8 MG PO TABS
8.0000 mg | ORAL_TABLET | Freq: Once | ORAL | Status: AC
  Administered 2014-03-12: 8 mg via ORAL

## 2014-03-12 NOTE — Patient Instructions (Signed)
[facility] for [facility] Receiving Chemotherapy  Today you received the following chemotherapy agents vidaza   To help prevent nausea and vomiting after your treatment, we encourage you to take your nausea medication as directed. If you develop nausea and vomiting that is not controlled by your nausea medication, call the clinic.   BELOW ARE SYMPTOMS THAT SHOULD BE REPORTED IMMEDIATELY:  *FEVER GREATER THAN 100.5 F  *CHILLS WITH OR WITHOUT FEVER  NAUSEA AND VOMITING THAT IS NOT CONTROLLED WITH YOUR NAUSEA MEDICATION  *UNUSUAL SHORTNESS OF BREATH  *UNUSUAL BRUISING OR BLEEDING  TENDERNESS IN MOUTH AND THROAT WITH OR WITHOUT PRESENCE OF ULCERS  *URINARY PROBLEMS  *BOWEL PROBLEMS  UNUSUAL RASH Items with * indicate a potential emergency and should be followed up as soon as possible.  Feel free to call the clinic you have any questions or concerns. The clinic phone number is [phone].  

## 2014-03-13 ENCOUNTER — Ambulatory Visit (HOSPITAL_BASED_OUTPATIENT_CLINIC_OR_DEPARTMENT_OTHER): Payer: Medicare Other

## 2014-03-13 DIAGNOSIS — C9200 Acute myeloblastic leukemia, not having achieved remission: Secondary | ICD-10-CM

## 2014-03-13 DIAGNOSIS — C9202 Acute myeloblastic leukemia, in relapse: Secondary | ICD-10-CM

## 2014-03-13 DIAGNOSIS — Z5111 Encounter for antineoplastic chemotherapy: Secondary | ICD-10-CM

## 2014-03-13 MED ORDER — ONDANSETRON HCL 8 MG PO TABS
ORAL_TABLET | ORAL | Status: AC
  Filled 2014-03-13: qty 1

## 2014-03-13 MED ORDER — ONDANSETRON HCL 8 MG PO TABS
8.0000 mg | ORAL_TABLET | Freq: Once | ORAL | Status: AC
  Administered 2014-03-13: 8 mg via ORAL

## 2014-03-13 MED ORDER — AZACITIDINE CHEMO SQ INJECTION
100.0000 mg/m2 | Freq: Once | INTRAMUSCULAR | Status: AC
  Administered 2014-03-13: 200 mg via SUBCUTANEOUS
  Filled 2014-03-13: qty 8

## 2014-03-16 ENCOUNTER — Ambulatory Visit (HOSPITAL_COMMUNITY)
Admission: RE | Admit: 2014-03-16 | Discharge: 2014-03-16 | Disposition: A | Discharge status: Home / Self Care | Payer: Medicare Other | Source: Ambulatory Visit | Attending: Hematology and Oncology | Admitting: Hematology and Oncology

## 2014-03-16 ENCOUNTER — Ambulatory Visit: Payer: Medicare Other | Admitting: Hematology and Oncology

## 2014-03-16 ENCOUNTER — Encounter: Payer: Self-pay | Admitting: Hematology and Oncology

## 2014-03-16 ENCOUNTER — Other Ambulatory Visit: Payer: Medicare Other

## 2014-03-16 ENCOUNTER — Other Ambulatory Visit (HOSPITAL_BASED_OUTPATIENT_CLINIC_OR_DEPARTMENT_OTHER): Payer: Medicare Other

## 2014-03-16 ENCOUNTER — Ambulatory Visit (HOSPITAL_BASED_OUTPATIENT_CLINIC_OR_DEPARTMENT_OTHER): Payer: Medicare Other

## 2014-03-16 ENCOUNTER — Other Ambulatory Visit: Payer: Self-pay | Admitting: Hematology and Oncology

## 2014-03-16 VITALS — BP 115/64 | HR 65 | Temp 97.7°F | Resp 18

## 2014-03-16 DIAGNOSIS — C9200 Acute myeloblastic leukemia, not having achieved remission: Secondary | ICD-10-CM | POA: Insufficient documentation

## 2014-03-16 DIAGNOSIS — D649 Anemia, unspecified: Secondary | ICD-10-CM | POA: Insufficient documentation

## 2014-03-16 DIAGNOSIS — D696 Thrombocytopenia, unspecified: Secondary | ICD-10-CM | POA: Insufficient documentation

## 2014-03-16 DIAGNOSIS — C9202 Acute myeloblastic leukemia, in relapse: Secondary | ICD-10-CM

## 2014-03-16 DIAGNOSIS — Z5111 Encounter for antineoplastic chemotherapy: Secondary | ICD-10-CM

## 2014-03-16 LAB — CBC WITH DIFFERENTIAL/PLATELET
BASO%: 0 % (ref 0.0–2.0)
Basophils Absolute: 0 10*3/uL (ref 0.0–0.1)
EOS%: 1.5 % (ref 0.0–7.0)
Eosinophils Absolute: 0 10*3/uL (ref 0.0–0.5)
HCT: 28.7 % — ABNORMAL LOW (ref 38.4–49.9)
HGB: 9.9 g/dL — ABNORMAL LOW (ref 13.0–17.1)
LYMPH#: 1.1 10*3/uL (ref 0.9–3.3)
LYMPH%: 83.2 % — ABNORMAL HIGH (ref 14.0–49.0)
MCH: 31.8 pg (ref 27.2–33.4)
MCHC: 34.5 g/dL (ref 32.0–36.0)
MCV: 92.3 fL (ref 79.3–98.0)
MONO#: 0 10*3/uL — AB (ref 0.1–0.9)
MONO%: 0.7 % (ref 0.0–14.0)
NEUT#: 0.2 10*3/uL — CL (ref 1.5–6.5)
NEUT%: 14.6 % — AB (ref 39.0–75.0)
Platelets: 10 10*3/uL — CL (ref 140–400)
RBC: 3.11 10*6/uL — AB (ref 4.20–5.82)
RDW: 19.2 % — AB (ref 11.0–14.6)
WBC: 1.4 10*3/uL — ABNORMAL LOW (ref 4.0–10.3)

## 2014-03-16 LAB — COMPREHENSIVE METABOLIC PANEL (CC13)
ALBUMIN: 3.4 g/dL — AB (ref 3.5–5.0)
ALT: 14 U/L (ref 0–55)
AST: 11 U/L (ref 5–34)
Alkaline Phosphatase: 56 U/L (ref 40–150)
Anion Gap: 7 mEq/L (ref 3–11)
BUN: 35.7 mg/dL — ABNORMAL HIGH (ref 7.0–26.0)
CHLORIDE: 111 meq/L — AB (ref 98–109)
CO2: 24 mEq/L (ref 22–29)
CREATININE: 2 mg/dL — AB (ref 0.7–1.3)
Calcium: 9.5 mg/dL (ref 8.4–10.4)
Glucose: 84 mg/dl (ref 70–140)
POTASSIUM: 4.3 meq/L (ref 3.5–5.1)
Sodium: 141 mEq/L (ref 136–145)
Total Bilirubin: 0.37 mg/dL (ref 0.20–1.20)
Total Protein: 6 g/dL — ABNORMAL LOW (ref 6.4–8.3)

## 2014-03-16 LAB — HOLD TUBE, BLOOD BANK

## 2014-03-16 MED ORDER — SODIUM CHLORIDE 0.9 % IJ SOLN
10.0000 mL | INTRAMUSCULAR | Status: AC | PRN
  Administered 2014-03-16: 10 mL
  Filled 2014-03-16: qty 10

## 2014-03-16 MED ORDER — ONDANSETRON HCL 8 MG PO TABS
ORAL_TABLET | ORAL | Status: AC
  Filled 2014-03-16: qty 1

## 2014-03-16 MED ORDER — SODIUM CHLORIDE 0.9 % IV SOLN
250.0000 mL | Freq: Once | INTRAVENOUS | Status: AC
  Administered 2014-03-16: 250 mL via INTRAVENOUS

## 2014-03-16 MED ORDER — AZACITIDINE CHEMO SQ INJECTION
100.0000 mg/m2 | Freq: Once | INTRAMUSCULAR | Status: AC
  Administered 2014-03-16: 200 mg via SUBCUTANEOUS
  Filled 2014-03-16: qty 8

## 2014-03-16 MED ORDER — ONDANSETRON HCL 8 MG PO TABS
8.0000 mg | ORAL_TABLET | Freq: Once | ORAL | Status: AC
  Administered 2014-03-16: 8 mg via ORAL

## 2014-03-16 MED ORDER — HEPARIN SOD (PORK) LOCK FLUSH 100 UNIT/ML IV SOLN
500.0000 [IU] | Freq: Every day | INTRAVENOUS | Status: AC | PRN
  Administered 2014-03-16: 500 [IU]
  Filled 2014-03-16: qty 5

## 2014-03-16 NOTE — Progress Notes (Signed)
Ok to treat with platelets of 10. Will transfuse platelets today per Dr Alvy Bimler.

## 2014-03-16 NOTE — Patient Instructions (Signed)
Platelet Transfusion Information This is information about transfusions of platelets. Platelets are tiny cells made by the bone marrow and found in the blood. When a blood vessel is damaged platelets rush to the damaged area to help form a clot. This begins the healing process. When platelets get very low your blood may have trouble clotting. This may be from:  Illness.  Blood disorder.  Chemotherapy to treat cancer. Often lower platelet counts do not usually cause problems.  Platelets usually last for 7 to 10 days. If they are not used not used in an injury, they are broken down by the liver or spleen. Symptoms of low platelet count include:  Nosebleeds.  Bleeding gums.  Heavy periods.  Bruising and tiny blood spots in the skin.  Pin point spots of bleeding are called (petechiae).  Larger bruises (purpura).  Bleeding can be more serious if it happens in the brain or bowel. Platelet transfusions are often used to keep the platelet count at an acceptable level. Serious bleeding due to low platelets is uncommon. RISKS AND COMPLICATIONS Severe side effects from platelet transfusions are uncommon. Minor reactions may include:  Itching.  Rashes.  High temperature and shivering. Medications are available to stop transfusion reactions. Let your caregivers know if you develop any of the above problems.  If you are having platelet transfusions frequently they may get less effective. This is called becoming refractory to platelets. It is uncommon. This can happen from non-immune causes and immune causes. Non-immune causes include:  High temperatures.  Some medications.  An enlarged spleen. Immune causes happen when your body discovers the platelets are not your own and begin making antibodies against them. The antibodies kill the platelets quickly. Even with platelet transfusions you may still notice problems with bleeding or bruising. Let your caregivers know about this. Other things  can be done to help if this happens.  BEFORE THE PROCEDURE   Your doctors will check your platelet count regularly.  If the platelet count is too low it may be necessary to have a platelet transfusion.  This is more important before certain procedures with a risk of bleeding such as a spinal tap.  Platelet transfusion reduces the risk of bleeding during or after the procedure.  Except in emergencies, giving a transfusion requires a written consent. Before blood is taken from a donor, a complete history is taken to make sure the person has no history of previous diseases, nor engages in risky social behavior. Examples of this are intravenous drug use or sexual activity with multiple partners. This could lead to infected blood or blood products being used. This history is done even in spite of the extensive testing to make sure the blood is safe. All blood products transfused are tested to make sure it is a match for the person getting the blood. It is also checked for infections. Blood is the safest it has ever been. The risk of getting an infection is very low. PROCEDURE  The platelets are stored in small plastic bags which are kept at a low temperature.  Each bag is called a unit and sometimes two units are given. They are given through an intravenous line by drip infusion over about one half hour.  Usually blood is collected from multiple people to get enough to transfuse.  Sometimes, the platelets are collected from a single person. This is done using a special machine that separates the platelets from the blood. The machine is called an apheresis machine. Platelets collected   in this way are called apheresed platelets. Apheresed platelets reduce the risk of becoming sensitive to the platelets. This lowers the chances of having a transfusion reaction.  As it only takes a short time to give the platelets, this treatment can be given in an outpatients department. Platelets can also be given  before or after other treatments. SEEK IMMEDIATE MEDICAL CARE IF: Any of the following symptoms over the next 12 hours or several days:  Shaking chills.  Fever with a temperature greater than 102 F (38.9 C) develops.  Back pain or muscle pain.  People around you feel you are not acting correctly, or you are confused.  Blood in the urine or bowel movements or bleeding from any place in your body.  Shortness of breath, or difficulty breathing.  Dizziness.  Fainting.  You break out in a rash or develop hives.  You have a decrease in the amount of urine you are putting out, or the urine turns a dark color or changes to pink, red, or [place].  A severe headache or stiff neck.  Bruising more easily. Document Released: 10/08/2007 Document Revised: 03/04/2012 Document Reviewed: 10/08/2007 [facility] Patient Information 2014 [place]. [place] Discharge Instructions for Patients Receiving Chemotherapy  Today you received the following chemotherapy agents Vidaza.  To help prevent nausea and vomiting after your treatment, we encourage you to take your nausea medication.   If you develop nausea and vomiting that is not controlled by your nausea medication, call the clinic.   BELOW ARE SYMPTOMS THAT SHOULD BE REPORTED IMMEDIATELY:  *FEVER GREATER THAN 100.5 F  *CHILLS WITH OR WITHOUT FEVER  NAUSEA AND VOMITING THAT IS NOT CONTROLLED WITH YOUR NAUSEA MEDICATION  *UNUSUAL SHORTNESS OF BREATH  *UNUSUAL BRUISING OR BLEEDING  TENDERNESS IN MOUTH AND THROAT WITH OR WITHOUT PRESENCE OF ULCERS  *URINARY PROBLEMS  *BOWEL PROBLEMS  UNUSUAL RASH Items with * indicate a potential emergency and should be followed up as soon as possible.  Feel free to call the clinic you have any questions or concerns. The clinic phone number is [phone].

## 2014-03-17 ENCOUNTER — Ambulatory Visit (HOSPITAL_BASED_OUTPATIENT_CLINIC_OR_DEPARTMENT_OTHER): Payer: Medicare Other

## 2014-03-17 VITALS — BP 121/61 | HR 65 | Temp 97.7°F

## 2014-03-17 DIAGNOSIS — Z5111 Encounter for antineoplastic chemotherapy: Secondary | ICD-10-CM

## 2014-03-17 DIAGNOSIS — C9202 Acute myeloblastic leukemia, in relapse: Secondary | ICD-10-CM

## 2014-03-17 DIAGNOSIS — C9200 Acute myeloblastic leukemia, not having achieved remission: Secondary | ICD-10-CM

## 2014-03-17 LAB — PREPARE PLATELET PHERESIS: Unit division: 0

## 2014-03-17 MED ORDER — ONDANSETRON HCL 8 MG PO TABS
8.0000 mg | ORAL_TABLET | Freq: Once | ORAL | Status: AC
  Administered 2014-03-17: 8 mg via ORAL

## 2014-03-17 MED ORDER — AZACITIDINE CHEMO SQ INJECTION
100.0000 mg/m2 | Freq: Once | INTRAMUSCULAR | Status: AC
  Administered 2014-03-17: 200 mg via SUBCUTANEOUS
  Filled 2014-03-17: qty 8

## 2014-03-17 MED ORDER — ONDANSETRON HCL 8 MG PO TABS
ORAL_TABLET | ORAL | Status: AC
  Filled 2014-03-17: qty 1

## 2014-03-17 NOTE — Patient Instructions (Signed)
[facility] for [facility] Receiving Chemotherapy  Today you received the following chemotherapy agents vidaza   To help prevent nausea and vomiting after your treatment, we encourage you to take your nausea medication as directed. If you develop nausea and vomiting that is not controlled by your nausea medication, call the clinic.   BELOW ARE SYMPTOMS THAT SHOULD BE REPORTED IMMEDIATELY:  *FEVER GREATER THAN 100.5 F  *CHILLS WITH OR WITHOUT FEVER  NAUSEA AND VOMITING THAT IS NOT CONTROLLED WITH YOUR NAUSEA MEDICATION  *UNUSUAL SHORTNESS OF BREATH  *UNUSUAL BRUISING OR BLEEDING  TENDERNESS IN MOUTH AND THROAT WITH OR WITHOUT PRESENCE OF ULCERS  *URINARY PROBLEMS  *BOWEL PROBLEMS  UNUSUAL RASH Items with * indicate a potential emergency and should be followed up as soon as possible.  Feel free to call the clinic you have any questions or concerns. The clinic phone number is [phone].  

## 2014-03-18 ENCOUNTER — Ambulatory Visit: Payer: Medicare Other

## 2014-03-19 ENCOUNTER — Ambulatory Visit: Payer: Medicare Other

## 2014-03-19 ENCOUNTER — Telehealth: Payer: Self-pay | Admitting: Hematology and Oncology

## 2014-03-19 ENCOUNTER — Ambulatory Visit (HOSPITAL_BASED_OUTPATIENT_CLINIC_OR_DEPARTMENT_OTHER): Payer: Medicare Other | Admitting: Hematology and Oncology

## 2014-03-19 ENCOUNTER — Encounter: Payer: Self-pay | Admitting: Hematology and Oncology

## 2014-03-19 ENCOUNTER — Other Ambulatory Visit (HOSPITAL_BASED_OUTPATIENT_CLINIC_OR_DEPARTMENT_OTHER): Payer: Medicare Other

## 2014-03-19 VITALS — BP 133/68 | HR 78 | Temp 97.9°F | Resp 18 | Ht 68.0 in | Wt 187.8 lb

## 2014-03-19 DIAGNOSIS — C9200 Acute myeloblastic leukemia, not having achieved remission: Secondary | ICD-10-CM

## 2014-03-19 DIAGNOSIS — C9202 Acute myeloblastic leukemia, in relapse: Secondary | ICD-10-CM

## 2014-03-19 DIAGNOSIS — D696 Thrombocytopenia, unspecified: Secondary | ICD-10-CM

## 2014-03-19 DIAGNOSIS — N189 Chronic kidney disease, unspecified: Secondary | ICD-10-CM

## 2014-03-19 DIAGNOSIS — Z6828 Body mass index (BMI) 28.0-28.9, adult: Secondary | ICD-10-CM

## 2014-03-19 LAB — CBC WITH DIFFERENTIAL/PLATELET
BASO%: 0 % (ref 0.0–2.0)
BASOS ABS: 0 10*3/uL (ref 0.0–0.1)
EOS%: 2.2 % (ref 0.0–7.0)
Eosinophils Absolute: 0 10*3/uL (ref 0.0–0.5)
HCT: 27 % — ABNORMAL LOW (ref 38.4–49.9)
HGB: 9.2 g/dL — ABNORMAL LOW (ref 13.0–17.1)
LYMPH%: 85.8 % — ABNORMAL HIGH (ref 14.0–49.0)
MCH: 31.4 pg (ref 27.2–33.4)
MCHC: 34.1 g/dL (ref 32.0–36.0)
MCV: 92.2 fL (ref 79.3–98.0)
MONO#: 0 10*3/uL — ABNORMAL LOW (ref 0.1–0.9)
MONO%: 0.7 % (ref 0.0–14.0)
NEUT#: 0.2 10*3/uL — CL (ref 1.5–6.5)
NEUT%: 11.3 % — ABNORMAL LOW (ref 39.0–75.0)
Platelets: 24 10*3/uL — ABNORMAL LOW (ref 140–400)
RBC: 2.93 10*6/uL — ABNORMAL LOW (ref 4.20–5.82)
RDW: 19 % — AB (ref 11.0–14.6)
WBC: 1.3 10*3/uL — ABNORMAL LOW (ref 4.0–10.3)
lymph#: 1.2 10*3/uL (ref 0.9–3.3)
nRBC: 0 % (ref 0–0)

## 2014-03-19 LAB — HOLD TUBE, BLOOD BANK

## 2014-03-19 NOTE — Progress Notes (Signed)
[facility] OFFICE PROGRESS NOTE  Patient Care Team: Precious Reel, MD as PCP - General Bernestine Amass, MD (Urology) Ladene Artist, MD (Gastroenterology) Burnell Blanks, MD (Cardiology) Heath Lark, MD as Consulting Physician (Hematology and Oncology)  DIAGNOSIS: Recurrent AML, ongoing palliative chemotherapy  SUMMARY OF ONCOLOGIC HISTORY: This is a very pleasant gentleman with prior history of pancytopenia, was first seen in October 2013. Since then, he had multiple bone marrow aspirate and biopsy. His initial bone marrow biopsy in October 2012 showed 20% blast, suspicious for AML. Due to his age, he was offered Vidaza. In May of 2014, repeat bone marrow aspirate and biopsy show his blast count has reduced to about 9%. In November 2014, repeat bone marrow aspirate and biopsy confirmed complete remission. Between November 2014 to February 2015, he had multiple dosage adjustment to his treatment due to pancytopenia. On 02/23/2014, repeat bone marrow aspirate and biopsy showed progressive leukemia. The patient went to [facility] for a second opinion and subsequently return here for palliative treatment. On 03/09/2014, we restarted him on Vidaza at 100 mg/m2 On 03/16/2014, the patient received one unit of a pheresis platelets. INTERVAL HISTORY: Kyle Brewer 80 y.o. male returns for further followup. He complained of easy bruising. He complained of difficulties with urination. He has regular drenching night sweats. He felt thirsty. He denies any recent fever, chills, night sweats or abnormal weight loss The patient denies any recent signs or symptoms of bleeding such as spontaneous epistaxis, hematuria or hematochezia.  I have reviewed the past medical history, past surgical history, social history and family history with the patient and they are unchanged from previous note.  ALLERGIES:  has No Known Allergies.  MEDICATIONS:  Current Outpatient Prescriptions   Medication Sig Dispense Refill  . alendronate (FOSAMAX) 70 MG tablet Take 70 mg by mouth every Monday. Take with a full glass of water on an empty stomach.      Marland Kitchen amLODipine (NORVASC) 5 MG tablet Take 1 tablet (5 mg total) by mouth daily before breakfast.  90 tablet  3  . Calcium Carbonate-Vitamin D (CALCIUM 600 + D PO) Take 1 tablet by mouth 2 (two) times daily.      . finasteride (PROSCAR) 5 MG tablet Take 5 mg by mouth daily.      Marland Kitchen levothyroxine (SYNTHROID, LEVOTHROID) 50 MCG tablet Take 50 mcg by mouth every evening.       Marland Kitchen lisinopril (PRINIVIL,ZESTRIL) 10 MG tablet Take 10 mg by mouth every evening.       Marland Kitchen LORazepam (ATIVAN) 0.5 MG tablet Take 0.5 mg by mouth at bedtime as needed for anxiety (sleep).       . metoprolol succinate (TOPROL-XL) 100 MG 24 hr tablet Take 1 tablet (100 mg total) by mouth daily before breakfast. Take with or immediately following a meal.  90 tablet  3  . Multiple Vitamin (MULTIVITAMIN WITH MINERALS) TABS Take 1 tablet by mouth daily.      . ondansetron (ZOFRAN) 8 MG tablet Take 1 tablet (8 mg total) by mouth 2 (two) times daily as needed (Nausea or vomiting).  30 tablet  1  . simvastatin (ZOCOR) 20 MG tablet Take 20 mg by mouth at bedtime.        Marland Kitchen terazosin (HYTRIN) 10 MG capsule Take 10 mg by mouth at bedtime.      . vitamin B-12 (CYANOCOBALAMIN) 1000 MCG tablet Take 1,000 mcg by mouth daily.      Marland Kitchen  vitamin C (ASCORBIC ACID) 500 MG tablet Take 500 mg by mouth daily.      Marland Kitchen acyclovir (ZOVIRAX) 400 MG tablet Take 1 tablet (400 mg total) by mouth daily.  30 tablet  6  . ciprofloxacin (CIPRO) 250 MG tablet Take 1 tablet (250 mg total) by mouth 2 (two) times daily.  14 tablet  0   No current facility-administered medications for this visit.    REVIEW OF SYSTEMS:   Constitutional: Denies fevers, chills or abnormal weight loss Eyes: Denies blurriness of vision Ears, nose, mouth, throat, and face: Denies mucositis or sore throat Respiratory: Denies cough,  dyspnea or wheezes Cardiovascular: Denies palpitation, chest discomfort or lower extremity swelling Gastrointestinal:  Denies nausea, heartburn or change in bowel habits Lymphatics: Denies new lymphadenopathy  Neurological:Denies numbness, tingling or new weaknesses Behavioral/Psych: Mood is stable, no new changes  All other systems were reviewed with the patient and are negative.  PHYSICAL EXAMINATION: ECOG PERFORMANCE STATUS: 1 - Symptomatic but completely ambulatory  Filed Vitals:   03/19/14 1034  BP: 133/68  Pulse: 78  Temp: 97.9 F (36.6 C)  Resp: 18   Filed Weights   03/19/14 1034  Weight: 187 lb 12.8 oz (85.186 kg)    GENERAL:alert, no distress and comfortable SKIN: Noted extensive bruising. No petechiae. EYES: normal, Conjunctiva are pink and non-injected, sclera clear OROPHARYNX:no exudate, no erythema and lips, buccal mucosa, and tongue normal  NECK: supple, thyroid normal size, non-tender, without nodularity LYMPH:  no palpable lymphadenopathy in the cervical, axillary or inguinal LUNGS: clear to auscultation and percussion with normal breathing effort HEART: regular rate & rhythm and no murmurs and no lower extremity edema ABDOMEN:abdomen soft, non-tender and normal bowel sounds Musculoskeletal:no cyanosis of digits and no clubbing  NEURO: alert & oriented x 3 with fluent speech, no focal motor/sensory deficits  LABORATORY DATA:  I have reviewed the data as listed    Component Value Date/Time   NA 141 03/16/2014 0945   NA 140 11/04/2012 1201   K 4.3 03/16/2014 0945   K 4.0 11/04/2012 1201   CL 111* 06/18/2013 0854   CL 107 11/04/2012 1201   CO2 24 03/16/2014 0945   CO2 26 11/04/2012 1201   GLUCOSE 84 03/16/2014 0945   GLUCOSE 117* 06/18/2013 0854   GLUCOSE 85 11/04/2012 1201   BUN 35.7* 03/16/2014 0945   BUN 26* 11/04/2012 1201   CREATININE 2.0* 03/16/2014 0945   CREATININE 1.56* 11/04/2012 1201   CALCIUM 9.5 03/16/2014 0945   CALCIUM 9.9 11/04/2012 1201    PROT 6.0* 03/16/2014 0945   ALBUMIN 3.4* 03/16/2014 0945   AST 11 03/16/2014 0945   ALT 14 03/16/2014 0945   ALKPHOS 56 03/16/2014 0945   BILITOT 0.37 03/16/2014 0945   GFRNONAA 39* 11/04/2012 1201   GFRAA 46* 11/04/2012 1201    No results found for this basename: SPEP,  UPEP,   kappa and lambda light chains    Lab Results  Component Value Date   WBC 1.3* 03/19/2014   NEUTROABS 0.2* 03/19/2014   HGB 9.2* 03/19/2014   HCT 27.0* 03/19/2014   MCV 92.2 03/19/2014   PLT 24* 03/19/2014      Chemistry      Component Value Date/Time   NA 141 03/16/2014 0945   NA 140 11/04/2012 1201   K 4.3 03/16/2014 0945   K 4.0 11/04/2012 1201   CL 111* 06/18/2013 0854   CL 107 11/04/2012 1201   CO2 24 03/16/2014 0945   CO2 26  11/04/2012 1201   BUN 35.7* 03/16/2014 0945   BUN 26* 11/04/2012 1201   CREATININE 2.0* 03/16/2014 0945   CREATININE 1.56* 11/04/2012 1201      Component Value Date/Time   CALCIUM 9.5 03/16/2014 0945   CALCIUM 9.9 11/04/2012 1201   ALKPHOS 56 03/16/2014 0945   AST 11 03/16/2014 0945   ALT 14 03/16/2014 0945   BILITOT 0.37 03/16/2014 0945     ASSESSMENT & PLAN:  #1 AML His prognosis is very poor. The patient has complex cytogenetics. We discussed about advanced directives and living will. The appointment his son as the medical power of attorney. The patient clarified that his status is DO NOT RESUSCITATE/DO NOT INTUBATE. He has completed one cycle of chemotherapy this week. He will continue regular blood count monitoring twice a week. I will see him every other week. #2 Anemia #3 Severe Leukopenia #4 Severe Thrombocytopenia His abnormal blood count is related to underlying disease. If the patient's hemoglobin is less than 7 g, we will give him 2 units of irradiated dlood. If his platelet count is less than 10,000 or if he has any signs of active bleeding regardless of his platelet count, he will be transfused 1 unit of irradiated platelets. I recommend him to take ciprofloxacin  and acyclovir as antimicrobial prophylaxis. The patient is recommended to continue holding off taking aspirin. #5 feeling thirsty #6 Regular night sweats The night sweats is related to his disease. I recommend the patient to increase oral hydration. #7 chronic kidney disease This has been stable and we will continue close monitoring. Orders Placed This Encounter  Procedures  . CBC with Differential    Standing Status: Future     Number of Occurrences:      Standing Expiration Date: 03/19/2015  . Comprehensive metabolic panel    Standing Status: Future     Number of Occurrences:      Standing Expiration Date: 03/19/2015   All questions were answered. The patient knows to call the clinic with any problems, questions or concerns. No barriers to learning was detected. I spent 25 minutes counseling the patient face to face. The total time spent in the appointment was 30 minutes and more than 50% was on counseling and review of test results     [facility], [place], MD 03/19/2014 1:52 PM

## 2014-03-19 NOTE — Telephone Encounter (Signed)
gv and printed appt sched and avs forpt for April....sed added tx. °

## 2014-03-20 ENCOUNTER — Other Ambulatory Visit: Payer: Self-pay | Admitting: *Deleted

## 2014-03-20 ENCOUNTER — Ambulatory Visit: Payer: Medicare Other

## 2014-03-20 MED ORDER — LIDOCAINE-PRILOCAINE 2.5-2.5 % EX CREA: 1.0000 "application " | TOPICAL_CREAM | CUTANEOUS | Status: AC | PRN

## 2014-03-23 ENCOUNTER — Other Ambulatory Visit (HOSPITAL_BASED_OUTPATIENT_CLINIC_OR_DEPARTMENT_OTHER): Payer: Medicare Other

## 2014-03-23 DIAGNOSIS — D696 Thrombocytopenia, unspecified: Secondary | ICD-10-CM

## 2014-03-23 DIAGNOSIS — C9200 Acute myeloblastic leukemia, not having achieved remission: Secondary | ICD-10-CM

## 2014-03-23 LAB — CBC WITH DIFFERENTIAL/PLATELET
BASO%: 0 % (ref 0.0–2.0)
Basophils Absolute: 0 10*3/uL (ref 0.0–0.1)
EOS%: 1.6 % (ref 0.0–7.0)
Eosinophils Absolute: 0 10*3/uL (ref 0.0–0.5)
HEMATOCRIT: 26.2 % — AB (ref 38.4–49.9)
HGB: 9 g/dL — ABNORMAL LOW (ref 13.0–17.1)
LYMPH%: 86.6 % — AB (ref 14.0–49.0)
MCH: 31.5 pg (ref 27.2–33.4)
MCHC: 34.4 g/dL (ref 32.0–36.0)
MCV: 91.6 fL (ref 79.3–98.0)
MONO#: 0 10*3/uL — ABNORMAL LOW (ref 0.1–0.9)
MONO%: 0.8 % (ref 0.0–14.0)
NEUT#: 0.1 10*3/uL — CL (ref 1.5–6.5)
NEUT%: 11 % — AB (ref 39.0–75.0)
Platelets: 11 10*3/uL — ABNORMAL LOW (ref 140–400)
RBC: 2.86 10*6/uL — ABNORMAL LOW (ref 4.20–5.82)
RDW: 18.6 % — ABNORMAL HIGH (ref 11.0–14.6)
WBC: 1.3 10*3/uL — ABNORMAL LOW (ref 4.0–10.3)
lymph#: 1.1 10*3/uL (ref 0.9–3.3)
nRBC: 0 % (ref 0–0)

## 2014-03-23 LAB — HOLD TUBE, BLOOD BANK

## 2014-03-26 ENCOUNTER — Ambulatory Visit (HOSPITAL_BASED_OUTPATIENT_CLINIC_OR_DEPARTMENT_OTHER): Payer: Medicare Other

## 2014-03-26 ENCOUNTER — Other Ambulatory Visit: Payer: Self-pay | Admitting: Hematology and Oncology

## 2014-03-26 ENCOUNTER — Other Ambulatory Visit (HOSPITAL_BASED_OUTPATIENT_CLINIC_OR_DEPARTMENT_OTHER): Payer: Medicare Other

## 2014-03-26 ENCOUNTER — Ambulatory Visit (HOSPITAL_COMMUNITY)
Admission: RE | Admit: 2014-03-26 | Discharge: 2014-03-26 | Disposition: A | Discharge status: Home / Self Care | Payer: Medicare Other | Source: Ambulatory Visit | Attending: Hematology and Oncology | Admitting: Hematology and Oncology

## 2014-03-26 ENCOUNTER — Ambulatory Visit (HOSPITAL_COMMUNITY)
Admission: RE | Admit: 2014-03-26 | Discharge: 2014-03-26 | Disposition: A | Discharge status: Home / Self Care | Payer: Medicare Other | Source: Ambulatory Visit

## 2014-03-26 VITALS — BP 104/53 | HR 63 | Temp 97.6°F | Resp 18

## 2014-03-26 DIAGNOSIS — C9200 Acute myeloblastic leukemia, not having achieved remission: Secondary | ICD-10-CM | POA: Insufficient documentation

## 2014-03-26 DIAGNOSIS — D649 Anemia, unspecified: Secondary | ICD-10-CM | POA: Insufficient documentation

## 2014-03-26 DIAGNOSIS — D696 Thrombocytopenia, unspecified: Secondary | ICD-10-CM | POA: Insufficient documentation

## 2014-03-26 DIAGNOSIS — C9202 Acute myeloblastic leukemia, in relapse: Secondary | ICD-10-CM

## 2014-03-26 LAB — CBC WITH DIFFERENTIAL/PLATELET
BASO%: 0 % (ref 0.0–2.0)
Basophils Absolute: 0 10*3/uL (ref 0.0–0.1)
EOS%: 0.8 % (ref 0.0–7.0)
Eosinophils Absolute: 0 10*3/uL (ref 0.0–0.5)
HCT: 25.2 % — ABNORMAL LOW (ref 38.4–49.9)
HGB: 8.6 g/dL — ABNORMAL LOW (ref 13.0–17.1)
LYMPH%: 92.4 % — ABNORMAL HIGH (ref 14.0–49.0)
MCH: 31.4 pg (ref 27.2–33.4)
MCHC: 34.1 g/dL (ref 32.0–36.0)
MCV: 92 fL (ref 79.3–98.0)
MONO#: 0 10*3/uL — ABNORMAL LOW (ref 0.1–0.9)
MONO%: 1.5 % (ref 0.0–14.0)
NEUT#: 0.1 10*3/uL — CL (ref 1.5–6.5)
NEUT%: 5.3 % — ABNORMAL LOW (ref 39.0–75.0)
NRBC: 0 % (ref 0–0)
PLATELETS: 10 10*3/uL — AB (ref 140–400)
RBC: 2.74 10*6/uL — AB (ref 4.20–5.82)
RDW: 18.7 % — ABNORMAL HIGH (ref 11.0–14.6)
WBC: 1.3 10*3/uL — AB (ref 4.0–10.3)
lymph#: 1.2 10*3/uL (ref 0.9–3.3)

## 2014-03-26 LAB — HOLD TUBE, BLOOD BANK

## 2014-03-26 MED ORDER — SODIUM CHLORIDE 0.9 % IJ SOLN
10.0000 mL | INTRAMUSCULAR | Status: AC | PRN
  Administered 2014-03-26: 10 mL
  Filled 2014-03-26: qty 10

## 2014-03-26 MED ORDER — SODIUM CHLORIDE 0.9 % IV SOLN
250.0000 mL | Freq: Once | INTRAVENOUS | Status: AC
  Administered 2014-03-26: 250 mL via INTRAVENOUS

## 2014-03-26 MED ORDER — HEPARIN SOD (PORK) LOCK FLUSH 100 UNIT/ML IV SOLN
500.0000 [IU] | Freq: Every day | INTRAVENOUS | Status: AC | PRN
  Administered 2014-03-26: 500 [IU]
  Filled 2014-03-26: qty 5

## 2014-03-26 NOTE — Patient Instructions (Signed)
Platelet Transfusion Information This is information about transfusions of platelets. Platelets are tiny cells made by the bone marrow and found in the blood. When a blood vessel is damaged platelets rush to the damaged area to help form a clot. This begins the healing process. When platelets get very low your blood may have trouble clotting. This may be from:  Illness.  Blood disorder.  Chemotherapy to treat cancer. Often lower platelet counts do not usually cause problems.  Platelets usually last for 7 to 10 days. If they are not used not used in an injury, they are broken down by the liver or spleen. Symptoms of low platelet count include:  Nosebleeds.  Bleeding gums.  Heavy periods.  Bruising and tiny blood spots in the skin.  Pin point spots of bleeding are called (petechiae).  Larger bruises (purpura).  Bleeding can be more serious if it happens in the brain or bowel. Platelet transfusions are often used to keep the platelet count at an acceptable level. Serious bleeding due to low platelets is uncommon. RISKS AND COMPLICATIONS Severe side effects from platelet transfusions are uncommon. Minor reactions may include:  Itching.  Rashes.  High temperature and shivering. Medications are available to stop transfusion reactions. Let your caregivers know if you develop any of the above problems.  If you are having platelet transfusions frequently they may get less effective. This is called becoming refractory to platelets. It is uncommon. This can happen from non-immune causes and immune causes. Non-immune causes include:  High temperatures.  Some medications.  An enlarged spleen. Immune causes happen when your body discovers the platelets are not your own and begin making antibodies against them. The antibodies kill the platelets quickly. Even with platelet transfusions you may still notice problems with bleeding or bruising. Let your caregivers know about this. Other things  can be done to help if this happens.  BEFORE THE PROCEDURE   Your doctors will check your platelet count regularly.  If the platelet count is too low it may be necessary to have a platelet transfusion.  This is more important before certain procedures with a risk of bleeding such as a spinal tap.  Platelet transfusion reduces the risk of bleeding during or after the procedure.  Except in emergencies, giving a transfusion requires a written consent. Before blood is taken from a donor, a complete history is taken to make sure the person has no history of previous diseases, nor engages in risky social behavior. Examples of this are intravenous drug use or sexual activity with multiple partners. This could lead to infected blood or blood products being used. This history is done even in spite of the extensive testing to make sure the blood is safe. All blood products transfused are tested to make sure it is a match for the person getting the blood. It is also checked for infections. Blood is the safest it has ever been. The risk of getting an infection is very low. PROCEDURE  The platelets are stored in small plastic bags which are kept at a low temperature.  Each bag is called a unit and sometimes two units are given. They are given through an intravenous line by drip infusion over about one half hour.  Usually blood is collected from multiple people to get enough to transfuse.  Sometimes, the platelets are collected from a single person. This is done using a special machine that separates the platelets from the blood. The machine is called an apheresis machine. Platelets collected   in this way are called apheresed platelets. Apheresed platelets reduce the risk of becoming sensitive to the platelets. This lowers the chances of having a transfusion reaction.  As it only takes a short time to give the platelets, this treatment can be given in an outpatients department. Platelets can also be given  before or after other treatments. SEEK IMMEDIATE MEDICAL CARE IF: Any of the following symptoms over the next 12 hours or several days:  Shaking chills.  Fever with a temperature greater than 102 F (38.9 C) develops.  Back pain or muscle pain.  People around you feel you are not acting correctly, or you are confused.  Blood in the urine or bowel movements or bleeding from any place in your body.  Shortness of breath, or difficulty breathing.  Dizziness.  Fainting.  You break out in a rash or develop hives.  You have a decrease in the amount of urine you are putting out, or the urine turns a dark color or changes to pink, red, or [place].  A severe headache or stiff neck.  Bruising more easily. Document Released: 10/08/2007 Document Revised: 03/04/2012 Document Reviewed: 10/08/2007 [employer] Patient Information 2014 [employer]  

## 2014-03-27 LAB — PREPARE PLATELET PHERESIS: Unit division: 0

## 2014-03-30 ENCOUNTER — Other Ambulatory Visit (HOSPITAL_BASED_OUTPATIENT_CLINIC_OR_DEPARTMENT_OTHER): Payer: Medicare Other

## 2014-03-30 ENCOUNTER — Other Ambulatory Visit: Payer: Medicare Other | Admitting: *Deleted

## 2014-03-30 ENCOUNTER — Ambulatory Visit (HOSPITAL_BASED_OUTPATIENT_CLINIC_OR_DEPARTMENT_OTHER): Payer: Medicare Other

## 2014-03-30 ENCOUNTER — Other Ambulatory Visit: Payer: Self-pay | Admitting: Hematology and Oncology

## 2014-03-30 ENCOUNTER — Other Ambulatory Visit: Payer: Self-pay | Admitting: *Deleted

## 2014-03-30 VITALS — BP 126/59 | HR 62 | Temp 96.8°F | Resp 16

## 2014-03-30 DIAGNOSIS — D696 Thrombocytopenia, unspecified: Secondary | ICD-10-CM

## 2014-03-30 DIAGNOSIS — C9200 Acute myeloblastic leukemia, not having achieved remission: Secondary | ICD-10-CM

## 2014-03-30 DIAGNOSIS — D649 Anemia, unspecified: Secondary | ICD-10-CM | POA: Diagnosis not present

## 2014-03-30 LAB — CBC WITH DIFFERENTIAL/PLATELET
BASO%: 0 % (ref 0.0–2.0)
Basophils Absolute: 0 10*3/uL (ref 0.0–0.1)
EOS%: 1 % (ref 0.0–7.0)
Eosinophils Absolute: 0 10*3/uL (ref 0.0–0.5)
HEMATOCRIT: 24.6 % — AB (ref 38.4–49.9)
HGB: 8.4 g/dL — ABNORMAL LOW (ref 13.0–17.1)
LYMPH%: 91.4 % — AB (ref 14.0–49.0)
MCH: 31.3 pg (ref 27.2–33.4)
MCHC: 34.1 g/dL (ref 32.0–36.0)
MCV: 91.8 fL (ref 79.3–98.0)
MONO#: 0 10*3/uL — AB (ref 0.1–0.9)
MONO%: 1 % (ref 0.0–14.0)
NEUT#: 0.1 10*3/uL — CL (ref 1.5–6.5)
NEUT%: 6.6 % — AB (ref 39.0–75.0)
NRBC: 0 % (ref 0–0)
Platelets: 10 10*3/uL — CL (ref 140–400)
RBC: 2.68 10*6/uL — AB (ref 4.20–5.82)
RDW: 18.6 % — ABNORMAL HIGH (ref 11.0–14.6)
WBC: 1.1 10*3/uL — ABNORMAL LOW (ref 4.0–10.3)
lymph#: 1 10*3/uL (ref 0.9–3.3)

## 2014-03-30 LAB — HOLD TUBE, BLOOD BANK

## 2014-03-30 MED ORDER — SODIUM CHLORIDE 0.9 % IJ SOLN
10.0000 mL | INTRAMUSCULAR | Status: DC | PRN
  Administered 2014-03-30: 10 mL via INTRAVENOUS
  Filled 2014-03-30: qty 10

## 2014-03-30 MED ORDER — SODIUM CHLORIDE 0.9 % IV SOLN
250.0000 mL | Freq: Once | INTRAVENOUS | Status: AC
  Administered 2014-03-30: 250 mL via INTRAVENOUS

## 2014-03-30 MED ORDER — HEPARIN SOD (PORK) LOCK FLUSH 100 UNIT/ML IV SOLN
500.0000 [IU] | Freq: Once | INTRAVENOUS | Status: AC
  Administered 2014-03-30: 500 [IU] via INTRAVENOUS
  Filled 2014-03-30: qty 5

## 2014-03-30 NOTE — Patient Instructions (Signed)
Platelet Transfusion Information This is information about transfusions of platelets. Platelets are tiny cells made by the bone marrow and found in the blood. When a blood vessel is damaged platelets rush to the damaged area to help form a clot. This begins the healing process. When platelets get very low your blood may have trouble clotting. This may be from:  Illness.  Blood disorder.  Chemotherapy to treat cancer. Often lower platelet counts do not usually cause problems.  Platelets usually last for 7 to 10 days. If they are not used not used in an injury, they are broken down by the liver or spleen. Symptoms of low platelet count include:  Nosebleeds.  Bleeding gums.  Heavy periods.  Bruising and tiny blood spots in the skin.  Pin point spots of bleeding are called (petechiae).  Larger bruises (purpura).  Bleeding can be more serious if it happens in the brain or bowel. Platelet transfusions are often used to keep the platelet count at an acceptable level. Serious bleeding due to low platelets is uncommon. RISKS AND COMPLICATIONS Severe side effects from platelet transfusions are uncommon. Minor reactions may include:  Itching.  Rashes.  High temperature and shivering. Medications are available to stop transfusion reactions. Let your caregivers know if you develop any of the above problems.  If you are having platelet transfusions frequently they may get less effective. This is called becoming refractory to platelets. It is uncommon. This can happen from non-immune causes and immune causes. Non-immune causes include:  High temperatures.  Some medications.  An enlarged spleen. Immune causes happen when your body discovers the platelets are not your own and begin making antibodies against them. The antibodies kill the platelets quickly. Even with platelet transfusions you may still notice problems with bleeding or bruising. Let your caregivers know about this. Other things  can be done to help if this happens.  BEFORE THE PROCEDURE   Your doctors will check your platelet count regularly.  If the platelet count is too low it may be necessary to have a platelet transfusion.  This is more important before certain procedures with a risk of bleeding such as a spinal tap.  Platelet transfusion reduces the risk of bleeding during or after the procedure.  Except in emergencies, giving a transfusion requires a written consent. Before blood is taken from a donor, a complete history is taken to make sure the person has no history of previous diseases, nor engages in risky social behavior. Examples of this are intravenous drug use or sexual activity with multiple partners. This could lead to infected blood or blood products being used. This history is done even in spite of the extensive testing to make sure the blood is safe. All blood products transfused are tested to make sure it is a match for the person getting the blood. It is also checked for infections. Blood is the safest it has ever been. The risk of getting an infection is very low. PROCEDURE  The platelets are stored in small plastic bags which are kept at a low temperature.  Each bag is called a unit and sometimes two units are given. They are given through an intravenous line by drip infusion over about one half hour.  Usually blood is collected from multiple people to get enough to transfuse.  Sometimes, the platelets are collected from a single person. This is done using a special machine that separates the platelets from the blood. The machine is called an apheresis machine. Platelets collected   in this way are called apheresed platelets. Apheresed platelets reduce the risk of becoming sensitive to the platelets. This lowers the chances of having a transfusion reaction.  As it only takes a short time to give the platelets, this treatment can be given in an outpatients department. Platelets can also be given  before or after other treatments. SEEK IMMEDIATE MEDICAL CARE IF: Any of the following symptoms over the next 12 hours or several days:  Shaking chills.  Fever with a temperature greater than 102 F (38.9 C) develops.  Back pain or muscle pain.  People around you feel you are not acting correctly, or you are confused.  Blood in the urine or bowel movements or bleeding from any place in your body.  Shortness of breath, or difficulty breathing.  Dizziness.  Fainting.  You break out in a rash or develop hives.  You have a decrease in the amount of urine you are putting out, or the urine turns a dark color or changes to pink, red, or Rickles.  A severe headache or stiff neck.  Bruising more easily. Document Released: 10/08/2007 Document Revised: 03/04/2012 Document Reviewed: 10/08/2007 Exit[employer] Patient Information 2014 [employer].  

## 2014-04-02 ENCOUNTER — Telehealth: Payer: Self-pay | Admitting: Hematology and Oncology

## 2014-04-02 ENCOUNTER — Other Ambulatory Visit (HOSPITAL_BASED_OUTPATIENT_CLINIC_OR_DEPARTMENT_OTHER): Payer: Medicare Other

## 2014-04-02 ENCOUNTER — Ambulatory Visit (HOSPITAL_BASED_OUTPATIENT_CLINIC_OR_DEPARTMENT_OTHER): Payer: Medicare Other | Admitting: Hematology and Oncology

## 2014-04-02 VITALS — BP 128/65 | HR 68 | Temp 98.0°F | Resp 18 | Ht 68.0 in | Wt 185.6 lb

## 2014-04-02 DIAGNOSIS — N183 Chronic kidney disease, stage 3 unspecified: Secondary | ICD-10-CM

## 2014-04-02 DIAGNOSIS — C9200 Acute myeloblastic leukemia, not having achieved remission: Secondary | ICD-10-CM

## 2014-04-02 DIAGNOSIS — D539 Nutritional anemia, unspecified: Secondary | ICD-10-CM

## 2014-04-02 DIAGNOSIS — D649 Anemia, unspecified: Secondary | ICD-10-CM

## 2014-04-02 DIAGNOSIS — N189 Chronic kidney disease, unspecified: Secondary | ICD-10-CM

## 2014-04-02 DIAGNOSIS — D72819 Decreased white blood cell count, unspecified: Secondary | ICD-10-CM

## 2014-04-02 DIAGNOSIS — D696 Thrombocytopenia, unspecified: Secondary | ICD-10-CM

## 2014-04-02 LAB — CBC WITH DIFFERENTIAL/PLATELET
BASO%: 0 % (ref 0.0–2.0)
Basophils Absolute: 0 10*3/uL (ref 0.0–0.1)
EOS ABS: 0 10*3/uL (ref 0.0–0.5)
EOS%: 0 % (ref 0.0–7.0)
HCT: 24.9 % — ABNORMAL LOW (ref 38.4–49.9)
HGB: 8.6 g/dL — ABNORMAL LOW (ref 13.0–17.1)
LYMPH%: 89.8 % — ABNORMAL HIGH (ref 14.0–49.0)
MCH: 31.7 pg (ref 27.2–33.4)
MCHC: 34.5 g/dL (ref 32.0–36.0)
MCV: 91.9 fL (ref 79.3–98.0)
MONO#: 0 10*3/uL — AB (ref 0.1–0.9)
MONO%: 3.1 % (ref 0.0–14.0)
NEUT#: 0.1 10*3/uL — CL (ref 1.5–6.5)
NEUT%: 7.1 % — ABNORMAL LOW (ref 39.0–75.0)
NRBC: 0 % (ref 0–0)
PLATELETS: 13 10*3/uL — AB (ref 140–400)
RBC: 2.71 10*6/uL — AB (ref 4.20–5.82)
RDW: 18.4 % — AB (ref 11.0–14.6)
WBC: 1.3 10*3/uL — AB (ref 4.0–10.3)
lymph#: 1.2 10*3/uL (ref 0.9–3.3)

## 2014-04-02 LAB — HOLD TUBE, BLOOD BANK

## 2014-04-02 NOTE — Telephone Encounter (Signed)
Gave pt appt for lab,md and chemo for APril 2015

## 2014-04-02 NOTE — Progress Notes (Signed)
[facility] OFFICE PROGRESS NOTE  Patient Care Team: Precious Reel, MD as PCP - General Bernestine Amass, MD (Urology) Ladene Artist, MD (Gastroenterology) Burnell Blanks, MD (Cardiology) Heath Lark, MD as Consulting Physician (Hematology and Oncology)  DIAGNOSIS: AML, ongoing treatment  SUMMARY OF ONCOLOGIC HISTORY: This is a very pleasant gentleman with prior history of pancytopenia, was first seen in October 2013. Since then, he had multiple bone marrow aspirate and biopsy. His initial bone marrow biopsy in October 2012 showed 20% blast, suspicious for AML. Due to his age, he was offered Vidaza. In May of 2014, repeat bone marrow aspirate and biopsy show his blast count has reduced to about 9%. In November 2014, repeat bone marrow aspirate and biopsy confirmed complete remission. Between November 2014 to February 2015, he had multiple dosage adjustment to his treatment due to pancytopenia. On 02/23/2014, repeat bone marrow aspirate and biopsy showed progressive leukemia. The patient went to [facility] again for a second opinion and subsequently return here for palliative treatment. On 03/09/2014, we restarted him on Vidaza at 100 mg/m2  INTERVAL HISTORY: Kyle Brewer 80 y.o. male returns for further followup. He has significant bruising but denies any recent signs or symptoms of bleeding such as spontaneous epistaxis, hematuria or hematochezia. He denies any recent fever, chills, night sweats or abnormal weight loss His appetite is stable. He continued to have regular night sweats.  I have reviewed the past medical history, past surgical history, social history and family history with the patient and they are unchanged from previous note.  ALLERGIES:  has No Known Allergies.  MEDICATIONS:  Current Outpatient Prescriptions  Medication Sig Dispense Refill  . acyclovir (ZOVIRAX) 400 MG tablet Take 1 tablet (400 mg total) by mouth daily.  30 tablet  6  .  alendronate (FOSAMAX) 70 MG tablet Take 70 mg by mouth every Monday. Take with a full glass of water on an empty stomach.      Marland Kitchen amLODipine (NORVASC) 5 MG tablet Take 1 tablet (5 mg total) by mouth daily before breakfast.  90 tablet  3  . Calcium Carbonate-Vitamin D (CALCIUM 600 + D PO) Take 1 tablet by mouth 2 (two) times daily.      . finasteride (PROSCAR) 5 MG tablet Take 5 mg by mouth daily.      Marland Kitchen levothyroxine (SYNTHROID, LEVOTHROID) 50 MCG tablet Take 50 mcg by mouth every evening.       . lidocaine-prilocaine (EMLA) cream Apply 1 application topically as needed. Apply to [facility] a cath site as needle prior to needle stick.  30 g  2  . lisinopril (PRINIVIL,ZESTRIL) 10 MG tablet Take 10 mg by mouth every evening.       Marland Kitchen LORazepam (ATIVAN) 0.5 MG tablet Take 0.5 mg by mouth at bedtime as needed for anxiety (sleep).       . metoprolol succinate (TOPROL-XL) 100 MG 24 hr tablet Take 1 tablet (100 mg total) by mouth daily before breakfast. Take with or immediately following a meal.  90 tablet  3  . Multiple Vitamin (MULTIVITAMIN WITH MINERALS) TABS Take 1 tablet by mouth daily.      . ondansetron (ZOFRAN) 8 MG tablet Take 1 tablet (8 mg total) by mouth 2 (two) times daily as needed (Nausea or vomiting).  30 tablet  1  . simvastatin (ZOCOR) 20 MG tablet Take 20 mg by mouth at bedtime.        Marland Kitchen terazosin (HYTRIN) 10 MG  capsule Take 10 mg by mouth at bedtime.      . vitamin B-12 (CYANOCOBALAMIN) 1000 MCG tablet Take 1,000 mcg by mouth daily.      . vitamin C (ASCORBIC ACID) 500 MG tablet Take 500 mg by mouth daily.       No current facility-administered medications for this visit.    REVIEW OF SYSTEMS:   Constitutional: Denies fevers, chills  Eyes: Denies blurriness of vision Ears, nose, mouth, throat, and face: Denies mucositis or sore throat Respiratory: Denies cough, dyspnea or wheezes Cardiovascular: Denies palpitation, chest discomfort or lower extremity swelling Gastrointestinal:  Denies  nausea, heartburn or change in bowel habits Lymphatics: Denies new lymphadenopathy  Neurological:Denies numbness, tingling or new weaknesses Behavioral/Psych: Mood is stable, no new changes  All other systems were reviewed with the patient and are negative.  PHYSICAL EXAMINATION: ECOG PERFORMANCE STATUS: 1 - Symptomatic but completely ambulatory  Filed Vitals:   04/02/14 1211  BP: 128/65  Pulse: 68  Temp: 98 F (36.7 C)  Resp: 18   Filed Weights   04/02/14 1211  Weight: 185 lb 9.6 oz (84.188 kg)    GENERAL:alert, no distress and comfortable SKIN: Significant skin bruising but no petechiae EYES: normal, Conjunctiva are pale and non-injected, sclera clear OROPHARYNX:no exudate, no erythema and lips, buccal mucosa, and tongue normal  NECK: supple, thyroid normal size, non-tender, without nodularity LYMPH:  no palpable lymphadenopathy in the cervical, axillary or inguinal LUNGS: clear to auscultation and percussion with normal breathing effort HEART: regular rate & rhythm and no murmurs and no lower extremity edema ABDOMEN:abdomen soft, non-tender and normal bowel sounds Musculoskeletal:no cyanosis of digits and no clubbing  NEURO: alert & oriented x 3 with fluent speech, no focal motor/sensory deficits  LABORATORY DATA:  I have reviewed the data as listed    Component Value Date/Time   NA 141 03/16/2014 0945   NA 140 11/04/2012 1201   K 4.3 03/16/2014 0945   K 4.0 11/04/2012 1201   CL 111* 06/18/2013 0854   CL 107 11/04/2012 1201   CO2 24 03/16/2014 0945   CO2 26 11/04/2012 1201   GLUCOSE 84 03/16/2014 0945   GLUCOSE 117* 06/18/2013 0854   GLUCOSE 85 11/04/2012 1201   BUN 35.7* 03/16/2014 0945   BUN 26* 11/04/2012 1201   CREATININE 2.0* 03/16/2014 0945   CREATININE 1.56* 11/04/2012 1201   CALCIUM 9.5 03/16/2014 0945   CALCIUM 9.9 11/04/2012 1201   PROT 6.0* 03/16/2014 0945   ALBUMIN 3.4* 03/16/2014 0945   AST 11 03/16/2014 0945   ALT 14 03/16/2014 0945   ALKPHOS 56  03/16/2014 0945   BILITOT 0.37 03/16/2014 0945   GFRNONAA 39* 11/04/2012 1201   GFRAA 46* 11/04/2012 1201    No results found for this basename: SPEP,  UPEP,   kappa and lambda light chains    Lab Results  Component Value Date   WBC 1.3* 04/02/2014   NEUTROABS 0.1* 04/02/2014   HGB 8.6* 04/02/2014   HCT 24.9* 04/02/2014   MCV 91.9 04/02/2014   PLT 13* 04/02/2014      Chemistry      Component Value Date/Time   NA 141 03/16/2014 0945   NA 140 11/04/2012 1201   K 4.3 03/16/2014 0945   K 4.0 11/04/2012 1201   CL 111* 06/18/2013 0854   CL 107 11/04/2012 1201   CO2 24 03/16/2014 0945   CO2 26 11/04/2012 1201   BUN 35.7* 03/16/2014 0945   BUN 26* 11/04/2012 1201  CREATININE 2.0* 03/16/2014 0945   CREATININE 1.56* 11/04/2012 1201      Component Value Date/Time   CALCIUM 9.5 03/16/2014 0945   CALCIUM 9.9 11/04/2012 1201   ALKPHOS 56 03/16/2014 0945   AST 11 03/16/2014 0945   ALT 14 03/16/2014 0945   BILITOT 0.37 03/16/2014 0945     ASSESSMENT & PLAN:  #1 AML His prognosis is very poor. The patient has complex cytogenetics. The appointment his son as the medical power of attorney. The patient clarified that his status is DO NOT RESUSCITATE/DO NOT INTUBATE. I recommend we continue treatment for a minimum 2-3 cycles before repeat bone aspirate and biopsy to assess response to treatment. #2 Anemia #3 Severe Leukopenia #4 Severe Thrombocytopenia His abnormal blood count is related to underlying disease. If the patient's hemoglobin is less than 7 g, we will give him 2 units of irradiated dlood. If his platelet count is less than 10,000 or if he has any signs of active bleeding regardless of his platelet count, he will be transfused 1 unit of irradiated platelets. I recommend him to take acyclovir as antimicrobial prophylaxis. The patient will hold taking ciprofloxacin unless he had signs and symptoms of infection. The patient is recommended to continue holding off taking aspirin. He will continue  to have blood work checked every Mondays and Thursdays. I will see him before his next treatment. #5 chronic kidney disease His kidney function is stable. We will monitored carefully.   All questions were answered. The patient knows to call the clinic with any problems, questions or concerns. No barriers to learning was detected. I spent 25 minutes counseling the patient face to face. The total time spent in the appointment was 30 minutes and more than 50% was on counseling and review of test results     Heath Lark, MD 04/02/2014 2:12 PM

## 2014-04-06 ENCOUNTER — Ambulatory Visit (HOSPITAL_BASED_OUTPATIENT_CLINIC_OR_DEPARTMENT_OTHER): Payer: Medicare Other

## 2014-04-06 ENCOUNTER — Other Ambulatory Visit (HOSPITAL_BASED_OUTPATIENT_CLINIC_OR_DEPARTMENT_OTHER): Payer: Medicare Other

## 2014-04-06 ENCOUNTER — Other Ambulatory Visit: Payer: Self-pay | Admitting: Hematology and Oncology

## 2014-04-06 ENCOUNTER — Encounter: Payer: Self-pay | Admitting: *Deleted

## 2014-04-06 VITALS — BP 113/54 | HR 60 | Temp 97.6°F | Resp 18

## 2014-04-06 DIAGNOSIS — D696 Thrombocytopenia, unspecified: Secondary | ICD-10-CM

## 2014-04-06 DIAGNOSIS — D649 Anemia, unspecified: Secondary | ICD-10-CM | POA: Diagnosis not present

## 2014-04-06 DIAGNOSIS — C9200 Acute myeloblastic leukemia, not having achieved remission: Secondary | ICD-10-CM

## 2014-04-06 LAB — CBC WITH DIFFERENTIAL/PLATELET
BASO%: 0 % (ref 0.0–2.0)
Basophils Absolute: 0 10*3/uL (ref 0.0–0.1)
EOS%: 0 % (ref 0.0–7.0)
Eosinophils Absolute: 0 10*3/uL (ref 0.0–0.5)
HCT: 24 % — ABNORMAL LOW (ref 38.4–49.9)
HGB: 8.2 g/dL — ABNORMAL LOW (ref 13.0–17.1)
LYMPH#: 0.9 10*3/uL (ref 0.9–3.3)
LYMPH%: 88.1 % — ABNORMAL HIGH (ref 14.0–49.0)
MCH: 31.8 pg (ref 27.2–33.4)
MCHC: 34.2 g/dL (ref 32.0–36.0)
MCV: 93 fL (ref 79.3–98.0)
MONO#: 0.1 10*3/uL (ref 0.1–0.9)
MONO%: 5 % (ref 0.0–14.0)
NEUT#: 0.1 10*3/uL — CL (ref 1.5–6.5)
NEUT%: 6.9 % — ABNORMAL LOW (ref 39.0–75.0)
NRBC: 2 % — AB (ref 0–0)
Platelets: 10 10*3/uL — CL (ref 140–400)
RBC: 2.58 10*6/uL — ABNORMAL LOW (ref 4.20–5.82)
RDW: 18.9 % — AB (ref 11.0–14.6)
WBC: 1 10*3/uL — ABNORMAL LOW (ref 4.0–10.3)

## 2014-04-06 LAB — PREPARE PLATELET PHERESIS: UNIT DIVISION: 0

## 2014-04-06 LAB — HOLD TUBE, BLOOD BANK

## 2014-04-06 MED ORDER — HEPARIN SOD (PORK) LOCK FLUSH 100 UNIT/ML IV SOLN
500.0000 [IU] | Freq: Every day | INTRAVENOUS | Status: AC | PRN
  Administered 2014-04-06: 500 [IU]
  Filled 2014-04-06: qty 5

## 2014-04-06 MED ORDER — SODIUM CHLORIDE 0.9 % IV SOLN
250.0000 mL | Freq: Once | INTRAVENOUS | Status: AC
  Administered 2014-04-06: 250 mL via INTRAVENOUS

## 2014-04-06 MED ORDER — SODIUM CHLORIDE 0.9 % IJ SOLN
10.0000 mL | INTRAMUSCULAR | Status: AC | PRN
  Administered 2014-04-06: 10 mL
  Filled 2014-04-06: qty 10

## 2014-04-06 NOTE — Patient Instructions (Signed)
Platelet Transfusion Information This is information about transfusions of platelets. Platelets are tiny cells made by the bone marrow and found in the blood. When a blood vessel is damaged platelets rush to the damaged area to help form a clot. This begins the healing process. When platelets get very low your blood may have trouble clotting. This may be from:  Illness.  Blood disorder.  Chemotherapy to treat cancer. Often lower platelet counts do not usually cause problems.  Platelets usually last for 7 to 10 days. If they are not used not used in an injury, they are broken down by the liver or spleen. Symptoms of low platelet count include:  Nosebleeds.  Bleeding gums.  Heavy periods.  Bruising and tiny blood spots in the skin.  Pin point spots of bleeding are called (petechiae).  Larger bruises (purpura).  Bleeding can be more serious if it happens in the brain or bowel. Platelet transfusions are often used to keep the platelet count at an acceptable level. Serious bleeding due to low platelets is uncommon. RISKS AND COMPLICATIONS Severe side effects from platelet transfusions are uncommon. Minor reactions may include:  Itching.  Rashes.  High temperature and shivering. Medications are available to stop transfusion reactions. Let your caregivers know if you develop any of the above problems.  If you are having platelet transfusions frequently they may get less effective. This is called becoming refractory to platelets. It is uncommon. This can happen from non-immune causes and immune causes. Non-immune causes include:  High temperatures.  Some medications.  An enlarged spleen. Immune causes happen when your body discovers the platelets are not your own and begin making antibodies against them. The antibodies kill the platelets quickly. Even with platelet transfusions you may still notice problems with bleeding or bruising. Let your caregivers know about this. Other things  can be done to help if this happens.  BEFORE THE PROCEDURE   Your doctors will check your platelet count regularly.  If the platelet count is too low it may be necessary to have a platelet transfusion.  This is more important before certain procedures with a risk of bleeding such as a spinal tap.  Platelet transfusion reduces the risk of bleeding during or after the procedure.  Except in emergencies, giving a transfusion requires a written consent. Before blood is taken from a donor, a complete history is taken to make sure the person has no history of previous diseases, nor engages in risky social behavior. Examples of this are intravenous drug use or sexual activity with multiple partners. This could lead to infected blood or blood products being used. This history is done even in spite of the extensive testing to make sure the blood is safe. All blood products transfused are tested to make sure it is a match for the person getting the blood. It is also checked for infections. Blood is the safest it has ever been. The risk of getting an infection is very low. PROCEDURE  The platelets are stored in small plastic bags which are kept at a low temperature.  Each bag is called a unit and sometimes two units are given. They are given through an intravenous line by drip infusion over about one half hour.  Usually blood is collected from multiple people to get enough to transfuse.  Sometimes, the platelets are collected from a single person. This is done using a special machine that separates the platelets from the blood. The machine is called an apheresis machine. Platelets collected   in this way are called apheresed platelets. Apheresed platelets reduce the risk of becoming sensitive to the platelets. This lowers the chances of having a transfusion reaction.  As it only takes a short time to give the platelets, this treatment can be given in an outpatients department. Platelets can also be given  before or after other treatments. SEEK IMMEDIATE MEDICAL CARE IF: Any of the following symptoms over the next 12 hours or several days:  Shaking chills.  Fever with a temperature greater than 102 F (38.9 C) develops.  Back pain or muscle pain.  People around you feel you are not acting correctly, or you are confused.  Blood in the urine or bowel movements or bleeding from any place in your body.  Shortness of breath, or difficulty breathing.  Dizziness.  Fainting.  You break out in a rash or develop hives.  You have a decrease in the amount of urine you are putting out, or the urine turns a dark color or changes to pink, red, or [place].  A severe headache or stiff neck.  Bruising more easily. Document Released: 10/08/2007 Document Revised: 03/04/2012 Document Reviewed: 10/08/2007 [employer] Patient Information 2014 [employer].  

## 2014-04-07 LAB — PREPARE PLATELET PHERESIS: UNIT DIVISION: 0

## 2014-04-09 ENCOUNTER — Telehealth: Payer: Self-pay | Admitting: *Deleted

## 2014-04-09 ENCOUNTER — Other Ambulatory Visit (HOSPITAL_BASED_OUTPATIENT_CLINIC_OR_DEPARTMENT_OTHER): Payer: Medicare Other

## 2014-04-09 ENCOUNTER — Other Ambulatory Visit: Payer: Self-pay | Admitting: Hematology and Oncology

## 2014-04-09 ENCOUNTER — Non-Acute Institutional Stay (HOSPITAL_COMMUNITY)
Admission: AD | Admit: 2014-04-09 | Discharge: 2014-04-09 | Disposition: A | Discharge status: Home / Self Care | Payer: Medicare Other | Source: Ambulatory Visit | Attending: Hematology and Oncology | Admitting: Hematology and Oncology

## 2014-04-09 ENCOUNTER — Other Ambulatory Visit: Payer: Self-pay | Admitting: *Deleted

## 2014-04-09 DIAGNOSIS — C9200 Acute myeloblastic leukemia, not having achieved remission: Secondary | ICD-10-CM | POA: Insufficient documentation

## 2014-04-09 DIAGNOSIS — D72819 Decreased white blood cell count, unspecified: Secondary | ICD-10-CM

## 2014-04-09 DIAGNOSIS — D696 Thrombocytopenia, unspecified: Secondary | ICD-10-CM

## 2014-04-09 DIAGNOSIS — D649 Anemia, unspecified: Secondary | ICD-10-CM

## 2014-04-09 LAB — CBC WITH DIFFERENTIAL/PLATELET
BASO%: 0 % (ref 0.0–2.0)
BASOS ABS: 0 10*3/uL (ref 0.0–0.1)
EOS ABS: 0 10*3/uL (ref 0.0–0.5)
EOS%: 1 % (ref 0.0–7.0)
HCT: 23.3 % — ABNORMAL LOW (ref 38.4–49.9)
HEMOGLOBIN: 7.9 g/dL — AB (ref 13.0–17.1)
LYMPH#: 1 10*3/uL (ref 0.9–3.3)
LYMPH%: 91.3 % — AB (ref 14.0–49.0)
MCH: 31.5 pg (ref 27.2–33.4)
MCHC: 33.9 g/dL (ref 32.0–36.0)
MCV: 92.8 fL (ref 79.3–98.0)
MONO#: 0 10*3/uL — AB (ref 0.1–0.9)
MONO%: 1.9 % (ref 0.0–14.0)
NEUT%: 5.8 % — AB (ref 39.0–75.0)
NEUTROS ABS: 0.1 10*3/uL — AB (ref 1.5–6.5)
PLATELETS: 12 10*3/uL — AB (ref 140–400)
RBC: 2.51 10*6/uL — ABNORMAL LOW (ref 4.20–5.82)
RDW: 19.1 % — ABNORMAL HIGH (ref 11.0–14.6)
WBC: 1 10*3/uL — AB (ref 4.0–10.3)
nRBC: 0 % (ref 0–0)

## 2014-04-09 LAB — HOLD TUBE, BLOOD BANK

## 2014-04-09 LAB — PREPARE RBC (CROSSMATCH)

## 2014-04-09 LAB — TECHNOLOGIST REVIEW

## 2014-04-09 MED ORDER — DIPHENHYDRAMINE HCL 50 MG/ML IJ SOLN
12.5000 mg | Freq: Once | INTRAMUSCULAR | Status: AC
  Administered 2014-04-09: 12.5 mg via INTRAVENOUS
  Filled 2014-04-09: qty 1

## 2014-04-09 MED ORDER — HEPARIN SOD (PORK) LOCK FLUSH 100 UNIT/ML IV SOLN: 250.0000 [IU] | INTRAVENOUS | Status: DC | PRN

## 2014-04-09 MED ORDER — SODIUM CHLORIDE 0.9 % IJ SOLN
10.0000 mL | INTRAMUSCULAR | Status: AC | PRN
  Administered 2014-04-09: 10 mL

## 2014-04-09 MED ORDER — SODIUM CHLORIDE 0.9 % IV SOLN
250.0000 mL | Freq: Once | INTRAVENOUS | Status: AC
  Administered 2014-04-09: 250 mL via INTRAVENOUS

## 2014-04-09 MED ORDER — SODIUM CHLORIDE 0.9 % IJ SOLN: 3.0000 mL | INTRAMUSCULAR | Status: DC | PRN

## 2014-04-09 MED ORDER — ACETAMINOPHEN 325 MG PO TABS
650.0000 mg | ORAL_TABLET | Freq: Once | ORAL | Status: AC
  Administered 2014-04-09: 650 mg via ORAL
  Filled 2014-04-09: qty 2

## 2014-04-09 MED ORDER — HEPARIN SOD (PORK) LOCK FLUSH 100 UNIT/ML IV SOLN
500.0000 [IU] | Freq: Every day | INTRAVENOUS | Status: AC | PRN
  Administered 2014-04-09: 500 [IU]
  Filled 2014-04-09: qty 5

## 2014-04-09 NOTE — H&P (Signed)
The patient is here for blood transfusion only. 

## 2014-04-09 NOTE — Telephone Encounter (Signed)
S/w pt in lobby.  Informed of order for 2 units of blood ordered for transfusion today by Dr. Alvy Bimler.  No room at [facility], but [facility] can take pt today at 1 pm.  Written directions given to pt to go to [facility] 1 pm today for transfusion.  He verbalized understanding.  Pt reports he had some chills w/ last transfusion.  Dr. Alvy Bimler notified and tylenol and benadryl pre meds added.  Notified nurse in [facility] of pre meds ordered.

## 2014-04-09 NOTE — Procedures (Signed)
[facility]  Procedure Note  YOSSI HINCHMAN UXY:[id number] DOB: February 08, 1927 DOA: 04/09/2014   PCP: Dr. Alvy Bimler  Associated Diagnosis: Acute myelogenous leukemia  Procedure Note: port accessed, 2 units of PRBC's transfused, port de-accessed and flushed per protocol   Condition During Procedure: denied any complaint throughout transfusion   Condition at Discharge: Stable, patient ambulatory out of clinic   Roberto Scales, [facility]

## 2014-04-10 LAB — TYPE AND SCREEN
ABO/RH(D): O POS
ANTIBODY SCREEN: NEGATIVE
UNIT DIVISION: 0
Unit division: 0

## 2014-04-13 ENCOUNTER — Encounter: Payer: Self-pay | Admitting: *Deleted

## 2014-04-13 ENCOUNTER — Telehealth: Payer: Self-pay | Admitting: *Deleted

## 2014-04-13 ENCOUNTER — Other Ambulatory Visit: Payer: Self-pay | Admitting: Hematology and Oncology

## 2014-04-13 ENCOUNTER — Telehealth: Payer: Self-pay | Admitting: Hematology and Oncology

## 2014-04-13 ENCOUNTER — Other Ambulatory Visit (HOSPITAL_BASED_OUTPATIENT_CLINIC_OR_DEPARTMENT_OTHER): Payer: Medicare Other

## 2014-04-13 ENCOUNTER — Ambulatory Visit (HOSPITAL_BASED_OUTPATIENT_CLINIC_OR_DEPARTMENT_OTHER): Payer: Medicare Other

## 2014-04-13 ENCOUNTER — Ambulatory Visit (HOSPITAL_BASED_OUTPATIENT_CLINIC_OR_DEPARTMENT_OTHER): Payer: Medicare Other | Admitting: Hematology and Oncology

## 2014-04-13 ENCOUNTER — Encounter: Payer: Self-pay | Admitting: Hematology and Oncology

## 2014-04-13 VITALS — BP 137/65 | HR 68 | Temp 97.9°F | Resp 18 | Ht 68.0 in | Wt 183.9 lb

## 2014-04-13 VITALS — BP 101/59 | HR 64 | Temp 98.4°F | Resp 18

## 2014-04-13 DIAGNOSIS — D649 Anemia, unspecified: Secondary | ICD-10-CM

## 2014-04-13 DIAGNOSIS — D696 Thrombocytopenia, unspecified: Secondary | ICD-10-CM

## 2014-04-13 DIAGNOSIS — Z5111 Encounter for antineoplastic chemotherapy: Secondary | ICD-10-CM

## 2014-04-13 DIAGNOSIS — D72819 Decreased white blood cell count, unspecified: Secondary | ICD-10-CM

## 2014-04-13 DIAGNOSIS — N189 Chronic kidney disease, unspecified: Secondary | ICD-10-CM

## 2014-04-13 DIAGNOSIS — C9200 Acute myeloblastic leukemia, not having achieved remission: Secondary | ICD-10-CM

## 2014-04-13 LAB — COMPREHENSIVE METABOLIC PANEL (CC13)
ALT: 14 U/L (ref 0–55)
AST: 17 U/L (ref 5–34)
Albumin: 3.5 g/dL (ref 3.5–5.0)
Alkaline Phosphatase: 57 U/L (ref 40–150)
Anion Gap: 6 mEq/L (ref 3–11)
BILIRUBIN TOTAL: 0.67 mg/dL (ref 0.20–1.20)
BUN: 35.9 mg/dL — ABNORMAL HIGH (ref 7.0–26.0)
CO2: 25 meq/L (ref 22–29)
CREATININE: 1.7 mg/dL — AB (ref 0.7–1.3)
Calcium: 9.9 mg/dL (ref 8.4–10.4)
Chloride: 113 mEq/L — ABNORMAL HIGH (ref 98–109)
GLUCOSE: 77 mg/dL (ref 70–140)
Potassium: 4.2 mEq/L (ref 3.5–5.1)
Sodium: 145 mEq/L (ref 136–145)
Total Protein: 6.4 g/dL (ref 6.4–8.3)

## 2014-04-13 LAB — CBC WITH DIFFERENTIAL/PLATELET
BASO%: 0 % (ref 0.0–2.0)
BASOS ABS: 0 10*3/uL (ref 0.0–0.1)
EOS ABS: 0 10*3/uL (ref 0.0–0.5)
EOS%: 0 % (ref 0.0–7.0)
HCT: 29.7 % — ABNORMAL LOW (ref 38.4–49.9)
HGB: 10 g/dL — ABNORMAL LOW (ref 13.0–17.1)
LYMPH%: 92 % — AB (ref 14.0–49.0)
MCH: 30.8 pg (ref 27.2–33.4)
MCHC: 33.7 g/dL (ref 32.0–36.0)
MCV: 91.4 fL (ref 79.3–98.0)
MONO#: 0 10*3/uL — ABNORMAL LOW (ref 0.1–0.9)
MONO%: 0.7 % (ref 0.0–14.0)
NEUT%: 7.3 % — ABNORMAL LOW (ref 39.0–75.0)
NEUTROS ABS: 0.1 10*3/uL — AB (ref 1.5–6.5)
NRBC: 0 % (ref 0–0)
PLATELETS: 9 10*3/uL — AB (ref 140–400)
RBC: 3.25 10*6/uL — AB (ref 4.20–5.82)
RDW: 19 % — ABNORMAL HIGH (ref 11.0–14.6)
WBC: 1.4 10*3/uL — ABNORMAL LOW (ref 4.0–10.3)
lymph#: 1.3 10*3/uL (ref 0.9–3.3)

## 2014-04-13 MED ORDER — SODIUM CHLORIDE 0.9 % IJ SOLN
10.0000 mL | INTRAMUSCULAR | Status: DC | PRN
  Administered 2014-04-13: 10 mL
  Filled 2014-04-13: qty 10

## 2014-04-13 MED ORDER — ACETAMINOPHEN 325 MG PO TABS
ORAL_TABLET | ORAL | Status: AC
  Filled 2014-04-13: qty 2

## 2014-04-13 MED ORDER — SODIUM CHLORIDE 0.9 % IV SOLN
Freq: Once | INTRAVENOUS | Status: AC
  Administered 2014-04-13: 11:00:00 via INTRAVENOUS

## 2014-04-13 MED ORDER — ONDANSETRON HCL 8 MG PO TABS
8.0000 mg | ORAL_TABLET | Freq: Once | ORAL | Status: AC
  Administered 2014-04-13: 8 mg via ORAL

## 2014-04-13 MED ORDER — ONDANSETRON 8 MG/50ML IVPB (CHCC): 8.0000 mg | Freq: Once | INTRAVENOUS | Status: DC

## 2014-04-13 MED ORDER — DIPHENHYDRAMINE HCL 25 MG PO CAPS
25.0000 mg | ORAL_CAPSULE | Freq: Once | ORAL | Status: AC
  Administered 2014-04-13: 25 mg via ORAL

## 2014-04-13 MED ORDER — HEPARIN SOD (PORK) LOCK FLUSH 100 UNIT/ML IV SOLN
500.0000 [IU] | Freq: Once | INTRAVENOUS | Status: AC | PRN
  Administered 2014-04-13: 500 [IU]
  Filled 2014-04-13: qty 5

## 2014-04-13 MED ORDER — DIPHENHYDRAMINE HCL 25 MG PO CAPS
ORAL_CAPSULE | ORAL | Status: AC
  Filled 2014-04-13: qty 1

## 2014-04-13 MED ORDER — ONDANSETRON HCL 8 MG PO TABS
ORAL_TABLET | ORAL | Status: AC
  Filled 2014-04-13: qty 1

## 2014-04-13 MED ORDER — ACETAMINOPHEN 325 MG PO TABS
650.0000 mg | ORAL_TABLET | Freq: Once | ORAL | Status: AC
  Administered 2014-04-13: 650 mg via ORAL

## 2014-04-13 MED ORDER — LACTATED RINGERS IV SOLN
100.0000 mg/m2 | Freq: Every day | INTRAVENOUS | Status: DC
  Administered 2014-04-13: 200 mg via INTRAVENOUS
  Filled 2014-04-13: qty 40

## 2014-04-13 MED ORDER — SODIUM CHLORIDE 0.9 % IV SOLN: 250.0000 mL | Freq: Once | INTRAVENOUS | Status: DC

## 2014-04-13 NOTE — Telephone Encounter (Signed)
Per staff phone call I have scheduled appt

## 2014-04-13 NOTE — Telephone Encounter (Signed)
Per staff message and POF I have scheduled appts.  JMW  

## 2014-04-13 NOTE — Progress Notes (Signed)
BMBX moved to 5/21 from 5/20 per Dr. Alvy Bimler.  Notified Carter in [employer].

## 2014-04-13 NOTE — Progress Notes (Signed)
[place] OFFICE PROGRESS NOTE  Patient Care Team: Precious Reel, MD as PCP - General Bernestine Amass, MD (Urology) Ladene Artist, MD (Gastroenterology) Burnell Blanks, MD (Cardiology) Heath Lark, MD as Consulting Physician (Hematology and Oncology)  DIAGNOSIS: AML, ongoing treatment  SUMMARY OF ONCOLOGIC HISTORY: This is a very pleasant gentleman with prior history of pancytopenia, was first seen in October 2013. Since then, he had multiple bone marrow aspirate and biopsy. His initial bone marrow biopsy in October 2012 showed 20% blast, suspicious for AML. Due to his age, he was offered Vidaza. In May of 2014, repeat bone marrow aspirate and biopsy show his blast count has reduced to about 9%. In November 2014, repeat bone marrow aspirate and biopsy confirmed complete remission. Between November 2014 to February 2015, he had multiple dosage adjustment to his treatment due to pancytopenia. On 02/23/2014, repeat bone marrow aspirate and biopsy showed progressive leukemia. The patient went to [facility] again for a second opinion and subsequently return here for palliative treatment. On 03/09/2014, we restarted him on Vidaza at 100 mg/m2, 7 days on, out of cycle of 35 days On 04/13/2014, his treatment is switched to intravenous form INTERVAL HISTORY: Kyle Brewer 80 y.o. male returns for further followup. He bruises easily. The patient denies any recent signs or symptoms of bleeding such as spontaneous epistaxis, hematuria or hematochezia. He denies any recent fever, chills, night sweats or abnormal weight loss He denies any cough.  I have reviewed the past medical history, past surgical history, social history and family history with the patient and they are unchanged from previous note.  ALLERGIES:  has No Known Allergies.  MEDICATIONS:  Current Outpatient Prescriptions  Medication Sig Dispense Refill  . acyclovir (ZOVIRAX) 400 MG tablet Take 1 tablet (400 mg  total) by mouth daily.  30 tablet  6  . alendronate (FOSAMAX) 70 MG tablet Take 70 mg by mouth every Monday. Take with a full glass of water on an empty stomach.      Marland Kitchen amLODipine (NORVASC) 5 MG tablet Take 1 tablet (5 mg total) by mouth daily before breakfast.  90 tablet  3  . Calcium Carbonate-Vitamin D (CALCIUM 600 + D PO) Take 1 tablet by mouth 2 (two) times daily.      . finasteride (PROSCAR) 5 MG tablet Take 5 mg by mouth daily.      Marland Kitchen levothyroxine (SYNTHROID, LEVOTHROID) 50 MCG tablet Take 50 mcg by mouth every evening.       . lidocaine-prilocaine (EMLA) cream Apply 1 application topically as needed. Apply to [facility] a cath site as needle prior to needle stick.  30 g  2  . lisinopril (PRINIVIL,ZESTRIL) 10 MG tablet Take 10 mg by mouth every evening.       Marland Kitchen LORazepam (ATIVAN) 0.5 MG tablet Take 0.5 mg by mouth at bedtime as needed for anxiety (sleep).       . metoprolol succinate (TOPROL-XL) 100 MG 24 hr tablet Take 1 tablet (100 mg total) by mouth daily before breakfast. Take with or immediately following a meal.  90 tablet  3  . Multiple Vitamin (MULTIVITAMIN WITH MINERALS) TABS Take 1 tablet by mouth daily.      . simvastatin (ZOCOR) 20 MG tablet Take 20 mg by mouth at bedtime.        Marland Kitchen terazosin (HYTRIN) 10 MG capsule Take 10 mg by mouth at bedtime.      . vitamin B-12 (CYANOCOBALAMIN) 1000 MCG  tablet Take 1,000 mcg by mouth daily.      . vitamin C (ASCORBIC ACID) 500 MG tablet Take 500 mg by mouth daily.       No current facility-administered medications for this visit.   Facility-Administered Medications Ordered in Other Visits  Medication Dose Route Frequency Provider Last Rate Last Dose  . 0.9 %  sodium chloride infusion  250 mL Intravenous Once Heath Lark, MD      . azaCITIDine (VIDAZA) 200 mg in lactated ringers 60 mL chemo infusion  100 mg/m2 (Treatment Plan Actual) Intravenous Daily Heath Lark, MD   200 mg at 04/13/14 1339  . ondansetron (ZOFRAN) IVPB 8 mg  8 mg Intravenous  Once Heath Lark, MD      . sodium chloride 0.9 % injection 10 mL  10 mL Intracatheter PRN Heath Lark, MD   10 mL at 04/13/14 1435    REVIEW OF SYSTEMS:   Constitutional: Denies fevers, chills or abnormal weight loss Eyes: Denies blurriness of vision Ears, nose, mouth, throat, and face: Denies mucositis or sore throat Respiratory: Denies cough, dyspnea or wheezes Cardiovascular: Denies palpitation, chest discomfort or lower extremity swelling Gastrointestinal:  Denies nausea, heartburn or change in bowel habits Lymphatics: Denies new lymphadenopathy or easy bruising Neurological:Denies numbness, tingling or new weaknesses Behavioral/Psych: Mood is stable, no new changes  All other systems were reviewed with the patient and are negative.  PHYSICAL EXAMINATION: ECOG PERFORMANCE STATUS: 1 - Symptomatic but completely ambulatory  Filed Vitals:   04/13/14 1046  BP: 137/65  Pulse: 68  Temp: 97.9 F (36.6 C)  Resp: 18   Filed Weights   04/13/14 1046  Weight: 183 lb 14.4 oz (83.416 kg)    GENERAL:alert, no distress and comfortable SKIN:. Mild bruises but no petechiae rash EYES: normal, Conjunctiva are pink and non-injected, sclera clear OROPHARYNX:no exudate, no erythema and lips, buccal mucosa, and tongue normal  NECK: supple, thyroid normal size, non-tender, without nodularity LYMPH:  no palpable lymphadenopathy in the cervical, axillary or inguinal LUNGS: clear to auscultation and percussion with normal breathing effort HEART: regular rate & rhythm and no murmurs and no lower extremity edema ABDOMEN:abdomen soft, non-tender and normal bowel sounds Musculoskeletal:no cyanosis of digits and no clubbing  NEURO: alert & oriented x 3 with fluent speech, no focal motor/sensory deficits  LABORATORY DATA:  I have reviewed the data as listed    Component Value Date/Time   NA 145 04/13/2014 1034   NA 140 11/04/2012 1201   K 4.2 04/13/2014 1034   K 4.0 11/04/2012 1201   CL 111*  06/18/2013 0854   CL 107 11/04/2012 1201   CO2 25 04/13/2014 1034   CO2 26 11/04/2012 1201   GLUCOSE 77 04/13/2014 1034   GLUCOSE 117* 06/18/2013 0854   GLUCOSE 85 11/04/2012 1201   BUN 35.9* 04/13/2014 1034   BUN 26* 11/04/2012 1201   CREATININE 1.7* 04/13/2014 1034   CREATININE 1.56* 11/04/2012 1201   CALCIUM 9.9 04/13/2014 1034   CALCIUM 9.9 11/04/2012 1201   PROT 6.4 04/13/2014 1034   ALBUMIN 3.5 04/13/2014 1034   AST 17 04/13/2014 1034   ALT 14 04/13/2014 1034   ALKPHOS 57 04/13/2014 1034   BILITOT 0.67 04/13/2014 1034   GFRNONAA 39* 11/04/2012 1201   GFRAA 46* 11/04/2012 1201    No results found for this basename: SPEP,  UPEP,   kappa and lambda light chains    Lab Results  Component Value Date   WBC 1.4* 04/13/2014  NEUTROABS 0.1* 04/13/2014   HGB 10.0* 04/13/2014   HCT 29.7* 04/13/2014   MCV 91.4 04/13/2014   PLT 9* 04/13/2014      Chemistry      Component Value Date/Time   NA 145 04/13/2014 1034   NA 140 11/04/2012 1201   K 4.2 04/13/2014 1034   K 4.0 11/04/2012 1201   CL 111* 06/18/2013 0854   CL 107 11/04/2012 1201   CO2 25 04/13/2014 1034   CO2 26 11/04/2012 1201   BUN 35.9* 04/13/2014 1034   BUN 26* 11/04/2012 1201   CREATININE 1.7* 04/13/2014 1034   CREATININE 1.56* 11/04/2012 1201      Component Value Date/Time   CALCIUM 9.9 04/13/2014 1034   CALCIUM 9.9 11/04/2012 1201   ALKPHOS 57 04/13/2014 1034   AST 17 04/13/2014 1034   ALT 14 04/13/2014 1034   BILITOT 0.67 04/13/2014 1034     ASSESSMENT & PLAN:  #1 AML His prognosis is very poor. The patient has complex cytogenetics. The appointment his son as the medical power of attorney. The patient clarified that his status is DO NOT RESUSCITATE/DO NOT INTUBATE. I recommend we continue treatment for a minimum 2 cycles. Will plan to repeat bone aspirate and biopsy to assess response to treatment next month. Due to extensive bruising, I will switch his Treatment to be given intravenous. #2 Anemia #3 Severe Leukopenia #4  Severe Thrombocytopenia His abnormal blood count is related to underlying disease. If the patient's hemoglobin is less than 7 g, we will give him 2 units of irradiated dlood. If his platelet count is less than 10,000 or if he has any signs of active bleeding regardless of his platelet count, he will be transfused 1 unit of irradiated platelets. I recommend him to take acyclovir as antimicrobial prophylaxis. The patient will hold taking ciprofloxacin unless he had signs and symptoms of infection. The patient is recommended to continue holding off taking aspirin. He will continue to have blood work checked every Mondays and Thursdays. He will receive one unit of platelet transfusion today. #5 chronic kidney disease His kidney function is stable. We will monitored carefully.   All questions were answered. The patient knows to call the clinic with any problems, questions or concerns. No barriers to learning was detected. I spent 40 minutes counseling the patient face to face. The total time spent in the appointment was 55 minutes and more than 50% was on counseling and review of test results     Heath Lark, MD 04/13/2014 3:19 PM

## 2014-04-13 NOTE — Telephone Encounter (Signed)
Gave pt appt for lab,md and chemo for April 2015 e,emailed Michelle regaridng chemo

## 2014-04-13 NOTE — Progress Notes (Signed)
Pt scheduled for BMBx at [facility] 05/13/14 at 8 am.  Butch Penny in [place] notified.

## 2014-04-14 ENCOUNTER — Ambulatory Visit (HOSPITAL_BASED_OUTPATIENT_CLINIC_OR_DEPARTMENT_OTHER): Payer: Medicare Other

## 2014-04-14 VITALS — BP 103/57 | HR 71 | Temp 98.6°F | Resp 19 | Ht 68.0 in

## 2014-04-14 DIAGNOSIS — C9200 Acute myeloblastic leukemia, not having achieved remission: Secondary | ICD-10-CM

## 2014-04-14 DIAGNOSIS — Z5111 Encounter for antineoplastic chemotherapy: Secondary | ICD-10-CM

## 2014-04-14 LAB — PREPARE PLATELET PHERESIS: UNIT DIVISION: 0

## 2014-04-14 MED ORDER — SODIUM CHLORIDE 0.9 % IJ SOLN
10.0000 mL | INTRAMUSCULAR | Status: DC | PRN
  Administered 2014-04-14: 10 mL
  Filled 2014-04-14: qty 10

## 2014-04-14 MED ORDER — HEPARIN SOD (PORK) LOCK FLUSH 100 UNIT/ML IV SOLN
500.0000 [IU] | Freq: Once | INTRAVENOUS | Status: AC | PRN
  Administered 2014-04-14: 500 [IU]
  Filled 2014-04-14: qty 5

## 2014-04-14 MED ORDER — SODIUM CHLORIDE 0.9 % IV SOLN
Freq: Once | INTRAVENOUS | Status: AC
  Administered 2014-04-14: 10:00:00 via INTRAVENOUS

## 2014-04-14 MED ORDER — LACTATED RINGERS IV SOLN
100.0000 mg/m2 | Freq: Every day | INTRAVENOUS | Status: DC
  Administered 2014-04-14: 200 mg via INTRAVENOUS
  Filled 2014-04-14: qty 40

## 2014-04-14 MED ORDER — ONDANSETRON 8 MG/NS 50 ML IVPB
INTRAVENOUS | Status: AC
  Filled 2014-04-14: qty 8

## 2014-04-14 MED ORDER — ONDANSETRON 8 MG/50ML IVPB (CHCC)
8.0000 mg | Freq: Once | INTRAVENOUS | Status: AC
  Administered 2014-04-14: 8 mg via INTRAVENOUS

## 2014-04-14 NOTE — Patient Instructions (Signed)
[facility] Discharge Instructions for Patients Receiving Chemotherapy  Today you received the following chemotherapy agents Vidaza. To help prevent nausea and vomiting after your treatment, we encourage you to take your nausea medication as prescribed.    If you develop nausea and vomiting that is not controlled by your nausea medication, call the clinic.   BELOW ARE SYMPTOMS THAT SHOULD BE REPORTED IMMEDIATELY:  *FEVER GREATER THAN 100.5 F  *CHILLS WITH OR WITHOUT FEVER  NAUSEA AND VOMITING THAT IS NOT CONTROLLED WITH YOUR NAUSEA MEDICATION  *UNUSUAL SHORTNESS OF BREATH  *UNUSUAL BRUISING OR BLEEDING  TENDERNESS IN MOUTH AND THROAT WITH OR WITHOUT PRESENCE OF ULCERS  *URINARY PROBLEMS  *BOWEL PROBLEMS  UNUSUAL RASH Items with * indicate a potential emergency and should be followed up as soon as possible.  Feel free to call the clinic you have any questions or concerns. The clinic phone number is [phone].    

## 2014-04-15 ENCOUNTER — Ambulatory Visit (HOSPITAL_BASED_OUTPATIENT_CLINIC_OR_DEPARTMENT_OTHER): Payer: Medicare Other

## 2014-04-15 ENCOUNTER — Telehealth: Payer: Self-pay | Admitting: Hematology and Oncology

## 2014-04-15 VITALS — BP 108/51 | HR 76 | Temp 97.9°F | Resp 17

## 2014-04-15 DIAGNOSIS — C9200 Acute myeloblastic leukemia, not having achieved remission: Secondary | ICD-10-CM

## 2014-04-15 DIAGNOSIS — Z5111 Encounter for antineoplastic chemotherapy: Secondary | ICD-10-CM

## 2014-04-15 MED ORDER — HEPARIN SOD (PORK) LOCK FLUSH 100 UNIT/ML IV SOLN
500.0000 [IU] | Freq: Once | INTRAVENOUS | Status: AC | PRN
  Administered 2014-04-15: 500 [IU]
  Filled 2014-04-15: qty 5

## 2014-04-15 MED ORDER — ONDANSETRON 8 MG/NS 50 ML IVPB
INTRAVENOUS | Status: AC
  Filled 2014-04-15: qty 8

## 2014-04-15 MED ORDER — SODIUM CHLORIDE 0.9 % IV SOLN
Freq: Once | INTRAVENOUS | Status: AC
  Administered 2014-04-15: 10:00:00 via INTRAVENOUS

## 2014-04-15 MED ORDER — SODIUM CHLORIDE 0.9 % IJ SOLN
10.0000 mL | INTRAMUSCULAR | Status: DC | PRN
  Administered 2014-04-15: 10 mL
  Filled 2014-04-15: qty 10

## 2014-04-15 MED ORDER — LACTATED RINGERS IV SOLN
100.0000 mg/m2 | Freq: Every day | INTRAVENOUS | Status: DC
  Administered 2014-04-15: 200 mg via INTRAVENOUS
  Filled 2014-04-15: qty 40

## 2014-04-15 MED ORDER — ONDANSETRON 8 MG/50ML IVPB (CHCC)
8.0000 mg | Freq: Once | INTRAVENOUS | Status: AC
  Administered 2014-04-15: 8 mg via INTRAVENOUS

## 2014-04-15 NOTE — Patient Instructions (Signed)
[facility] Discharge Instructions for Patients Receiving Chemotherapy  Today you received the following chemotherapy agents Vidaza. To help prevent nausea and vomiting after your treatment, we encourage you to take your nausea medication as prescribed.    If you develop nausea and vomiting that is not controlled by your nausea medication, call the clinic.   BELOW ARE SYMPTOMS THAT SHOULD BE REPORTED IMMEDIATELY:  *FEVER GREATER THAN 100.5 F  *CHILLS WITH OR WITHOUT FEVER  NAUSEA AND VOMITING THAT IS NOT CONTROLLED WITH YOUR NAUSEA MEDICATION  *UNUSUAL SHORTNESS OF BREATH  *UNUSUAL BRUISING OR BLEEDING  TENDERNESS IN MOUTH AND THROAT WITH OR WITHOUT PRESENCE OF ULCERS  *URINARY PROBLEMS  *BOWEL PROBLEMS  UNUSUAL RASH Items with * indicate a potential emergency and should be followed up as soon as possible.  Feel free to call the clinic you have any questions or concerns. The clinic phone number is [phone].    

## 2014-04-15 NOTE — Telephone Encounter (Signed)
Talked to pt and he has appt calendar for APril and May 2015

## 2014-04-16 ENCOUNTER — Ambulatory Visit: Payer: Medicare Other

## 2014-04-16 ENCOUNTER — Ambulatory Visit (HOSPITAL_BASED_OUTPATIENT_CLINIC_OR_DEPARTMENT_OTHER): Payer: Medicare Other | Admitting: Hematology and Oncology

## 2014-04-16 ENCOUNTER — Ambulatory Visit: Payer: Medicare Other | Admitting: Cardiovascular Disease

## 2014-04-16 ENCOUNTER — Other Ambulatory Visit: Payer: Self-pay | Admitting: Hematology and Oncology

## 2014-04-16 VITALS — BP 119/58 | HR 64 | Temp 97.8°F | Resp 17

## 2014-04-16 DIAGNOSIS — C9200 Acute myeloblastic leukemia, not having achieved remission: Secondary | ICD-10-CM

## 2014-04-16 DIAGNOSIS — Z5111 Encounter for antineoplastic chemotherapy: Secondary | ICD-10-CM

## 2014-04-16 DIAGNOSIS — D696 Thrombocytopenia, unspecified: Secondary | ICD-10-CM

## 2014-04-16 LAB — CBC WITH DIFFERENTIAL/PLATELET
BASO%: 0 % (ref 0.0–2.0)
Basophils Absolute: 0 10*3/uL (ref 0.0–0.1)
EOS ABS: 0 10*3/uL (ref 0.0–0.5)
EOS%: 0 % (ref 0.0–7.0)
HCT: 27.8 % — ABNORMAL LOW (ref 38.4–49.9)
HGB: 9.1 g/dL — ABNORMAL LOW (ref 13.0–17.1)
LYMPH#: 1 10*3/uL (ref 0.9–3.3)
LYMPH%: 92 % — ABNORMAL HIGH (ref 14.0–49.0)
MCH: 30.4 pg (ref 27.2–33.4)
MCHC: 32.7 g/dL (ref 32.0–36.0)
MCV: 93 fL (ref 79.3–98.0)
MONO#: 0 10*3/uL — ABNORMAL LOW (ref 0.1–0.9)
MONO%: 2.7 % (ref 0.0–14.0)
NEUT#: 0.1 10*3/uL — CL (ref 1.5–6.5)
NEUT%: 5.3 % — ABNORMAL LOW (ref 39.0–75.0)
NRBC: 0 % (ref 0–0)
Platelets: 8 10*3/uL — CL (ref 140–400)
RBC: 2.99 10*6/uL — AB (ref 4.20–5.82)
RDW: 18.9 % — AB (ref 11.0–14.6)
WBC: 1.1 10*3/uL — ABNORMAL LOW (ref 4.0–10.3)

## 2014-04-16 LAB — HOLD TUBE, BLOOD BANK

## 2014-04-16 MED ORDER — SODIUM CHLORIDE 0.9 % IV SOLN
250.0000 mL | Freq: Once | INTRAVENOUS | Status: AC
  Administered 2014-04-16: 250 mL via INTRAVENOUS

## 2014-04-16 MED ORDER — ACETAMINOPHEN 325 MG PO TABS: 650.0000 mg | ORAL_TABLET | Freq: Once | ORAL | Status: AC

## 2014-04-16 MED ORDER — ONDANSETRON 8 MG/50ML IVPB (CHCC)
8.0000 mg | Freq: Once | INTRAVENOUS | Status: AC
  Administered 2014-04-16: 8 mg via INTRAVENOUS

## 2014-04-16 MED ORDER — HEPARIN SOD (PORK) LOCK FLUSH 100 UNIT/ML IV SOLN
500.0000 [IU] | Freq: Every day | INTRAVENOUS | Status: DC | PRN
  Filled 2014-04-16: qty 5

## 2014-04-16 MED ORDER — LACTATED RINGERS IV SOLN
100.0000 mg/m2 | Freq: Every day | INTRAVENOUS | Status: DC
  Administered 2014-04-16: 200 mg via INTRAVENOUS
  Filled 2014-04-16: qty 40

## 2014-04-16 MED ORDER — DIPHENHYDRAMINE HCL 25 MG PO CAPS
ORAL_CAPSULE | ORAL | Status: AC
  Filled 2014-04-16: qty 1

## 2014-04-16 MED ORDER — ACETAMINOPHEN 325 MG PO TABS
ORAL_TABLET | ORAL | Status: AC
  Filled 2014-04-16: qty 2

## 2014-04-16 MED ORDER — SODIUM CHLORIDE 0.9 % IJ SOLN
10.0000 mL | INTRAMUSCULAR | Status: DC | PRN
  Administered 2014-04-16: 10 mL
  Filled 2014-04-16: qty 10

## 2014-04-16 MED ORDER — SODIUM CHLORIDE 0.9 % IV SOLN
Freq: Once | INTRAVENOUS | Status: AC
Start: 2014-04-16 — End: 2014-04-16
  Administered 2014-04-16: 11:00:00 via INTRAVENOUS

## 2014-04-16 MED ORDER — DIPHENHYDRAMINE HCL 25 MG PO CAPS
25.0000 mg | ORAL_CAPSULE | Freq: Once | ORAL | Status: AC
  Administered 2014-04-16: 25 mg via ORAL

## 2014-04-16 MED ORDER — ONDANSETRON 8 MG/NS 50 ML IVPB
INTRAVENOUS | Status: AC
  Filled 2014-04-16: qty 8

## 2014-04-16 MED ORDER — METHYLPREDNISOLONE SODIUM SUCC 125 MG IJ SOLR
60.0000 mg | Freq: Once | INTRAMUSCULAR | Status: AC
  Administered 2014-04-16: 60 mg via INTRAVENOUS

## 2014-04-16 MED ORDER — ACETAMINOPHEN 325 MG PO TABS
650.0000 mg | ORAL_TABLET | Freq: Once | ORAL | Status: AC
  Administered 2014-04-16: 650 mg via ORAL

## 2014-04-16 MED ORDER — HEPARIN SOD (PORK) LOCK FLUSH 100 UNIT/ML IV SOLN
500.0000 [IU] | Freq: Once | INTRAVENOUS | Status: AC | PRN
  Administered 2014-04-16: 500 [IU]
  Filled 2014-04-16: qty 5

## 2014-04-16 MED ORDER — DIPHENHYDRAMINE HCL 25 MG PO CAPS: 25.0000 mg | ORAL_CAPSULE | Freq: Once | ORAL | Status: AC

## 2014-04-16 MED ORDER — METHYLPREDNISOLONE SODIUM SUCC 125 MG IJ SOLR
INTRAMUSCULAR | Status: AC
  Filled 2014-04-16: qty 2

## 2014-04-16 NOTE — Patient Instructions (Signed)
[facility] Discharge Instructions for Patients Receiving Chemotherapy  Today you received the following chemotherapy agents: vidaza  To help prevent nausea and vomiting after your treatment, we encourage you to take your nausea medication.  Take it as often as prescribed.     If you develop nausea and vomiting that is not controlled by your nausea medication, call the clinic. If it is after clinic hours your family physician or the after hours number for the clinic or go to the Emergency Department.   BELOW ARE SYMPTOMS THAT SHOULD BE REPORTED IMMEDIATELY:  *FEVER GREATER THAN 100.5 F  *CHILLS WITH OR WITHOUT FEVER  NAUSEA AND VOMITING THAT IS NOT CONTROLLED WITH YOUR NAUSEA MEDICATION  *UNUSUAL SHORTNESS OF BREATH  *UNUSUAL BRUISING OR BLEEDING  TENDERNESS IN MOUTH AND THROAT WITH OR WITHOUT PRESENCE OF ULCERS  *URINARY PROBLEMS  *BOWEL PROBLEMS  UNUSUAL RASH Items with * indicate a potential emergency and should be followed up as soon as possible.  Feel free to call the clinic you have any questions or concerns. The clinic phone number is [phone].   I have been informed and understand all the instructions given to me. I know to contact the clinic, my physician, or go to the Emergency Department if any problems should occur. I do not have any questions at this time, but understand that I may call the clinic during office hours   should I have any questions or need assistance in obtaining follow up care.    __________________________________________  _____________  __________ Signature of Patient or Authorized Representative            Date                   Time    __________________________________________ Nurse's Signature     Platelet Transfusion Information This is information about transfusions of platelets. Platelets are tiny cells made by the bone marrow and found in the blood. When a blood vessel is damaged platelets rush to the damaged  area to help form a clot. This begins the healing process. When platelets get very low your blood may have trouble clotting. This may be from:  Illness.  Blood disorder.  Chemotherapy to treat cancer. Often lower platelet counts do not usually cause problems.  Platelets usually last for 7 to 10 days. If they are not used not used in an injury, they are broken down by the liver or spleen. Symptoms of low platelet count include:  Nosebleeds.  Bleeding gums.  Heavy periods.  Bruising and tiny blood spots in the skin.  Pin point spots of bleeding are called (petechiae).  Larger bruises (purpura).  Bleeding can be more serious if it happens in the brain or bowel. Platelet transfusions are often used to keep the platelet count at an acceptable level. Serious bleeding due to low platelets is uncommon. RISKS AND COMPLICATIONS Severe side effects from platelet transfusions are uncommon. Minor reactions may include:  Itching.  Rashes.  High temperature and shivering. Medications are available to stop transfusion reactions. Let your caregivers know if you develop any of the above problems.  If you are having platelet transfusions frequently they may get less effective. This is called becoming refractory to platelets. It is uncommon. This can happen from non-immune causes and immune causes. Non-immune causes include:  High temperatures.  Some medications.  An enlarged spleen. Immune causes happen when your body discovers the platelets are not your own and begin making antibodies against them.  The antibodies kill the platelets quickly. Even with platelet transfusions you may still notice problems with bleeding or bruising. Let your caregivers know about this. Other things can be done to help if this happens.  BEFORE THE PROCEDURE   Your doctors will check your platelet count regularly.  If the platelet count is too low it may be necessary to have a platelet transfusion.  This is  more important before certain procedures with a risk of bleeding such as a spinal tap.  Platelet transfusion reduces the risk of bleeding during or after the procedure.  Except in emergencies, giving a transfusion requires a written consent. Before blood is taken from a donor, a complete history is taken to make sure the person has no history of previous diseases, nor engages in risky social behavior. Examples of this are intravenous drug use or sexual activity with multiple partners. This could lead to infected blood or blood products being used. This history is done even in spite of the extensive testing to make sure the blood is safe. All blood products transfused are tested to make sure it is a match for the person getting the blood. It is also checked for infections. Blood is the safest it has ever been. The risk of getting an infection is very low. PROCEDURE  The platelets are stored in small plastic bags which are kept at a low temperature.  Each bag is called a unit and sometimes two units are given. They are given through an intravenous line by drip infusion over about one half hour.  Usually blood is collected from multiple people to get enough to transfuse.  Sometimes, the platelets are collected from a single person. This is done using a special machine that separates the platelets from the blood. The machine is called an apheresis machine. Platelets collected in this way are called apheresed platelets. Apheresed platelets reduce the risk of becoming sensitive to the platelets. This lowers the chances of having a transfusion reaction.  As it only takes a short time to give the platelets, this treatment can be given in an outpatients department. Platelets can also be given before or after other treatments. SEEK IMMEDIATE MEDICAL CARE IF: Any of the following symptoms over the next 12 hours or several days:  Shaking chills.  Fever with a temperature greater than 102 F (38.9 C)  develops.  Back pain or muscle pain.  People around you feel you are not acting correctly, or you are confused.  Blood in the urine or bowel movements or bleeding from any place in your body.  Shortness of breath, or difficulty breathing.  Dizziness.  Fainting.  You break out in a rash or develop hives.  You have a decrease in the amount of urine you are putting out, or the urine turns a dark color or changes to pink, red, or Rhodus.  A severe headache or stiff neck.  Bruising more easily. Document Released: 10/08/2007 Document Revised: 03/04/2012 Document Reviewed: 10/08/2007 [facility] Patient Information 2014 [place].

## 2014-04-17 ENCOUNTER — Ambulatory Visit (HOSPITAL_BASED_OUTPATIENT_CLINIC_OR_DEPARTMENT_OTHER): Payer: Medicare Other

## 2014-04-17 VITALS — BP 123/61 | HR 74 | Temp 98.4°F

## 2014-04-17 DIAGNOSIS — C9200 Acute myeloblastic leukemia, not having achieved remission: Secondary | ICD-10-CM

## 2014-04-17 DIAGNOSIS — Z5111 Encounter for antineoplastic chemotherapy: Secondary | ICD-10-CM

## 2014-04-17 LAB — PREPARE PLATELET PHERESIS: Unit division: 0

## 2014-04-17 MED ORDER — SODIUM CHLORIDE 0.9 % IV SOLN
Freq: Once | INTRAVENOUS | Status: AC
  Administered 2014-04-17: 10:00:00 via INTRAVENOUS

## 2014-04-17 MED ORDER — ONDANSETRON 8 MG/50ML IVPB (CHCC)
8.0000 mg | Freq: Once | INTRAVENOUS | Status: AC
  Administered 2014-04-17: 8 mg via INTRAVENOUS

## 2014-04-17 MED ORDER — ONDANSETRON 8 MG/NS 50 ML IVPB
INTRAVENOUS | Status: AC
  Filled 2014-04-17: qty 8

## 2014-04-17 MED ORDER — SODIUM CHLORIDE 0.9 % IJ SOLN
10.0000 mL | INTRAMUSCULAR | Status: DC | PRN
  Administered 2014-04-17: 10 mL
  Filled 2014-04-17: qty 10

## 2014-04-17 MED ORDER — HEPARIN SOD (PORK) LOCK FLUSH 100 UNIT/ML IV SOLN
500.0000 [IU] | Freq: Once | INTRAVENOUS | Status: AC | PRN
  Administered 2014-04-17: 500 [IU]
  Filled 2014-04-17: qty 5

## 2014-04-17 MED ORDER — AZACITIDINE CHEMO INJECTION 100 MG
100.0000 mg/m2 | Freq: Every day | INTRAMUSCULAR | Status: DC
  Administered 2014-04-17: 200 mg via INTRAVENOUS
  Filled 2014-04-17: qty 40

## 2014-04-17 NOTE — Patient Instructions (Signed)
[facility] Discharge Instructions for Patients Receiving Chemotherapy  Today you received the following chemotherapy agents vidaza   To help prevent nausea and vomiting after your treatment, we encourage you to take your nausea medication as directed. If you develop nausea and vomiting that is not controlled by your nausea medication, call the clinic.   BELOW ARE SYMPTOMS THAT SHOULD BE REPORTED IMMEDIATELY:  *FEVER GREATER THAN 100.5 F  *CHILLS WITH OR WITHOUT FEVER  NAUSEA AND VOMITING THAT IS NOT CONTROLLED WITH YOUR NAUSEA MEDICATION  *UNUSUAL SHORTNESS OF BREATH  *UNUSUAL BRUISING OR BLEEDING  TENDERNESS IN MOUTH AND THROAT WITH OR WITHOUT PRESENCE OF ULCERS  *URINARY PROBLEMS  *BOWEL PROBLEMS  UNUSUAL RASH Items with * indicate a potential emergency and should be followed up as soon as possible.  Feel free to call the clinic you have any questions or concerns. The clinic phone number is [phone].  

## 2014-04-20 ENCOUNTER — Other Ambulatory Visit (HOSPITAL_BASED_OUTPATIENT_CLINIC_OR_DEPARTMENT_OTHER): Payer: Medicare Other

## 2014-04-20 ENCOUNTER — Ambulatory Visit (HOSPITAL_BASED_OUTPATIENT_CLINIC_OR_DEPARTMENT_OTHER): Payer: Medicare Other

## 2014-04-20 ENCOUNTER — Other Ambulatory Visit: Payer: Self-pay | Admitting: Hematology and Oncology

## 2014-04-20 VITALS — BP 107/58 | HR 67 | Temp 97.1°F | Resp 20

## 2014-04-20 DIAGNOSIS — D696 Thrombocytopenia, unspecified: Secondary | ICD-10-CM

## 2014-04-20 DIAGNOSIS — C9200 Acute myeloblastic leukemia, not having achieved remission: Secondary | ICD-10-CM

## 2014-04-20 DIAGNOSIS — Z5111 Encounter for antineoplastic chemotherapy: Secondary | ICD-10-CM

## 2014-04-20 LAB — CBC WITH DIFFERENTIAL/PLATELET
BASO%: 0 % (ref 0.0–2.0)
Basophils Absolute: 0 10*3/uL (ref 0.0–0.1)
EOS%: 0 % (ref 0.0–7.0)
Eosinophils Absolute: 0 10*3/uL (ref 0.0–0.5)
HEMATOCRIT: 26.6 % — AB (ref 38.4–49.9)
HEMOGLOBIN: 9.1 g/dL — AB (ref 13.0–17.1)
LYMPH#: 0.9 10*3/uL (ref 0.9–3.3)
LYMPH%: 87 % — ABNORMAL HIGH (ref 14.0–49.0)
MCH: 31.3 pg (ref 27.2–33.4)
MCHC: 34.2 g/dL (ref 32.0–36.0)
MCV: 91.4 fL (ref 79.3–98.0)
MONO#: 0 10*3/uL — ABNORMAL LOW (ref 0.1–0.9)
MONO%: 0.9 % (ref 0.0–14.0)
NEUT#: 0.1 10*3/uL — CL (ref 1.5–6.5)
NEUT%: 12.1 % — AB (ref 39.0–75.0)
Platelets: 9 10*3/uL — CL (ref 140–400)
RBC: 2.91 10*6/uL — ABNORMAL LOW (ref 4.20–5.82)
RDW: 18.4 % — ABNORMAL HIGH (ref 11.0–14.6)
WBC: 1.1 10*3/uL — ABNORMAL LOW (ref 4.0–10.3)
nRBC: 0 % (ref 0–0)

## 2014-04-20 LAB — HOLD TUBE, BLOOD BANK

## 2014-04-20 MED ORDER — HEPARIN SOD (PORK) LOCK FLUSH 100 UNIT/ML IV SOLN
500.0000 [IU] | Freq: Once | INTRAVENOUS | Status: AC | PRN
  Administered 2014-04-20: 500 [IU]
  Filled 2014-04-20: qty 5

## 2014-04-20 MED ORDER — LACTATED RINGERS IV SOLN
100.0000 mg/m2 | Freq: Every day | INTRAVENOUS | Status: DC
  Administered 2014-04-20: 200 mg via INTRAVENOUS
  Filled 2014-04-20: qty 40

## 2014-04-20 MED ORDER — DIPHENHYDRAMINE HCL 25 MG PO CAPS
ORAL_CAPSULE | ORAL | Status: AC
  Filled 2014-04-20: qty 1

## 2014-04-20 MED ORDER — ACETAMINOPHEN 325 MG PO TABS
650.0000 mg | ORAL_TABLET | Freq: Once | ORAL | Status: AC
  Administered 2014-04-20: 650 mg via ORAL

## 2014-04-20 MED ORDER — SODIUM CHLORIDE 0.9 % IV SOLN
Freq: Once | INTRAVENOUS | Status: AC
  Administered 2014-04-20: 12:00:00 via INTRAVENOUS

## 2014-04-20 MED ORDER — ONDANSETRON 8 MG/50ML IVPB (CHCC)
8.0000 mg | Freq: Once | INTRAVENOUS | Status: AC
  Administered 2014-04-20: 8 mg via INTRAVENOUS

## 2014-04-20 MED ORDER — ONDANSETRON 8 MG/NS 50 ML IVPB
INTRAVENOUS | Status: AC
  Filled 2014-04-20: qty 8

## 2014-04-20 MED ORDER — SODIUM CHLORIDE 0.9 % IJ SOLN
10.0000 mL | INTRAMUSCULAR | Status: DC | PRN
  Administered 2014-04-20: 10 mL
  Filled 2014-04-20: qty 10

## 2014-04-20 MED ORDER — ACETAMINOPHEN 325 MG PO TABS
ORAL_TABLET | ORAL | Status: AC
  Filled 2014-04-20: qty 2

## 2014-04-20 MED ORDER — DIPHENHYDRAMINE HCL 25 MG PO CAPS
25.0000 mg | ORAL_CAPSULE | Freq: Once | ORAL | Status: AC
  Administered 2014-04-20: 25 mg via ORAL

## 2014-04-20 MED ORDER — SODIUM CHLORIDE 0.9 % IV SOLN
250.0000 mL | Freq: Once | INTRAVENOUS | Status: AC
  Administered 2014-04-20: 250 mL via INTRAVENOUS

## 2014-04-20 NOTE — Progress Notes (Signed)
Confirmed with Cameo, desk nurse re:  Dr. Alvy Bimler reviewed lab results today and proceed with chemo and platelets as ordered.

## 2014-04-20 NOTE — Patient Instructions (Addendum)
[name] Discharge Instructions for Patients Receiving Chemotherapy  Today you received the following chemotherapy agents :  Vidaza.  To help prevent nausea and vomiting after your treatment, we encourage you to take your nausea medication as instructed by your physician.   If you develop nausea and vomiting that is not controlled by your nausea medication, call the clinic.   BELOW ARE SYMPTOMS THAT SHOULD BE REPORTED IMMEDIATELY:  *FEVER GREATER THAN 100.5 F  *CHILLS WITH OR WITHOUT FEVER  NAUSEA AND VOMITING THAT IS NOT CONTROLLED WITH YOUR NAUSEA MEDICATION  *UNUSUAL SHORTNESS OF BREATH  *UNUSUAL BRUISING OR BLEEDING  TENDERNESS IN MOUTH AND THROAT WITH OR WITHOUT PRESENCE OF ULCERS  *URINARY PROBLEMS  *BOWEL PROBLEMS  UNUSUAL RASH Items with * indicate a potential emergency and should be followed up as soon as possible.  Feel free to call the clinic you have any questions or concerns. The clinic phone number is [phone].   Platelet Transfusion Information This is information about transfusions of platelets. Platelets are tiny cells made by the bone marrow and found in the blood. When a blood vessel is damaged platelets rush to the damaged area to help form a clot. This begins the healing process. When platelets get very low your blood may have trouble clotting. This may be from:  Illness.  Blood disorder.  Chemotherapy to treat cancer. Often lower platelet counts do not usually cause problems.  Platelets usually last for 7 to 10 days. If they are not used not used in an injury, they are broken down by the liver or spleen. Symptoms of low platelet count include:  Nosebleeds.  Bleeding gums.  Heavy periods.  Bruising and tiny blood spots in the skin.  Pin point spots of bleeding are called (petechiae).  Larger bruises (purpura).  Bleeding can be more serious if it happens in the brain or bowel. Platelet transfusions are often used to  keep the platelet count at an acceptable level. Serious bleeding due to low platelets is uncommon. RISKS AND COMPLICATIONS Severe side effects from platelet transfusions are uncommon. Minor reactions may include:  Itching.  Rashes.  High temperature and shivering. Medications are available to stop transfusion reactions. Let your caregivers know if you develop any of the above problems.  If you are having platelet transfusions frequently they may get less effective. This is called becoming refractory to platelets. It is uncommon. This can happen from non-immune causes and immune causes. Non-immune causes include:  High temperatures.  Some medications.  An enlarged spleen. Immune causes happen when your body discovers the platelets are not your own and begin making antibodies against them. The antibodies kill the platelets quickly. Even with platelet transfusions you may still notice problems with bleeding or bruising. Let your caregivers know about this. Other things can be done to help if this happens.  BEFORE THE PROCEDURE   Your doctors will check your platelet count regularly.  If the platelet count is too low it may be necessary to have a platelet transfusion.  This is more important before certain procedures with a risk of bleeding such as a spinal tap.  Platelet transfusion reduces the risk of bleeding during or after the procedure.  Except in emergencies, giving a transfusion requires a written consent. Before blood is taken from a donor, a complete history is taken to make sure the person has no history of previous diseases, nor engages in risky social behavior. Examples of this are intravenous drug use or sexual  activity with multiple partners. This could lead to infected blood or blood products being used. This history is done even in spite of the extensive testing to make sure the blood is safe. All blood products transfused are tested to make sure it is a match for the person  getting the blood. It is also checked for infections. Blood is the safest it has ever been. The risk of getting an infection is very low. PROCEDURE  The platelets are stored in small plastic bags which are kept at a low temperature.  Each bag is called a unit and sometimes two units are given. They are given through an intravenous line by drip infusion over about one half hour.  Usually blood is collected from multiple people to get enough to transfuse.  Sometimes, the platelets are collected from a single person. This is done using a special machine that separates the platelets from the blood. The machine is called an apheresis machine. Platelets collected in this way are called apheresed platelets. Apheresed platelets reduce the risk of becoming sensitive to the platelets. This lowers the chances of having a transfusion reaction.  As it only takes a short time to give the platelets, this treatment can be given in an outpatients department. Platelets can also be given before or after other treatments. SEEK IMMEDIATE MEDICAL CARE IF: Any of the following symptoms over the next 12 hours or several days:  Shaking chills.  Fever with a temperature greater than 102 F (38.9 C) develops.  Back pain or muscle pain.  People around you feel you are not acting correctly, or you are confused.  Blood in the urine or bowel movements or bleeding from any place in your body.  Shortness of breath, or difficulty breathing.  Dizziness.  Fainting.  You break out in a rash or develop hives.  You have a decrease in the amount of urine you are putting out, or the urine turns a dark color or changes to pink, red, or [place].  A severe headache or stiff neck.  Bruising more easily. Document Released: 10/08/2007 Document Revised: 03/04/2012 Document Reviewed: 10/08/2007 [facility] Patient Information 2014 [place].

## 2014-04-21 ENCOUNTER — Ambulatory Visit (HOSPITAL_BASED_OUTPATIENT_CLINIC_OR_DEPARTMENT_OTHER): Payer: Medicare Other

## 2014-04-21 VITALS — BP 107/64 | HR 72 | Temp 97.5°F | Resp 19

## 2014-04-21 DIAGNOSIS — C9200 Acute myeloblastic leukemia, not having achieved remission: Secondary | ICD-10-CM

## 2014-04-21 DIAGNOSIS — Z5111 Encounter for antineoplastic chemotherapy: Secondary | ICD-10-CM

## 2014-04-21 LAB — PREPARE PLATELET PHERESIS: Unit division: 0

## 2014-04-21 MED ORDER — SODIUM CHLORIDE 0.9 % IJ SOLN
10.0000 mL | INTRAMUSCULAR | Status: DC | PRN
  Administered 2014-04-21: 10 mL
  Filled 2014-04-21: qty 10

## 2014-04-21 MED ORDER — SODIUM CHLORIDE 0.9 % IV SOLN
Freq: Once | INTRAVENOUS | Status: AC
  Administered 2014-04-21: 11:00:00 via INTRAVENOUS

## 2014-04-21 MED ORDER — LACTATED RINGERS IV SOLN
100.0000 mg/m2 | Freq: Every day | INTRAVENOUS | Status: DC
  Administered 2014-04-21: 200 mg via INTRAVENOUS
  Filled 2014-04-21: qty 40

## 2014-04-21 MED ORDER — ONDANSETRON 8 MG/50ML IVPB (CHCC)
8.0000 mg | Freq: Once | INTRAVENOUS | Status: AC
  Administered 2014-04-21: 8 mg via INTRAVENOUS

## 2014-04-21 MED ORDER — HEPARIN SOD (PORK) LOCK FLUSH 100 UNIT/ML IV SOLN
500.0000 [IU] | Freq: Once | INTRAVENOUS | Status: AC | PRN
  Administered 2014-04-21: 500 [IU]
  Filled 2014-04-21: qty 5

## 2014-04-21 MED ORDER — ONDANSETRON 8 MG/NS 50 ML IVPB
INTRAVENOUS | Status: AC
  Filled 2014-04-21: qty 8

## 2014-04-21 NOTE — Patient Instructions (Signed)
[facility] Discharge Instructions for Patients Receiving Chemotherapy  Today you received the following chemotherapy agents: Vidaza  To help prevent nausea and vomiting after your treatment, we encourage you to take your nausea medication as prescribed by your physician.   If you develop nausea and vomiting that is not controlled by your nausea medication, call the clinic.   BELOW ARE SYMPTOMS THAT SHOULD BE REPORTED IMMEDIATELY:  *FEVER GREATER THAN 100.5 F  *CHILLS WITH OR WITHOUT FEVER  NAUSEA AND VOMITING THAT IS NOT CONTROLLED WITH YOUR NAUSEA MEDICATION  *UNUSUAL SHORTNESS OF BREATH  *UNUSUAL BRUISING OR BLEEDING  TENDERNESS IN MOUTH AND THROAT WITH OR WITHOUT PRESENCE OF ULCERS  *URINARY PROBLEMS  *BOWEL PROBLEMS  UNUSUAL RASH Items with * indicate a potential emergency and should be followed up as soon as possible.  Feel free to call the clinic you have any questions or concerns. The clinic phone number is [phone].    

## 2014-04-21 NOTE — Progress Notes (Signed)
Treatment completed without difficulty. Okay to tx per Dr. Alvy Bimler on 04/20/14.

## 2014-04-22 ENCOUNTER — Other Ambulatory Visit: Payer: Self-pay | Admitting: Hematology and Oncology

## 2014-04-22 DIAGNOSIS — C9200 Acute myeloblastic leukemia, not having achieved remission: Secondary | ICD-10-CM

## 2014-04-23 ENCOUNTER — Non-Acute Institutional Stay (HOSPITAL_COMMUNITY)
Admission: AD | Admit: 2014-04-23 | Discharge: 2014-04-23 | Disposition: A | Discharge status: Home / Self Care | Payer: Medicare Other | Source: Ambulatory Visit | Attending: Hematology and Oncology | Admitting: Hematology and Oncology

## 2014-04-23 ENCOUNTER — Other Ambulatory Visit: Payer: Self-pay | Admitting: *Deleted

## 2014-04-23 ENCOUNTER — Other Ambulatory Visit (HOSPITAL_BASED_OUTPATIENT_CLINIC_OR_DEPARTMENT_OTHER): Payer: Medicare Other

## 2014-04-23 DIAGNOSIS — C9200 Acute myeloblastic leukemia, not having achieved remission: Secondary | ICD-10-CM | POA: Insufficient documentation

## 2014-04-23 LAB — CBC WITH DIFFERENTIAL/PLATELET
BASO%: 0 % (ref 0.0–2.0)
BASOS ABS: 0 10*3/uL (ref 0.0–0.1)
EOS%: 0.1 % (ref 0.0–7.0)
Eosinophils Absolute: 0 10*3/uL (ref 0.0–0.5)
HEMATOCRIT: 26.1 % — AB (ref 38.4–49.9)
HGB: 8.7 g/dL — ABNORMAL LOW (ref 13.0–17.1)
LYMPH#: 1 10*3/uL (ref 0.9–3.3)
LYMPH%: 95 % — ABNORMAL HIGH (ref 14.0–49.0)
MCH: 31.1 pg (ref 27.2–33.4)
MCHC: 33.3 g/dL (ref 32.0–36.0)
MCV: 93.6 fL (ref 79.3–98.0)
MONO#: 0 10*3/uL — ABNORMAL LOW (ref 0.1–0.9)
MONO%: 1.1 % (ref 0.0–14.0)
NEUT#: 0 10*3/uL — CL (ref 1.5–6.5)
NEUT%: 3.8 % — AB (ref 39.0–75.0)
Platelets: 9 10*3/uL — CL (ref 140–400)
RBC: 2.79 10*6/uL — ABNORMAL LOW (ref 4.20–5.82)
RDW: 19.9 % — ABNORMAL HIGH (ref 11.0–14.6)
WBC: 1 10*3/uL — ABNORMAL LOW (ref 4.0–10.3)

## 2014-04-23 LAB — HOLD TUBE, BLOOD BANK

## 2014-04-23 MED ORDER — SODIUM CHLORIDE 0.9 % IJ SOLN
10.0000 mL | INTRAMUSCULAR | Status: AC | PRN
  Administered 2014-04-23: 10 mL

## 2014-04-23 MED ORDER — SODIUM CHLORIDE 0.9 % IV SOLN
250.0000 mL | Freq: Once | INTRAVENOUS | Status: AC
  Administered 2014-04-23: 250 mL via INTRAVENOUS

## 2014-04-23 MED ORDER — SODIUM CHLORIDE 0.9 % IJ SOLN: 3.0000 mL | INTRAMUSCULAR | Status: DC | PRN

## 2014-04-23 MED ORDER — ACETAMINOPHEN 325 MG PO TABS
650.0000 mg | ORAL_TABLET | Freq: Once | ORAL | Status: AC
  Administered 2014-04-23: 650 mg via ORAL
  Filled 2014-04-23: qty 2

## 2014-04-23 MED ORDER — DIPHENHYDRAMINE HCL 25 MG PO CAPS
25.0000 mg | ORAL_CAPSULE | Freq: Once | ORAL | Status: AC
  Administered 2014-04-23: 25 mg via ORAL
  Filled 2014-04-23: qty 1

## 2014-04-23 MED ORDER — HEPARIN SOD (PORK) LOCK FLUSH 100 UNIT/ML IV SOLN
500.0000 [IU] | Freq: Every day | INTRAVENOUS | Status: AC | PRN
  Administered 2014-04-23: 500 [IU]
  Filled 2014-04-23: qty 5

## 2014-04-23 MED ORDER — HEPARIN SOD (PORK) LOCK FLUSH 100 UNIT/ML IV SOLN: 250.0000 [IU] | INTRAVENOUS | Status: DC | PRN

## 2014-04-23 NOTE — Procedures (Signed)
[facility]  Procedure Note  HARDIN HARDENBROOK GYI:[id number] DOB: [birth year] DOA: 04/23/2014   PCP: Precious Reel, MD   Associated Diagnosis: acute myelogenous leukemia   Procedure Note: Port accessed as per protocol, good blood return noted.  Pt received 1 unit of platelets as per order.  On completion of the platelets, POrt was flushed as per protocol and deaccessed.    Condition During Procedure:  Pt remained stable, No adverse affects noted. Pt tolerated well   Condition at Discharge: Port site dry and intact. Pt left ambulating with his friend in stable condition. Pt states he is feeling "fine."   Rosita Fire, [facility]

## 2014-04-23 NOTE — H&P (Signed)
Patient is here for transfusions only

## 2014-04-24 LAB — PREPARE PLATELET PHERESIS: Unit division: 0

## 2014-04-27 ENCOUNTER — Other Ambulatory Visit: Payer: Self-pay | Admitting: Hematology and Oncology

## 2014-04-27 ENCOUNTER — Other Ambulatory Visit (HOSPITAL_BASED_OUTPATIENT_CLINIC_OR_DEPARTMENT_OTHER): Payer: Medicare Other

## 2014-04-27 ENCOUNTER — Ambulatory Visit (HOSPITAL_COMMUNITY)
Admission: RE | Admit: 2014-04-27 | Discharge: 2014-04-27 | Disposition: A | Discharge status: Home / Self Care | Payer: Medicare Other | Source: Ambulatory Visit | Attending: Hematology and Oncology | Admitting: Hematology and Oncology

## 2014-04-27 ENCOUNTER — Non-Acute Institutional Stay (HOSPITAL_COMMUNITY)
Admission: AD | Admit: 2014-04-27 | Discharge: 2014-04-27 | Disposition: A | Discharge status: Home / Self Care | Payer: Medicare Other | Attending: Hematology and Oncology | Admitting: Hematology and Oncology

## 2014-04-27 DIAGNOSIS — D649 Anemia, unspecified: Secondary | ICD-10-CM | POA: Insufficient documentation

## 2014-04-27 DIAGNOSIS — C9200 Acute myeloblastic leukemia, not having achieved remission: Secondary | ICD-10-CM

## 2014-04-27 DIAGNOSIS — D696 Thrombocytopenia, unspecified: Secondary | ICD-10-CM

## 2014-04-27 LAB — TECHNOLOGIST REVIEW

## 2014-04-27 LAB — CBC WITH DIFFERENTIAL/PLATELET
BASO%: 0 % (ref 0.0–2.0)
BASOS ABS: 0 10*3/uL (ref 0.0–0.1)
EOS ABS: 0 10*3/uL (ref 0.0–0.5)
EOS%: 0.3 % (ref 0.0–7.0)
HCT: 23.2 % — ABNORMAL LOW (ref 38.4–49.9)
HGB: 7.7 g/dL — ABNORMAL LOW (ref 13.0–17.1)
LYMPH%: 95.5 % — ABNORMAL HIGH (ref 14.0–49.0)
MCH: 30.9 pg (ref 27.2–33.4)
MCHC: 33.3 g/dL (ref 32.0–36.0)
MCV: 92.9 fL (ref 79.3–98.0)
MONO#: 0 10*3/uL — ABNORMAL LOW (ref 0.1–0.9)
MONO%: 0.9 % (ref 0.0–14.0)
NEUT%: 3.3 % — ABNORMAL LOW (ref 39.0–75.0)
NEUTROS ABS: 0 10*3/uL — AB (ref 1.5–6.5)
PLATELETS: 6 10*3/uL — AB (ref 140–400)
RBC: 2.5 10*6/uL — ABNORMAL LOW (ref 4.20–5.82)
RDW: 19.6 % — ABNORMAL HIGH (ref 11.0–14.6)
WBC: 1.1 10*3/uL — ABNORMAL LOW (ref 4.0–10.3)
lymph#: 1.1 10*3/uL (ref 0.9–3.3)

## 2014-04-27 LAB — HOLD TUBE, BLOOD BANK

## 2014-04-27 LAB — PREPARE RBC (CROSSMATCH)

## 2014-04-27 MED ORDER — HEPARIN SOD (PORK) LOCK FLUSH 100 UNIT/ML IV SOLN: 250.0000 [IU] | INTRAVENOUS | Status: DC | PRN

## 2014-04-27 MED ORDER — SODIUM CHLORIDE 0.9 % IJ SOLN: 3.0000 mL | INTRAMUSCULAR | Status: DC | PRN

## 2014-04-27 MED ORDER — SODIUM CHLORIDE 0.9 % IV SOLN
250.0000 mL | Freq: Once | INTRAVENOUS | Status: AC
  Administered 2014-04-27: 250 mL via INTRAVENOUS

## 2014-04-27 MED ORDER — DIPHENHYDRAMINE HCL 25 MG PO CAPS
25.0000 mg | ORAL_CAPSULE | Freq: Once | ORAL | Status: AC
  Administered 2014-04-27: 25 mg via ORAL
  Filled 2014-04-27: qty 1

## 2014-04-27 MED ORDER — ACETAMINOPHEN 325 MG PO TABS
650.0000 mg | ORAL_TABLET | Freq: Once | ORAL | Status: AC
  Administered 2014-04-27: 650 mg via ORAL
  Filled 2014-04-27: qty 2

## 2014-04-27 MED ORDER — FUROSEMIDE 10 MG/ML IJ SOLN
20.0000 mg | Freq: Once | INTRAMUSCULAR | Status: AC
  Administered 2014-04-27: 20 mg via INTRAVENOUS
  Filled 2014-04-27: qty 2

## 2014-04-27 MED ORDER — HEPARIN SOD (PORK) LOCK FLUSH 100 UNIT/ML IV SOLN
500.0000 [IU] | Freq: Every day | INTRAVENOUS | Status: AC | PRN
  Administered 2014-04-27: 500 [IU]
  Filled 2014-04-27: qty 5

## 2014-04-27 MED ORDER — SODIUM CHLORIDE 0.9 % IJ SOLN
10.0000 mL | INTRAMUSCULAR | Status: AC | PRN
  Administered 2014-04-27: 10 mL

## 2014-04-27 NOTE — Procedures (Signed)
[facility]  Procedure Note  Kyle Brewer OFH:[id number] DOB: May 05, 1927 DOA: 04/27/2014   PCP: Natale Lay MD   Associated Diagnosis: AML (acute myelogenous leukemia)    Procedure Note: Premeds given. Patient received 1 units platelets and 2 Units PRBC with no complications. IV Lasix given post blood transfusion.    Condition During Procedure: Patient tolerated well. No Complaints.    Condition at Discharge:   Patient tolerated well, patient in no apparent distress. Patient reports he has f/u information with MD scheduled. Instructed patient to seek medical attention if any changes in condition or concerns. Patient acknowledges. Patient left day hospital with belongings ambulatory via self.    Wendie Simmer, RN  [facility]

## 2014-04-27 NOTE — H&P (Signed)
The patient is here for transfusion only  

## 2014-04-27 NOTE — Discharge Instructions (Signed)
Acute Myeloid Leukemia, Adult Acute myeloid leukemia (AML) is a rapid growth cancer of the blood and the soft tissue inside your bones (bone marrow). Normally, your bone marrow makes blast cells that develop into important white blood cells called myeloid cells (and several other types of mature blood cells). These mature cells help to fight infection, carry oxygen, and stop bleeding. With AML, the bone marrow makes abnormal, or unformed myeloid blast cells. These abnormal cells develop into leukemia cells and occupy space in the blood where healthy cells need room. The rapidly growing leukemia cells begin to take over. Leukemia cells do not fight infection or carry out other important jobs in the blood, and symptoms of infection and illness appear. There are several types of AML depending on the stage and characteristics of the leukemia cells. CAUSES  Experts are not clear on what causes the bone marrow to produce abnormal cells that lead to leukemia. For the most part, it does not appear to be genetic, but related to other external factors.  RISK FACTORS Risk factors include:  Age 50 years and older.  Male.  Smoking.  History of chemotherapy or radiation therapy.  Exposure to chemicals.  Other blood disorders.  Genetic disorders, such as Down Syndrome. SYMPTOMS   Poor appetite.  Tiring easily.  Weakness.  Shortness of breath.  Repeat infections.  Low-grade fevers.  Bone pain aches.  Joint pain and aches.  Abdominal pain.  Pale skin.  Bruising.  Nosebleeds and easy bleeding from minor cuts.  Slow healing of cuts.  Spots on the skin.  Swollen glands.  Headache.  Weight loss.  Swollen gums.  Lumps under the skin. DIAGNOSIS  The diagnosis of AML is made by tests such as:  Blood tests to check blood cell counts and the shape of the blood cells.  Sampling parts of bone that make blood cells (bone marrow).  Genetic testing.  Sampling spinal fluid for  leukemia cells.  A biopsy of lumps to check for leukemia cells.  X-ray exams, ultrasonography, or CT scans. TREATMENT  The type of AML diagnosed will guide treatment options. Treatment can last for several months up to 2 3 years. Treatment aims to destroy leukemia cells as well as stop new diseased cells from growing. Treatment may include:  Chemotherapy.  Radiation therapy to kill cancer cells.  Targeted medicines to treat specific chromosomal mutations.  Stem cell transplant to replace diseased bone marrow with healthy donor bone marrow.  Experimental treatments through clinical trials.  In rare cases, surgery. HOME CARE INSTRUCTIONS  When you are on chemotherapy:  You and and any visitors should wash hands often. Wash hands before meals, after being outside, and after using the toilet.  Keep your teeth and gums clean and well cared for. Use a soft toothbrush.  Talk with your health care provider about the safety of immunizations.  When visiting a healthcare facility, ask about side entrances or waiting areas where you will not be exposed to infections.  Only take over-the-counter or prescription medicines for pain, discomfort, or fever as directed by your health care provider.  Use a good sun block and clothing to prevent sun exposure.  Usually, it is recommended that other family members receive an influenza shot every year. SEEK MEDICAL CARE IF:   You have a cough or cold symptoms.  You have a sore throat.  You have painful urination.  You have frequent diarrhea.  You have frequent vomiting.  You have a skin rash.  You  have been exposed to chickenpox or measles, especially if you have not been immunized or are not immune to these illnesses. SEEK IMMEDIATE MEDICAL CARE IF:   You have a fever.  You have chills.  You have trouble breathing.  You have blood in you Document Released: 10/01/2013 Document Reviewed: 08/04/2013 [facility] Patient Information  2014 [place].

## 2014-04-28 LAB — TYPE AND SCREEN
ABO/RH(D): O POS
Antibody Screen: NEGATIVE
UNIT DIVISION: 0
Unit division: 0

## 2014-04-28 LAB — PREPARE PLATELET PHERESIS: Unit division: 0

## 2014-04-30 ENCOUNTER — Ambulatory Visit (HOSPITAL_BASED_OUTPATIENT_CLINIC_OR_DEPARTMENT_OTHER): Payer: Medicare Other

## 2014-04-30 ENCOUNTER — Other Ambulatory Visit (HOSPITAL_BASED_OUTPATIENT_CLINIC_OR_DEPARTMENT_OTHER): Payer: Medicare Other

## 2014-04-30 ENCOUNTER — Other Ambulatory Visit: Payer: Self-pay | Admitting: *Deleted

## 2014-04-30 ENCOUNTER — Other Ambulatory Visit: Payer: Self-pay | Admitting: Hematology and Oncology

## 2014-04-30 VITALS — BP 121/59 | HR 64 | Temp 97.5°F | Resp 18

## 2014-04-30 DIAGNOSIS — C9200 Acute myeloblastic leukemia, not having achieved remission: Secondary | ICD-10-CM

## 2014-04-30 DIAGNOSIS — D696 Thrombocytopenia, unspecified: Secondary | ICD-10-CM

## 2014-04-30 DIAGNOSIS — D649 Anemia, unspecified: Secondary | ICD-10-CM

## 2014-04-30 LAB — CBC WITH DIFFERENTIAL/PLATELET
BASO%: 0 % (ref 0.0–2.0)
BASOS ABS: 0 10*3/uL (ref 0.0–0.1)
EOS%: 0 % (ref 0.0–7.0)
Eosinophils Absolute: 0 10*3/uL (ref 0.0–0.5)
HEMATOCRIT: 28.9 % — AB (ref 38.4–49.9)
HGB: 9.8 g/dL — ABNORMAL LOW (ref 13.0–17.1)
LYMPH%: 93.1 % — AB (ref 14.0–49.0)
MCH: 31.3 pg (ref 27.2–33.4)
MCHC: 33.9 g/dL (ref 32.0–36.0)
MCV: 92.3 fL (ref 79.3–98.0)
MONO#: 0.1 10*3/uL (ref 0.1–0.9)
MONO%: 4.6 % (ref 0.0–14.0)
NEUT#: 0 10*3/uL — CL (ref 1.5–6.5)
NEUT%: 2.3 % — AB (ref 39.0–75.0)
PLATELETS: 7 10*3/uL — AB (ref 140–400)
RBC: 3.13 10*6/uL — ABNORMAL LOW (ref 4.20–5.82)
RDW: 16.6 % — ABNORMAL HIGH (ref 11.0–14.6)
WBC: 1.3 10*3/uL — ABNORMAL LOW (ref 4.0–10.3)
lymph#: 1.2 10*3/uL (ref 0.9–3.3)
nRBC: 0 % (ref 0–0)

## 2014-04-30 LAB — TECHNOLOGIST REVIEW: Technologist Review: 2

## 2014-04-30 LAB — HOLD TUBE, BLOOD BANK

## 2014-04-30 MED ORDER — HEPARIN SOD (PORK) LOCK FLUSH 100 UNIT/ML IV SOLN
500.0000 [IU] | Freq: Every day | INTRAVENOUS | Status: AC | PRN
  Administered 2014-04-30: 500 [IU]
  Filled 2014-04-30: qty 5

## 2014-04-30 MED ORDER — ACETAMINOPHEN 325 MG PO TABS
650.0000 mg | ORAL_TABLET | Freq: Once | ORAL | Status: AC
  Administered 2014-04-30: 650 mg via ORAL

## 2014-04-30 MED ORDER — AMOXICILLIN-POT CLAVULANATE 875-125 MG PO TABS: 1.0000 | ORAL_TABLET | Freq: Two times a day (BID) | ORAL | Status: AC

## 2014-04-30 MED ORDER — DIPHENHYDRAMINE HCL 25 MG PO CAPS
25.0000 mg | ORAL_CAPSULE | Freq: Once | ORAL | Status: AC
  Administered 2014-04-30: 25 mg via ORAL

## 2014-04-30 MED ORDER — SODIUM CHLORIDE 0.9 % IJ SOLN
10.0000 mL | INTRAMUSCULAR | Status: AC | PRN
  Administered 2014-04-30: 10 mL
  Filled 2014-04-30: qty 10

## 2014-04-30 MED ORDER — ACETAMINOPHEN 325 MG PO TABS
ORAL_TABLET | ORAL | Status: AC
  Filled 2014-04-30: qty 2

## 2014-04-30 MED ORDER — SODIUM CHLORIDE 0.9 % IV SOLN: 250.0000 mL | Freq: Once | INTRAVENOUS | Status: DC

## 2014-04-30 MED ORDER — DIPHENHYDRAMINE HCL 25 MG PO CAPS
ORAL_CAPSULE | ORAL | Status: AC
  Filled 2014-04-30: qty 1

## 2014-04-30 NOTE — Telephone Encounter (Signed)
Patient in office today for labs, has a filling that has falling out of upper left molar and is inflamed and he feels it is going to abscess again like it did several months ago. Reviewed with Dr Alvy Bimler, since patient does not have adequate counts to have this tooth removed at this time, we will call in Rx for antibiotic. Discussed again with patient and escribe to [facility].

## 2014-05-01 LAB — PREPARE PLATELET PHERESIS: Unit division: 0

## 2014-05-04 ENCOUNTER — Ambulatory Visit (HOSPITAL_BASED_OUTPATIENT_CLINIC_OR_DEPARTMENT_OTHER): Payer: Medicare Other

## 2014-05-04 ENCOUNTER — Other Ambulatory Visit (HOSPITAL_BASED_OUTPATIENT_CLINIC_OR_DEPARTMENT_OTHER): Payer: Medicare Other

## 2014-05-04 ENCOUNTER — Other Ambulatory Visit: Payer: Self-pay | Admitting: Hematology and Oncology

## 2014-05-04 VITALS — BP 93/47 | HR 66 | Temp 97.8°F | Resp 20

## 2014-05-04 DIAGNOSIS — C9200 Acute myeloblastic leukemia, not having achieved remission: Secondary | ICD-10-CM

## 2014-05-04 DIAGNOSIS — D649 Anemia, unspecified: Secondary | ICD-10-CM

## 2014-05-04 LAB — CBC WITH DIFFERENTIAL/PLATELET
BASO%: 0 % (ref 0.0–2.0)
Basophils Absolute: 0 10*3/uL (ref 0.0–0.1)
EOS ABS: 0 10*3/uL (ref 0.0–0.5)
EOS%: 0 % (ref 0.0–7.0)
HCT: 28.1 % — ABNORMAL LOW (ref 38.4–49.9)
HGB: 9.5 g/dL — ABNORMAL LOW (ref 13.0–17.1)
LYMPH#: 1.1 10*3/uL (ref 0.9–3.3)
LYMPH%: 94.2 % — ABNORMAL HIGH (ref 14.0–49.0)
MCH: 31 pg (ref 27.2–33.4)
MCHC: 33.8 g/dL (ref 32.0–36.0)
MCV: 91.8 fL (ref 79.3–98.0)
MONO#: 0 10*3/uL — AB (ref 0.1–0.9)
MONO%: 0.8 % (ref 0.0–14.0)
NEUT%: 5 % — ABNORMAL LOW (ref 39.0–75.0)
NEUTROS ABS: 0.1 10*3/uL — AB (ref 1.5–6.5)
NRBC: 0 % (ref 0–0)
PLATELETS: 7 10*3/uL — AB (ref 140–400)
RBC: 3.06 10*6/uL — AB (ref 4.20–5.82)
RDW: 16.5 % — ABNORMAL HIGH (ref 11.0–14.6)
WBC: 1.2 10*3/uL — AB (ref 4.0–10.3)

## 2014-05-04 LAB — HOLD TUBE, BLOOD BANK

## 2014-05-04 MED ORDER — SODIUM CHLORIDE 0.9 % IV SOLN
250.0000 mL | Freq: Once | INTRAVENOUS | Status: AC
  Administered 2014-05-04: 250 mL via INTRAVENOUS

## 2014-05-04 MED ORDER — ACETAMINOPHEN 325 MG PO TABS
650.0000 mg | ORAL_TABLET | Freq: Once | ORAL | Status: AC
  Administered 2014-05-04: 650 mg via ORAL

## 2014-05-04 MED ORDER — HEPARIN SOD (PORK) LOCK FLUSH 100 UNIT/ML IV SOLN
500.0000 [IU] | Freq: Once | INTRAVENOUS | Status: AC
  Administered 2014-05-04: 500 [IU] via INTRAVENOUS
  Filled 2014-05-04: qty 5

## 2014-05-04 MED ORDER — DIPHENHYDRAMINE HCL 25 MG PO CAPS
25.0000 mg | ORAL_CAPSULE | Freq: Once | ORAL | Status: AC
  Administered 2014-05-04: 25 mg via ORAL

## 2014-05-04 MED ORDER — SODIUM CHLORIDE 0.9 % IJ SOLN
10.0000 mL | INTRAMUSCULAR | Status: DC | PRN
  Administered 2014-05-04: 10 mL via INTRAVENOUS
  Filled 2014-05-04: qty 10

## 2014-05-04 NOTE — Patient Instructions (Signed)
Platelet Transfusion Information This is information about transfusions of platelets. Platelets are tiny cells made by the bone marrow and found in the blood. When a blood vessel is damaged platelets rush to the damaged area to help form a clot. This begins the healing process. When platelets get very low your blood may have trouble clotting. This may be from:  Illness.  Blood disorder.  Chemotherapy to treat cancer. Often lower platelet counts do not usually cause problems.  Platelets usually last for 7 to 10 days. If they are not used not used in an injury, they are broken down by the liver or spleen. Symptoms of low platelet count include:  Nosebleeds.  Bleeding gums.  Heavy periods.  Bruising and tiny blood spots in the skin.  Pin point spots of bleeding are called (petechiae).  Larger bruises (purpura).  Bleeding can be more serious if it happens in the brain or bowel. Platelet transfusions are often used to keep the platelet count at an acceptable level. Serious bleeding due to low platelets is uncommon. RISKS AND COMPLICATIONS Severe side effects from platelet transfusions are uncommon. Minor reactions may include:  Itching.  Rashes.  High temperature and shivering. Medications are available to stop transfusion reactions. Let your caregivers know if you develop any of the above problems.  If you are having platelet transfusions frequently they may get less effective. This is called becoming refractory to platelets. It is uncommon. This can happen from non-immune causes and immune causes. Non-immune causes include:  High temperatures.  Some medications.  An enlarged spleen. Immune causes happen when your body discovers the platelets are not your own and begin making antibodies against them. The antibodies kill the platelets quickly. Even with platelet transfusions you may still notice problems with bleeding or bruising. Let your caregivers know about this. Other things  can be done to help if this happens.  BEFORE THE PROCEDURE   Your doctors will check your platelet count regularly.  If the platelet count is too low it may be necessary to have a platelet transfusion.  This is more important before certain procedures with a risk of bleeding such as a spinal tap.  Platelet transfusion reduces the risk of bleeding during or after the procedure.  Except in emergencies, giving a transfusion requires a written consent. Before blood is taken from a donor, a complete history is taken to make sure the person has no history of previous diseases, nor engages in risky social behavior. Examples of this are intravenous drug use or sexual activity with multiple partners. This could lead to infected blood or blood products being used. This history is done even in spite of the extensive testing to make sure the blood is safe. All blood products transfused are tested to make sure it is a match for the person getting the blood. It is also checked for infections. Blood is the safest it has ever been. The risk of getting an infection is very low. PROCEDURE  The platelets are stored in small plastic bags which are kept at a low temperature.  Each bag is called a unit and sometimes two units are given. They are given through an intravenous line by drip infusion over about one half hour.  Usually blood is collected from multiple people to get enough to transfuse.  Sometimes, the platelets are collected from a single person. This is done using a special machine that separates the platelets from the blood. The machine is called an apheresis machine. Platelets collected   in this way are called apheresed platelets. Apheresed platelets reduce the risk of becoming sensitive to the platelets. This lowers the chances of having a transfusion reaction.  As it only takes a short time to give the platelets, this treatment can be given in an outpatients department. Platelets can also be given  before or after other treatments. SEEK IMMEDIATE MEDICAL CARE IF: Any of the following symptoms over the next 12 hours or several days:  Shaking chills.  Fever with a temperature greater than 102 F (38.9 C) develops.  Back pain or muscle pain.  People around you feel you are not acting correctly, or you are confused.  Blood in the urine or bowel movements or bleeding from any place in your body.  Shortness of breath, or difficulty breathing.  Dizziness.  Fainting.  You break out in a rash or develop hives.  You have a decrease in the amount of urine you are putting out, or the urine turns a dark color or changes to pink, red, or [place].  A severe headache or stiff neck.  Bruising more easily. Document Released: 10/08/2007 Document Revised: 03/04/2012 Document Reviewed: 10/08/2007 [employer] Patient Information 2014 [employer].  

## 2014-05-05 LAB — PREPARE PLATELET PHERESIS: Unit division: 0

## 2014-05-06 ENCOUNTER — Other Ambulatory Visit: Payer: Self-pay | Admitting: *Deleted

## 2014-05-06 MED ORDER — AMLODIPINE BESYLATE 5 MG PO TABS: 5.0000 mg | ORAL_TABLET | Freq: Every day | ORAL | Status: DC

## 2014-05-07 ENCOUNTER — Other Ambulatory Visit: Payer: Self-pay | Admitting: Hematology and Oncology

## 2014-05-07 ENCOUNTER — Ambulatory Visit (HOSPITAL_BASED_OUTPATIENT_CLINIC_OR_DEPARTMENT_OTHER): Payer: Medicare Other

## 2014-05-07 ENCOUNTER — Other Ambulatory Visit (HOSPITAL_BASED_OUTPATIENT_CLINIC_OR_DEPARTMENT_OTHER): Payer: Medicare Other

## 2014-05-07 VITALS — BP 105/65 | HR 71 | Temp 97.4°F | Resp 18

## 2014-05-07 DIAGNOSIS — C9200 Acute myeloblastic leukemia, not having achieved remission: Secondary | ICD-10-CM

## 2014-05-07 DIAGNOSIS — D649 Anemia, unspecified: Secondary | ICD-10-CM

## 2014-05-07 DIAGNOSIS — D696 Thrombocytopenia, unspecified: Secondary | ICD-10-CM

## 2014-05-07 LAB — CBC WITH DIFFERENTIAL/PLATELET
BASO%: 0 % (ref 0.0–2.0)
Basophils Absolute: 0 10*3/uL (ref 0.0–0.1)
EOS%: 0 % (ref 0.0–7.0)
Eosinophils Absolute: 0 10*3/uL (ref 0.0–0.5)
HCT: 27.3 % — ABNORMAL LOW (ref 38.4–49.9)
HGB: 9.2 g/dL — ABNORMAL LOW (ref 13.0–17.1)
LYMPH%: 97.7 % — AB (ref 14.0–49.0)
MCH: 32 pg (ref 27.2–33.4)
MCHC: 33.6 g/dL (ref 32.0–36.0)
MCV: 95.2 fL (ref 79.3–98.0)
MONO#: 0 10*3/uL — ABNORMAL LOW (ref 0.1–0.9)
MONO%: 1 % (ref 0.0–14.0)
NEUT#: 0 10*3/uL — CL (ref 1.5–6.5)
NEUT%: 1.3 % — ABNORMAL LOW (ref 39.0–75.0)
PLATELETS: 7 10*3/uL — AB (ref 140–400)
RBC: 2.87 10*6/uL — ABNORMAL LOW (ref 4.20–5.82)
RDW: 17.7 % — ABNORMAL HIGH (ref 11.0–14.6)
WBC: 1.3 10*3/uL — ABNORMAL LOW (ref 4.0–10.3)
lymph#: 1.3 10*3/uL (ref 0.9–3.3)

## 2014-05-07 LAB — HOLD TUBE, BLOOD BANK

## 2014-05-07 MED ORDER — DIPHENHYDRAMINE HCL 25 MG PO CAPS
25.0000 mg | ORAL_CAPSULE | Freq: Once | ORAL | Status: AC
  Administered 2014-05-07: 25 mg via ORAL

## 2014-05-07 MED ORDER — ACETAMINOPHEN 325 MG PO TABS
650.0000 mg | ORAL_TABLET | Freq: Once | ORAL | Status: AC
  Administered 2014-05-07: 650 mg via ORAL

## 2014-05-07 MED ORDER — ACETAMINOPHEN 325 MG PO TABS
ORAL_TABLET | ORAL | Status: AC
  Filled 2014-05-07: qty 2

## 2014-05-07 MED ORDER — DIPHENHYDRAMINE HCL 25 MG PO CAPS
ORAL_CAPSULE | ORAL | Status: AC
  Filled 2014-05-07: qty 1

## 2014-05-07 NOTE — Patient Instructions (Signed)
Blood Transfusion  A blood transfusion replaces your blood or some of its parts. Blood is replaced when you have lost blood because of surgery, an accident, or for severe blood conditions like anemia. You can donate blood to be used on yourself if you have a planned surgery. If you lose blood during that surgery, your own blood can be given back to you. Any blood given to you is checked to make sure it matches your blood type. Your temperature, blood pressure, and heart rate (vital signs) will be checked often.  GET HELP RIGHT AWAY IF:   You feel sick to your stomach (nauseous) or throw up (vomit).  You have watery poop (diarrhea).  You have shortness of breath or trouble breathing.  You have blood in your pee (urine) or have dark colored pee.  You have chest pain or tightness.  Your eyes or skin turn yellow (jaundice).  You have a temperature by mouth above 102 F (38.9 C), not controlled by medicine.  You start to shake and have chills.  You develop a a red rash (hives) or feel itchy.  You develop lightheadedness or feel confused.  You develop back, joint, or muscle pain.  You do not feel hungry (lost appetite).  You feel tired, restless, or nervous.  You develop belly (abdominal) cramps. Document Released: 03/09/2009 Document Revised: 03/04/2012 Document Reviewed: 03/09/2009 [employer] Patient Information 2014 [employer].  

## 2014-05-08 LAB — PREPARE PLATELET PHERESIS: Unit division: 0

## 2014-05-11 ENCOUNTER — Other Ambulatory Visit: Payer: Self-pay | Admitting: *Deleted

## 2014-05-11 ENCOUNTER — Ambulatory Visit (HOSPITAL_BASED_OUTPATIENT_CLINIC_OR_DEPARTMENT_OTHER): Payer: Medicare Other

## 2014-05-11 ENCOUNTER — Other Ambulatory Visit: Payer: Self-pay | Admitting: Hematology and Oncology

## 2014-05-11 ENCOUNTER — Other Ambulatory Visit (HOSPITAL_BASED_OUTPATIENT_CLINIC_OR_DEPARTMENT_OTHER): Payer: Medicare Other

## 2014-05-11 VITALS — BP 100/57 | HR 69 | Temp 97.7°F | Resp 18

## 2014-05-11 DIAGNOSIS — D696 Thrombocytopenia, unspecified: Secondary | ICD-10-CM

## 2014-05-11 DIAGNOSIS — C9200 Acute myeloblastic leukemia, not having achieved remission: Secondary | ICD-10-CM

## 2014-05-11 LAB — CBC WITH DIFFERENTIAL/PLATELET
BASO%: 0 % (ref 0.0–2.0)
Basophils Absolute: 0 10*3/uL (ref 0.0–0.1)
EOS%: 0 % (ref 0.0–7.0)
Eosinophils Absolute: 0 10*3/uL (ref 0.0–0.5)
HCT: 25 % — ABNORMAL LOW (ref 38.4–49.9)
HGB: 8.3 g/dL — ABNORMAL LOW (ref 13.0–17.1)
LYMPH%: 93.9 % — ABNORMAL HIGH (ref 14.0–49.0)
MCH: 31.1 pg (ref 27.2–33.4)
MCHC: 33.2 g/dL (ref 32.0–36.0)
MCV: 93.6 fL (ref 79.3–98.0)
MONO#: 0 10*3/uL — ABNORMAL LOW (ref 0.1–0.9)
MONO%: 1.4 % (ref 0.0–14.0)
NEUT#: 0.1 10*3/uL — CL (ref 1.5–6.5)
NEUT%: 4.7 % — ABNORMAL LOW (ref 39.0–75.0)
Platelets: 10 10*3/uL — CL (ref 140–400)
RBC: 2.67 10*6/uL — ABNORMAL LOW (ref 4.20–5.82)
RDW: 16.9 % — ABNORMAL HIGH (ref 11.0–14.6)
WBC: 1.5 10*3/uL — ABNORMAL LOW (ref 4.0–10.3)
lymph#: 1.4 10*3/uL (ref 0.9–3.3)
nRBC: 0 % (ref 0–0)

## 2014-05-11 LAB — TECHNOLOGIST REVIEW: Technologist Review: 3

## 2014-05-11 LAB — HOLD TUBE, BLOOD BANK

## 2014-05-11 MED ORDER — METHYLPREDNISOLONE SODIUM SUCC 40 MG IJ SOLR
INTRAMUSCULAR | Status: AC
  Filled 2014-05-11: qty 1

## 2014-05-11 MED ORDER — ACETAMINOPHEN 325 MG PO TABS
650.0000 mg | ORAL_TABLET | Freq: Once | ORAL | Status: AC
  Administered 2014-05-11: 650 mg via ORAL

## 2014-05-11 MED ORDER — DIPHENHYDRAMINE HCL 25 MG PO CAPS
ORAL_CAPSULE | ORAL | Status: AC
  Filled 2014-05-11: qty 1

## 2014-05-11 MED ORDER — SODIUM CHLORIDE 0.9 % IJ SOLN
3.0000 mL | INTRAMUSCULAR | Status: AC | PRN
  Administered 2014-05-11: 3 mL
  Filled 2014-05-11: qty 10

## 2014-05-11 MED ORDER — DIPHENHYDRAMINE HCL 25 MG PO CAPS
25.0000 mg | ORAL_CAPSULE | Freq: Once | ORAL | Status: AC
  Administered 2014-05-11: 25 mg via ORAL

## 2014-05-11 MED ORDER — HEPARIN SOD (PORK) LOCK FLUSH 100 UNIT/ML IV SOLN
500.0000 [IU] | Freq: Once | INTRAVENOUS | Status: AC
  Administered 2014-05-11: 500 [IU] via INTRAVENOUS
  Filled 2014-05-11: qty 5

## 2014-05-11 MED ORDER — ACETAMINOPHEN 325 MG PO TABS
ORAL_TABLET | ORAL | Status: AC
  Filled 2014-05-11: qty 2

## 2014-05-11 MED ORDER — METHYLPREDNISOLONE SODIUM SUCC 40 MG IJ SOLR
40.0000 mg | Freq: Once | INTRAMUSCULAR | Status: AC
  Administered 2014-05-11: 40 mg via INTRAVENOUS

## 2014-05-11 NOTE — Patient Instructions (Signed)
Platelet Transfusion Information This is information about transfusions of platelets. Platelets are tiny cells made by the bone marrow and found in the blood. When a blood vessel is damaged platelets rush to the damaged area to help form a clot. This begins the healing process. When platelets get very low your blood may have trouble clotting. This may be from:  Illness.  Blood disorder.  Chemotherapy to treat cancer. Often lower platelet counts do not usually cause problems.  Platelets usually last for 7 to 10 days. If they are not used not used in an injury, they are broken down by the liver or spleen. Symptoms of low platelet count include:  Nosebleeds.  Bleeding gums.  Heavy periods.  Bruising and tiny blood spots in the skin.  Pin point spots of bleeding are called (petechiae).  Larger bruises (purpura).  Bleeding can be more serious if it happens in the brain or bowel. Platelet transfusions are often used to keep the platelet count at an acceptable level. Serious bleeding due to low platelets is uncommon. RISKS AND COMPLICATIONS Severe side effects from platelet transfusions are uncommon. Minor reactions may include:  Itching.  Rashes.  High temperature and shivering. Medications are available to stop transfusion reactions. Let your caregivers know if you develop any of the above problems.  If you are having platelet transfusions frequently they may get less effective. This is called becoming refractory to platelets. It is uncommon. This can happen from non-immune causes and immune causes. Non-immune causes include:  High temperatures.  Some medications.  An enlarged spleen. Immune causes happen when your body discovers the platelets are not your own and begin making antibodies against them. The antibodies kill the platelets quickly. Even with platelet transfusions you may still notice problems with bleeding or bruising. Let your caregivers know about this. Other things  can be done to help if this happens.  BEFORE THE PROCEDURE   Your doctors will check your platelet count regularly.  If the platelet count is too low it may be necessary to have a platelet transfusion.  This is more important before certain procedures with a risk of bleeding such as a spinal tap.  Platelet transfusion reduces the risk of bleeding during or after the procedure.  Except in emergencies, giving a transfusion requires a written consent. Before blood is taken from a donor, a complete history is taken to make sure the person has no history of previous diseases, nor engages in risky social behavior. Examples of this are intravenous drug use or sexual activity with multiple partners. This could lead to infected blood or blood products being used. This history is done even in spite of the extensive testing to make sure the blood is safe. All blood products transfused are tested to make sure it is a match for the person getting the blood. It is also checked for infections. Blood is the safest it has ever been. The risk of getting an infection is very low. PROCEDURE  The platelets are stored in small plastic bags which are kept at a low temperature.  Each bag is called a unit and sometimes two units are given. They are given through an intravenous line by drip infusion over about one half hour.  Usually blood is collected from multiple people to get enough to transfuse.  Sometimes, the platelets are collected from a single person. This is done using a special machine that separates the platelets from the blood. The machine is called an apheresis machine. Platelets collected   in this way are called apheresed platelets. Apheresed platelets reduce the risk of becoming sensitive to the platelets. This lowers the chances of having a transfusion reaction.  As it only takes a short time to give the platelets, this treatment can be given in an outpatients department. Platelets can also be given  before or after other treatments. SEEK IMMEDIATE MEDICAL CARE IF: Any of the following symptoms over the next 12 hours or several days:  Shaking chills.  Fever with a temperature greater than 102 F (38.9 C) develops.  Back pain or muscle pain.  People around you feel you are not acting correctly, or you are confused.  Blood in the urine or bowel movements or bleeding from any place in your body.  Shortness of breath, or difficulty breathing.  Dizziness.  Fainting.  You break out in a rash or develop hives.  You have a decrease in the amount of urine you are putting out, or the urine turns a dark color or changes to pink, red, or [place].  A severe headache or stiff neck.  Bruising more easily. Document Released: 10/08/2007 Document Revised: 03/04/2012 Document Reviewed: 10/08/2007 [employer] Patient Information 2014 [employer].  

## 2014-05-12 ENCOUNTER — Telehealth: Payer: Self-pay | Admitting: *Deleted

## 2014-05-12 ENCOUNTER — Telehealth: Payer: Self-pay | Admitting: Hematology and Oncology

## 2014-05-12 LAB — PREPARE PLATELET PHERESIS: Unit division: 0

## 2014-05-12 NOTE — Telephone Encounter (Signed)
returned pt call and advised on May appts....pt ok and aware

## 2014-05-12 NOTE — Telephone Encounter (Signed)
Pt left VM asking about his BMBx on 5/21.  Called pt back and left him VM informing no need to fast and can drive himself if he feels up to his.  His BMBX is scheduled at [facility] and is non sedated.   Asked him to call back if any further questions.

## 2014-05-13 ENCOUNTER — Other Ambulatory Visit: Payer: Medicare Other

## 2014-05-14 ENCOUNTER — Other Ambulatory Visit (HOSPITAL_COMMUNITY)
Admission: RE | Admit: 2014-05-14 | Discharge: 2014-05-14 | Disposition: A | Discharge status: Home / Self Care | Payer: Medicare Other | Source: Ambulatory Visit | Attending: Hematology and Oncology | Admitting: Hematology and Oncology

## 2014-05-14 ENCOUNTER — Other Ambulatory Visit: Payer: Medicare Other

## 2014-05-14 ENCOUNTER — Ambulatory Visit: Payer: Medicare Other

## 2014-05-14 ENCOUNTER — Other Ambulatory Visit (HOSPITAL_BASED_OUTPATIENT_CLINIC_OR_DEPARTMENT_OTHER): Payer: Medicare Other

## 2014-05-14 DIAGNOSIS — C9200 Acute myeloblastic leukemia, not having achieved remission: Secondary | ICD-10-CM | POA: Diagnosis present

## 2014-05-14 LAB — CBC WITH DIFFERENTIAL/PLATELET
BASO%: 0 % (ref 0.0–2.0)
Basophils Absolute: 0 10*3/uL (ref 0.0–0.1)
EOS%: 0 % (ref 0.0–7.0)
Eosinophils Absolute: 0 10*3/uL (ref 0.0–0.5)
HEMATOCRIT: 25 % — AB (ref 38.4–49.9)
HGB: 8.2 g/dL — ABNORMAL LOW (ref 13.0–17.1)
LYMPH%: 88.1 % — AB (ref 14.0–49.0)
MCH: 31.1 pg (ref 27.2–33.4)
MCHC: 32.8 g/dL (ref 32.0–36.0)
MCV: 94.7 fL (ref 79.3–98.0)
MONO#: 0 10*3/uL — AB (ref 0.1–0.9)
MONO%: 2.8 % (ref 0.0–14.0)
NEUT#: 0.1 10*3/uL — CL (ref 1.5–6.5)
NEUT%: 9.1 % — AB (ref 39.0–75.0)
PLATELETS: 26 10*3/uL — AB (ref 140–400)
RBC: 2.64 10*6/uL — ABNORMAL LOW (ref 4.20–5.82)
RDW: 17.5 % — ABNORMAL HIGH (ref 11.0–14.6)
WBC: 1.1 10*3/uL — AB (ref 4.0–10.3)
lymph#: 1 10*3/uL (ref 0.9–3.3)
nRBC: 2 % — ABNORMAL HIGH (ref 0–0)

## 2014-05-14 LAB — BONE MARROW EXAM

## 2014-05-14 LAB — HOLD TUBE, BLOOD BANK

## 2014-05-14 LAB — TECHNOLOGIST REVIEW

## 2014-05-14 NOTE — Patient Instructions (Signed)
[facility] Discharge Instructions for Post Bone Marrow Procedure  Today you had a bone marrow biopsy and aspirate of right hip.  Please keep the pressure dressing in place for at least 24 hours.  Have someone check your dressing periodically for bleeding.  If needed you can reapply a pressure dressing to the site.    IF BLEEDING REOCCURS THAT SHOULD BE REPORTED IMMEDIATELY. Call the [facility] at [phone] if during business hours. Or report to the Emergency Room.   I have been informed and understand all the instructions given to me. I know to contact the clinic, my physician, or go to the Emergency Department if any problems should occur. I do not have any questions at this time, but understand that I may call the clinic during office hours at [phone]  should I have any questions or need assistance in obtaining follow up care.    __________________________________________  _____________  __________ Signature of Patient or Authorized Representative            Date                   Time    __________________________________________ Nurse's Signature

## 2014-05-14 NOTE — Progress Notes (Unsigned)
Dressing to bone marrow site dry and intact.

## 2014-05-14 NOTE — Procedures (Signed)
Bone Marrow Biopsy and Aspiration Procedure Note   Informed consent was obtained and potential risks including bleeding, infection and pain were reviewed with the patient.  The patient's name, date of birth, identification, consent and allergies were verified prior to the start of procedure and time out was performed.  The right posterior iliac crest was chosen as the site of biopsy.  The skin was prepped with Betadine solution.   8 cc of 1% lidocaine was used to provide local anaesthesia.   10 cc of bone marrow aspirate was obtained followed by 1 inch biopsy.   The procedure was tolerated well and there were no complications.  The patient was stable at the end of the procedure.  Specimens sent for flow cytometry, cytogenetics and additional studies.  

## 2014-05-19 ENCOUNTER — Ambulatory Visit (HOSPITAL_BASED_OUTPATIENT_CLINIC_OR_DEPARTMENT_OTHER): Payer: Medicare Other

## 2014-05-19 ENCOUNTER — Telehealth: Payer: Self-pay | Admitting: Hematology and Oncology

## 2014-05-19 ENCOUNTER — Other Ambulatory Visit (HOSPITAL_BASED_OUTPATIENT_CLINIC_OR_DEPARTMENT_OTHER): Payer: Medicare Other

## 2014-05-19 ENCOUNTER — Ambulatory Visit (HOSPITAL_BASED_OUTPATIENT_CLINIC_OR_DEPARTMENT_OTHER): Payer: Medicare Other | Admitting: Hematology and Oncology

## 2014-05-19 ENCOUNTER — Encounter: Payer: Self-pay | Admitting: Hematology and Oncology

## 2014-05-19 VITALS — BP 119/51 | HR 79 | Temp 98.0°F | Resp 18 | Ht 68.0 in | Wt 181.6 lb

## 2014-05-19 VITALS — BP 114/58 | HR 70 | Temp 98.5°F | Resp 18

## 2014-05-19 DIAGNOSIS — C9200 Acute myeloblastic leukemia, not having achieved remission: Secondary | ICD-10-CM

## 2014-05-19 DIAGNOSIS — D72819 Decreased white blood cell count, unspecified: Secondary | ICD-10-CM

## 2014-05-19 DIAGNOSIS — D649 Anemia, unspecified: Secondary | ICD-10-CM

## 2014-05-19 DIAGNOSIS — D696 Thrombocytopenia, unspecified: Secondary | ICD-10-CM

## 2014-05-19 DIAGNOSIS — N189 Chronic kidney disease, unspecified: Secondary | ICD-10-CM

## 2014-05-19 LAB — CBC WITH DIFFERENTIAL/PLATELET
BASO%: 0 % (ref 0.0–2.0)
Basophils Absolute: 0 10*3/uL (ref 0.0–0.1)
EOS ABS: 0 10*3/uL (ref 0.0–0.5)
EOS%: 0 % (ref 0.0–7.0)
HCT: 21.8 % — ABNORMAL LOW (ref 38.4–49.9)
HGB: 7.1 g/dL — ABNORMAL LOW (ref 13.0–17.1)
LYMPH%: 83.5 % — ABNORMAL HIGH (ref 14.0–49.0)
MCH: 31.1 pg (ref 27.2–33.4)
MCHC: 32.6 g/dL (ref 32.0–36.0)
MCV: 95.6 fL (ref 79.3–98.0)
MONO#: 0.1 10*3/uL (ref 0.1–0.9)
MONO%: 6.6 % (ref 0.0–14.0)
NEUT%: 9.9 % — ABNORMAL LOW (ref 39.0–75.0)
NEUTROS ABS: 0.1 10*3/uL — AB (ref 1.5–6.5)
NRBC: 2 % — AB (ref 0–0)
Platelets: 8 10*3/uL — CL (ref 140–400)
RBC: 2.28 10*6/uL — AB (ref 4.20–5.82)
RDW: 17.7 % — AB (ref 11.0–14.6)
WBC: 0.9 10*3/uL — CL (ref 4.0–10.3)
lymph#: 0.8 10*3/uL — ABNORMAL LOW (ref 0.9–3.3)

## 2014-05-19 LAB — TECHNOLOGIST REVIEW: Technologist Review: 2

## 2014-05-19 LAB — HOLD TUBE, BLOOD BANK

## 2014-05-19 MED ORDER — ACETAMINOPHEN 325 MG PO TABS
650.0000 mg | ORAL_TABLET | Freq: Once | ORAL | Status: AC
  Administered 2014-05-19: 650 mg via ORAL

## 2014-05-19 MED ORDER — SODIUM CHLORIDE 0.9 % IJ SOLN
10.0000 mL | INTRAMUSCULAR | Status: DC | PRN
  Administered 2014-05-19: 10 mL via INTRAVENOUS
  Filled 2014-05-19: qty 10

## 2014-05-19 MED ORDER — SODIUM CHLORIDE 0.9 % IJ SOLN
3.0000 mL | INTRAMUSCULAR | Status: DC | PRN
  Filled 2014-05-19: qty 10

## 2014-05-19 MED ORDER — ACETAMINOPHEN 325 MG PO TABS
ORAL_TABLET | ORAL | Status: AC
  Filled 2014-05-19: qty 2

## 2014-05-19 MED ORDER — DIPHENHYDRAMINE HCL 25 MG PO CAPS
ORAL_CAPSULE | ORAL | Status: AC
  Filled 2014-05-19: qty 1

## 2014-05-19 MED ORDER — DIPHENHYDRAMINE HCL 25 MG PO CAPS
25.0000 mg | ORAL_CAPSULE | Freq: Once | ORAL | Status: AC
  Administered 2014-05-19: 25 mg via ORAL

## 2014-05-19 MED ORDER — METHYLPREDNISOLONE SODIUM SUCC 40 MG IJ SOLR
40.0000 mg | Freq: Once | INTRAMUSCULAR | Status: AC
  Administered 2014-05-19: 40 mg via INTRAVENOUS

## 2014-05-19 MED ORDER — HEPARIN SOD (PORK) LOCK FLUSH 100 UNIT/ML IV SOLN
500.0000 [IU] | Freq: Once | INTRAVENOUS | Status: AC
  Administered 2014-05-19: 500 [IU] via INTRAVENOUS
  Filled 2014-05-19: qty 5

## 2014-05-19 MED ORDER — METHYLPREDNISOLONE SODIUM SUCC 40 MG IJ SOLR
INTRAMUSCULAR | Status: AC
  Filled 2014-05-19: qty 1

## 2014-05-19 NOTE — Progress Notes (Signed)
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 Since then, he had multiple bone marrow aspirate and biopsy. His initial bone marrow biopsy in October 2012 showed 20% blast, suspicious for AML. Due to his age, he was offered Vidaza. In May of 2014, repeat bone marrow aspirate and biopsy show his blast count has reduced to about 9%. In November 2014, repeat bone marrow aspirate and biopsy confirmed complete remission. Between November 2014 to February 2015, he had multiple dosage adjustment to his treatment due to pancytopenia. On 02/23/2014, repeat bone marrow aspirate and biopsy showed progressive leukemia. The patient went to [facility] again for a second opinion and subsequently return here for palliative treatment. On 03/09/2014, we restarted him on Vidaza at 100 mg/m2, 7 days on, out of cycle of 35 days On 04/13/2014, his treatment is switched to intravenous form On 05/14/2014, repeat bone marrow aspirate and biopsy showed persistent disease with mild progression INTERVAL HISTORY: He feels well. Complained of mild fatigue. He has some bruising but no bleeding. He denies any recent fever, chills, night sweats or abnormal weight loss  I have reviewed the past medical history, past surgical history, social history and family history with the patient and they are unchanged from previous note.  ALLERGIES:  has No Known Allergies.  MEDICATIONS:  Current Outpatient Prescriptions  Medication Sig Dispense Refill  . acyclovir (ZOVIRAX) 400 MG tablet Take 1 tablet (400 mg total) by mouth daily.  30  tablet  6  . alendronate (FOSAMAX) 70 MG tablet Take 70 mg by mouth every Monday. Take with a full glass of water on an empty stomach.      Marland Kitchen amLODipine (NORVASC) 5 MG tablet Take 1 tablet (5 mg total) by mouth daily before breakfast.  90 tablet  0  . amoxicillin-clavulanate (AUGMENTIN) 875-125 MG per tablet Take 1 tablet by mouth 2 (two) times daily. For tooth abcess  14 tablet  1  . Calcium Carbonate-Vitamin D (CALCIUM 600 + D PO) Take 1 tablet by mouth 2 (two) times daily.      . finasteride (PROSCAR) 5 MG tablet Take 5 mg by mouth daily.      Marland Kitchen levothyroxine (SYNTHROID, LEVOTHROID) 50 MCG tablet Take 50 mcg by mouth every evening.       . lidocaine-prilocaine (EMLA) cream Apply 1 application topically as needed. Apply to [facility] a cath site as needle prior to needle stick.  30 g  2  . lisinopril (PRINIVIL,ZESTRIL) 10 MG tablet Take 10 mg by mouth every evening.       Marland Kitchen LORazepam (ATIVAN) 0.5 MG tablet Take 0.5 mg by mouth at bedtime as needed for anxiety (sleep).       . metoprolol succinate (TOPROL-XL) 100 MG 24 hr tablet Take 1 tablet (100 mg total) by mouth daily before breakfast. Take with or immediately following a meal.  90 tablet  3  . Multiple Vitamin (MULTIVITAMIN WITH MINERALS) TABS Take 1 tablet by mouth daily.      . simvastatin (ZOCOR) 20 MG tablet Take 20 mg by mouth at bedtime.        Marland Kitchen terazosin (HYTRIN) 10  MG capsule Take 10 mg by mouth at bedtime.      . vitamin B-12 (CYANOCOBALAMIN) 1000 MCG tablet Take 1,000 mcg by mouth daily.      . vitamin C (ASCORBIC ACID) 500 MG tablet Take 500 mg by mouth daily.       No current facility-administered medications for this visit.   Facility-Administered Medications Ordered in Other Visits  Medication Dose Route Frequency Provider Last Rate Last Dose  . sodium chloride 0.9 % injection 3 mL  3 mL Intracatheter PRN Heath Lark, MD        REVIEW OF SYSTEMS:   All other systems were reviewed with the patient and are negative.  PHYSICAL  EXAMINATION: ECOG PERFORMANCE STATUS: 1 - Symptomatic but completely ambulatory  Filed Vitals:   05/19/14 1059  BP: 119/51  Pulse: 79  Temp: 98 F (36.7 C)  Resp: 18   Filed Weights   05/19/14 1059  Weight: 181 lb 9.6 oz (82.373 kg)    GENERAL:alert, no distress and comfortable. He looks pale SKIN: skin color, texture, turgor are normal, no rashes or significant lesions with some extensive bruising. No petechiae. EYES: normal, Conjunctiva are pale and non-injected, sclera clear Musculoskeletal:no cyanosis of digits and no clubbing  NEURO: alert & oriented x 3 with fluent speech, no focal motor/sensory deficits  LABORATORY DATA:  I have reviewed the data as listed    Component Value Date/Time   NA 145 04/13/2014 1034   NA 140 11/04/2012 1201   K 4.2 04/13/2014 1034   K 4.0 11/04/2012 1201   CL 111* 06/18/2013 0854   CL 107 11/04/2012 1201   CO2 25 04/13/2014 1034   CO2 26 11/04/2012 1201   GLUCOSE 77 04/13/2014 1034   GLUCOSE 117* 06/18/2013 0854   GLUCOSE 85 11/04/2012 1201   BUN 35.9* 04/13/2014 1034   BUN 26* 11/04/2012 1201   CREATININE 1.7* 04/13/2014 1034   CREATININE 1.56* 11/04/2012 1201   CALCIUM 9.9 04/13/2014 1034   CALCIUM 9.9 11/04/2012 1201   PROT 6.4 04/13/2014 1034   ALBUMIN 3.5 04/13/2014 1034   AST 17 04/13/2014 1034   ALT 14 04/13/2014 1034   ALKPHOS 57 04/13/2014 1034   BILITOT 0.67 04/13/2014 1034   GFRNONAA 39* 11/04/2012 1201   GFRAA 46* 11/04/2012 1201    No results found for this basename: SPEP,  UPEP,   kappa and lambda light chains    Lab Results  Component Value Date   WBC 0.9* 05/19/2014   NEUTROABS 0.1* 05/19/2014   HGB 7.1* 05/19/2014   HCT 21.8* 05/19/2014   MCV 95.6 05/19/2014   PLT 8* 05/19/2014      Chemistry      Component Value Date/Time   NA 145 04/13/2014 1034   NA 140 11/04/2012 1201   K 4.2 04/13/2014 1034   K 4.0 11/04/2012 1201   CL 111* 06/18/2013 0854   CL 107 11/04/2012 1201   CO2 25 04/13/2014 1034   CO2 26 11/04/2012  1201   BUN 35.9* 04/13/2014 1034   BUN 26* 11/04/2012 1201   CREATININE 1.7* 04/13/2014 1034   CREATININE 1.56* 11/04/2012 1201      Component Value Date/Time   CALCIUM 9.9 04/13/2014 1034   CALCIUM 9.9 11/04/2012 1201   ALKPHOS 57 04/13/2014 1034   AST 17 04/13/2014 1034   ALT 14 04/13/2014 1034   BILITOT 0.67 04/13/2014 1034     #1 AML His prognosis is very poor. The patient has complex cytogenetics. The  appointment his son as the medical power of attorney. The patient clarified that his status is DO NOT RESUSCITATE/DO NOT INTUBATE. Unfortunately, bone marrow biopsy showed no response to treatment. We will discontinue treatment. I discussed with the patient the importance of supportive care and palliative care. After extensive discussion with the patient and his son, the patient does not appear to be ready for hospice. In the meantime, we will continue transfusion support. #2 Anemia #3 Severe Leukopenia #4 Severe Thrombocytopenia His abnormal blood count is related to underlying disease. I will proceed to transfuse 2 units of blood today. He will also continue platelet transfusion support. I recommend him to take acyclovir as antimicrobial prophylaxis. The patient will hold taking ciprofloxacin unless he had signs and symptoms of infection. The patient is recommended to continue holding off taking aspirin. He will continue to have blood work checked every Mondays and Thursdays. #5 chronic kidney disease His kidney function is stable. We will monitored carefully.     All questions were answered. The patient knows to call the clinic with any problems, questions or concerns. No barriers to learning was detected. I spent 25 minutes counseling the patient face to face. The total time spent in the appointment was 30 minutes and more than 50% was on counseling and review of test results     Heath Lark, MD 05/19/2014 1:56 PM

## 2014-05-19 NOTE — Telephone Encounter (Signed)
gv pt appt schedule for may/june. added lab for 5/29 and 6/1. per 5/26 pof no return visit at this time.

## 2014-05-19 NOTE — Patient Instructions (Signed)
Blood Transfusion Information WHAT IS A BLOOD TRANSFUSION? A transfusion is the replacement of blood or some of its parts. Blood is made up of multiple cells which provide different functions.  Red blood cells carry oxygen and are used for blood loss replacement.  White blood cells fight against infection.  Platelets control bleeding.  Plasma helps clot blood.  Other blood products are available for specialized needs, such as hemophilia or other clotting disorders. BEFORE THE TRANSFUSION  Who gives blood for transfusions?   You may be able to donate blood to be used at a later date on yourself (autologous donation).  Relatives can be asked to donate blood. This is generally not any safer than if you have received blood from a stranger. The same precautions are taken to ensure safety when a relative's blood is donated.  Healthy volunteers who are fully evaluated to make sure their blood is safe. This is blood bank blood. Transfusion therapy is the safest it has ever been in the practice of medicine. Before blood is taken from a donor, a complete history is taken to make sure that person has no history of diseases nor engages in risky social behavior (examples are intravenous drug use or sexual activity with multiple partners). The donor's travel history is screened to minimize risk of transmitting infections, such as malaria. The donated blood is tested for signs of infectious diseases, such as HIV and hepatitis. The blood is then tested to be sure it is compatible with you in order to minimize the chance of a transfusion reaction. If you or a relative donates blood, this is often done in anticipation of surgery and is not appropriate for emergency situations. It takes many days to process the donated blood. RISKS AND COMPLICATIONS Although transfusion therapy is very safe and saves many lives, the main dangers of transfusion include:   Getting an infectious disease.  Developing a  transfusion reaction. This is an allergic reaction to something in the blood you were given. Every precaution is taken to prevent this. The decision to have a blood transfusion has been considered carefully by your caregiver before blood is given. Blood is not given unless the benefits outweigh the risks. AFTER THE TRANSFUSION  Right after receiving a blood transfusion, you will usually feel much better and more energetic. This is especially true if your red blood cells have gotten low (anemic). The transfusion raises the level of the red blood cells which carry oxygen, and this usually causes an energy increase.  The nurse administering the transfusion will monitor you carefully for complications. HOME CARE INSTRUCTIONS  No special instructions are needed after a transfusion. You may find your energy is better. Speak with your caregiver about any limitations on activity for underlying diseases you may have. SEEK MEDICAL CARE IF:   Your condition is not improving after your transfusion.  You develop redness or irritation at the intravenous (IV) site. SEEK IMMEDIATE MEDICAL CARE IF:  Any of the following symptoms occur over the next 12 hours:  Shaking chills.  You have a temperature by mouth above 102 F (38.9 C), not controlled by medicine.  Chest, back, or muscle pain.  People around you feel you are not acting correctly or are confused.  Shortness of breath or difficulty breathing.  Dizziness and fainting.  You get a rash or develop hives.  You have a decrease in urine output.  Your urine turns a dark color or changes to pink, red, or [place]. Any of the following   symptoms occur over the next 10 days:  You have a temperature by mouth above 102 F (38.9 C), not controlled by medicine.  Shortness of breath.  Weakness after normal activity.  The white part of the eye turns yellow (jaundice).  You have a decrease in the amount of urine or are urinating less often.  Your  urine turns a dark color or changes to pink, red, or [place]. Document Released: 12/08/2000 Document Revised: 03/04/2012 Document Reviewed: 07/27/2008 Exit[employer] Patient Information 2014 [employer].  Platelet Transfusion Information This is information about transfusions of platelets. Platelets are tiny cells made by the bone marrow and found in the blood. When a blood vessel is damaged platelets rush to the damaged area to help form a clot. This begins the healing process. When platelets get very low your blood may have trouble clotting. This may be from:  Illness.  Blood disorder.  Chemotherapy to treat cancer. Often lower platelet counts do not usually cause problems.  Platelets usually last for 7 to 10 days. If they are not used not used in an injury, they are broken down by the liver or spleen. Symptoms of low platelet count include:  Nosebleeds.  Bleeding gums.  Heavy periods.  Bruising and tiny blood spots in the skin.  Pin point spots of bleeding are called (petechiae).  Larger bruises (purpura).  Bleeding can be more serious if it happens in the brain or bowel. Platelet transfusions are often used to keep the platelet count at an acceptable level. Serious bleeding due to low platelets is uncommon. RISKS AND COMPLICATIONS Severe side effects from platelet transfusions are uncommon. Minor reactions may include:  Itching.  Rashes.  High temperature and shivering. Medications are available to stop transfusion reactions. Let your caregivers know if you develop any of the above problems.  If you are having platelet transfusions frequently they may get less effective. This is called becoming refractory to platelets. It is uncommon. This can happen from non-immune causes and immune causes. Non-immune causes include:  High temperatures.  Some medications.  An enlarged spleen. Immune causes happen when your body discovers the platelets are not your own and begin making  antibodies against them. The antibodies kill the platelets quickly. Even with platelet transfusions you may still notice problems with bleeding or bruising. Let your caregivers know about this. Other things can be done to help if this happens.  BEFORE THE PROCEDURE   Your doctors will check your platelet count regularly.  If the platelet count is too low it may be necessary to have a platelet transfusion.  This is more important before certain procedures with a risk of bleeding such as a spinal tap.  Platelet transfusion reduces the risk of bleeding during or after the procedure.  Except in emergencies, giving a transfusion requires a written consent. Before blood is taken from a donor, a complete history is taken to make sure the person has no history of previous diseases, nor engages in risky social behavior. Examples of this are intravenous drug use or sexual activity with multiple partners. This could lead to infected blood or blood products being used. This history is done even in spite of the extensive testing to make sure the blood is safe. All blood products transfused are tested to make sure it is a match for the person getting the blood. It is also checked for infections. Blood is the safest it has ever been. The risk of getting an infection is very low. PROCEDURE  The   platelets are stored in small plastic bags which are kept at a low temperature.  Each bag is called a unit and sometimes two units are given. They are given through an intravenous line by drip infusion over about one half hour.  Usually blood is collected from multiple people to get enough to transfuse.  Sometimes, the platelets are collected from a single person. This is done using a special machine that separates the platelets from the blood. The machine is called an apheresis machine. Platelets collected in this way are called apheresed platelets. Apheresed platelets reduce the risk of becoming sensitive to the  platelets. This lowers the chances of having a transfusion reaction.  As it only takes a short time to give the platelets, this treatment can be given in an outpatients department. Platelets can also be given before or after other treatments. SEEK IMMEDIATE MEDICAL CARE IF: Any of the following symptoms over the next 12 hours or several days:  Shaking chills.  Fever with a temperature greater than 102 F (38.9 C) develops.  Back pain or muscle pain.  People around you feel you are not acting correctly, or you are confused.  Blood in the urine or bowel movements or bleeding from any place in your body.  Shortness of breath, or difficulty breathing.  Dizziness.  Fainting.  You break out in a rash or develop hives.  You have a decrease in the amount of urine you are putting out, or the urine turns a dark color or changes to pink, red, or [place].  A severe headache or stiff neck.  Bruising more easily. Document Released: 10/08/2007 Document Revised: 03/04/2012 Document Reviewed: 10/08/2007 [facility] Patient Information 2014 [place].

## 2014-05-19 NOTE — Progress Notes (Signed)
Approximately 15 (1415) minutes into PRBC transfusion, pt began experiencing chills.  PRBCs stopped, NS line strung and ran wide opened to patient.  VS stable (see doc flowsheets).  Dr Alvy Bimler notified by CNA,  MD to bedside approximately 1420.  Instructed to administer an additional 25mg  benadryl PO, 650mg  acetaminophen PO, and 40mg  Solumedrol IV.  Administered to patient approximately 1424.   Pt continuing to have chills periodically but stating he "feels better".  Dr Alvy Bimler to return in half hour to decide whether or not to restart PRBC.  Will continue to monitor pt.

## 2014-05-20 ENCOUNTER — Telehealth: Payer: Self-pay | Admitting: Internal Medicine

## 2014-05-20 ENCOUNTER — Other Ambulatory Visit: Payer: Self-pay | Admitting: Hematology and Oncology

## 2014-05-20 ENCOUNTER — Other Ambulatory Visit: Payer: Self-pay | Admitting: Medical Oncology

## 2014-05-20 ENCOUNTER — Other Ambulatory Visit: Payer: Self-pay | Admitting: *Deleted

## 2014-05-20 ENCOUNTER — Ambulatory Visit: Payer: Medicare Other

## 2014-05-20 VITALS — BP 103/64 | HR 79 | Temp 97.7°F | Resp 16

## 2014-05-20 DIAGNOSIS — C9200 Acute myeloblastic leukemia, not having achieved remission: Secondary | ICD-10-CM

## 2014-05-20 LAB — PREPARE PLATELET PHERESIS: Unit division: 0

## 2014-05-20 MED ORDER — ACETAMINOPHEN 325 MG PO TABS
650.0000 mg | ORAL_TABLET | Freq: Once | ORAL | Status: DC
  Administered 2014-05-20: 650 mg via ORAL

## 2014-05-20 MED ORDER — ACETAMINOPHEN 325 MG PO TABS
ORAL_TABLET | ORAL | Status: AC
  Filled 2014-05-20: qty 2

## 2014-05-20 MED ORDER — HEPARIN SOD (PORK) LOCK FLUSH 100 UNIT/ML IV SOLN
500.0000 [IU] | Freq: Every day | INTRAVENOUS | Status: DC | PRN
  Filled 2014-05-20: qty 5

## 2014-05-20 MED ORDER — DIPHENHYDRAMINE HCL 25 MG PO TABS
25.0000 mg | ORAL_TABLET | Freq: Once | ORAL | Status: DC
  Administered 2014-05-20: 25 mg via ORAL
  Filled 2014-05-20: qty 1

## 2014-05-20 MED ORDER — DIPHENHYDRAMINE HCL 25 MG PO CAPS
ORAL_CAPSULE | ORAL | Status: AC
  Filled 2014-05-20: qty 1

## 2014-05-20 MED ORDER — SODIUM CHLORIDE 0.9 % IJ SOLN
10.0000 mL | INTRAMUSCULAR | Status: DC | PRN
  Filled 2014-05-20: qty 10

## 2014-05-20 NOTE — Patient Instructions (Signed)
Blood Transfusion Information WHAT IS A BLOOD TRANSFUSION? A transfusion is the replacement of blood or some of its parts. Blood is made up of multiple cells which provide different functions.  Red blood cells carry oxygen and are used for blood loss replacement.  White blood cells fight against infection.  Platelets control bleeding.  Plasma helps clot blood.  Other blood products are available for specialized needs, such as hemophilia or other clotting disorders. BEFORE THE TRANSFUSION  Who gives blood for transfusions?   You may be able to donate blood to be used at a later date on yourself (autologous donation).  Relatives can be asked to donate blood. This is generally not any safer than if you have received blood from a stranger. The same precautions are taken to ensure safety when a relative's blood is donated.  Healthy volunteers who are fully evaluated to make sure their blood is safe. This is blood bank blood. Transfusion therapy is the safest it has ever been in the practice of medicine. Before blood is taken from a donor, a complete history is taken to make sure that person has no history of diseases nor engages in risky social behavior (examples are intravenous drug use or sexual activity with multiple partners). The donor's travel history is screened to minimize risk of transmitting infections, such as malaria. The donated blood is tested for signs of infectious diseases, such as HIV and hepatitis. The blood is then tested to be sure it is compatible with you in order to minimize the chance of a transfusion reaction. If you or a relative donates blood, this is often done in anticipation of surgery and is not appropriate for emergency situations. It takes many days to process the donated blood. RISKS AND COMPLICATIONS Although transfusion therapy is very safe and saves many lives, the main dangers of transfusion include:   Getting an infectious disease.  Developing a  transfusion reaction. This is an allergic reaction to something in the blood you were given. Every precaution is taken to prevent this. The decision to have a blood transfusion has been considered carefully by your caregiver before blood is given. Blood is not given unless the benefits outweigh the risks. AFTER THE TRANSFUSION  Right after receiving a blood transfusion, you will usually feel much better and more energetic. This is especially true if your red blood cells have gotten low (anemic). The transfusion raises the level of the red blood cells which carry oxygen, and this usually causes an energy increase.  The nurse administering the transfusion will monitor you carefully for complications. HOME CARE INSTRUCTIONS  No special instructions are needed after a transfusion. You may find your energy is better. Speak with your caregiver about any limitations on activity for underlying diseases you may have. SEEK MEDICAL CARE IF:   Your condition is not improving after your transfusion.  You develop redness or irritation at the intravenous (IV) site. SEEK IMMEDIATE MEDICAL CARE IF:  Any of the following symptoms occur over the next 12 hours:  Shaking chills.  You have a temperature by mouth above 102 F (38.9 C), not controlled by medicine.  Chest, back, or muscle pain.  People around you feel you are not acting correctly or are confused.  Shortness of breath or difficulty breathing.  Dizziness and fainting.  You get a rash or develop hives.  You have a decrease in urine output.  Your urine turns a dark color or changes to pink, red, or [place]. Any of the following   symptoms occur over the next 10 days:  You have a temperature by mouth above 102 F (38.9 C), not controlled by medicine.  Shortness of breath.  Weakness after normal activity.  The white part of the eye turns yellow (jaundice).  You have a decrease in the amount of urine or are urinating less often.  Your  urine turns a dark color or changes to pink, red, or [place]. Document Released: 12/08/2000 Document Revised: 03/04/2012 Document Reviewed: 07/27/2008 Exit[employer] Patient Information 2014 [employer].  

## 2014-05-20 NOTE — Telephone Encounter (Signed)
lmonvm advising the pt of his lab appt this Friday and to pick up the rest of his appt schedules for the month of June.

## 2014-05-21 ENCOUNTER — Other Ambulatory Visit: Payer: Medicare Other

## 2014-05-21 ENCOUNTER — Ambulatory Visit: Payer: Medicare Other

## 2014-05-21 LAB — TYPE AND SCREEN
ABO/RH(D): O POS
ANTIBODY SCREEN: NEGATIVE
UNIT DIVISION: 0
Unit division: 0

## 2014-05-22 ENCOUNTER — Other Ambulatory Visit: Payer: Self-pay | Admitting: Medical Oncology

## 2014-05-22 ENCOUNTER — Non-Acute Institutional Stay (HOSPITAL_COMMUNITY)
Admission: AD | Admit: 2014-05-22 | Discharge: 2014-05-22 | Disposition: A | Discharge status: Home / Self Care | Payer: Medicare Other | Source: Ambulatory Visit | Attending: Hematology and Oncology | Admitting: Hematology and Oncology

## 2014-05-22 ENCOUNTER — Ambulatory Visit: Payer: Medicare Other

## 2014-05-22 ENCOUNTER — Other Ambulatory Visit: Payer: Self-pay | Admitting: Hematology and Oncology

## 2014-05-22 ENCOUNTER — Ambulatory Visit (HOSPITAL_BASED_OUTPATIENT_CLINIC_OR_DEPARTMENT_OTHER): Payer: Medicare Other | Admitting: Hematology and Oncology

## 2014-05-22 DIAGNOSIS — C9200 Acute myeloblastic leukemia, not having achieved remission: Secondary | ICD-10-CM

## 2014-05-22 DIAGNOSIS — D649 Anemia, unspecified: Secondary | ICD-10-CM

## 2014-05-22 LAB — CBC WITH DIFFERENTIAL/PLATELET
BASO%: 0 % (ref 0.0–2.0)
BASOS ABS: 0 10*3/uL (ref 0.0–0.1)
EOS%: 0 % (ref 0.0–7.0)
Eosinophils Absolute: 0 10*3/uL (ref 0.0–0.5)
HEMATOCRIT: 29 % — AB (ref 38.4–49.9)
HEMOGLOBIN: 9.6 g/dL — AB (ref 13.0–17.1)
LYMPH#: 1 10*3/uL (ref 0.9–3.3)
LYMPH%: 82.4 % — ABNORMAL HIGH (ref 14.0–49.0)
MCH: 30.7 pg (ref 27.2–33.4)
MCHC: 33.1 g/dL (ref 32.0–36.0)
MCV: 92.7 fL (ref 79.3–98.0)
MONO#: 0.1 10*3/uL (ref 0.1–0.9)
MONO%: 8.4 % (ref 0.0–14.0)
NEUT%: 9.2 % — AB (ref 39.0–75.0)
NEUTROS ABS: 0.1 10*3/uL — AB (ref 1.5–6.5)
Platelets: 9 10*3/uL — CL (ref 140–400)
RBC: 3.13 10*6/uL — ABNORMAL LOW (ref 4.20–5.82)
RDW: 18.1 % — ABNORMAL HIGH (ref 11.0–14.6)
WBC: 1.2 10*3/uL — AB (ref 4.0–10.3)
nRBC: 2 % — ABNORMAL HIGH (ref 0–0)

## 2014-05-22 LAB — HOLD TUBE, BLOOD BANK

## 2014-05-22 LAB — TECHNOLOGIST REVIEW: Technologist Review: 10

## 2014-05-22 MED ORDER — HEPARIN SOD (PORK) LOCK FLUSH 100 UNIT/ML IV SOLN
500.0000 [IU] | Freq: Every day | INTRAVENOUS | Status: AC | PRN
  Administered 2014-05-22: 500 [IU]
  Filled 2014-05-22: qty 5

## 2014-05-22 MED ORDER — SODIUM CHLORIDE 0.9 % IJ SOLN
INTRAMUSCULAR | Status: AC
  Filled 2014-05-22: qty 10

## 2014-05-22 MED ORDER — SODIUM CHLORIDE 0.9 % IJ SOLN: 10.0000 mL | INTRAMUSCULAR | Status: DC | PRN

## 2014-05-22 MED ORDER — SODIUM CHLORIDE 0.9 % IJ SOLN: 3.0000 mL | INTRAMUSCULAR | Status: DC | PRN

## 2014-05-22 MED ORDER — DIPHENHYDRAMINE HCL 25 MG PO CAPS
25.0000 mg | ORAL_CAPSULE | Freq: Once | ORAL | Status: AC
  Administered 2014-05-22: 25 mg via ORAL
  Filled 2014-05-22: qty 1

## 2014-05-22 MED ORDER — ACETAMINOPHEN 325 MG PO TABS
650.0000 mg | ORAL_TABLET | Freq: Once | ORAL | Status: AC
  Administered 2014-05-22: 650 mg via ORAL
  Filled 2014-05-22: qty 2

## 2014-05-22 MED ORDER — HEPARIN SOD (PORK) LOCK FLUSH 100 UNIT/ML IV SOLN
250.0000 [IU] | INTRAVENOUS | Status: DC | PRN
  Filled 2014-05-22: qty 5

## 2014-05-22 MED ORDER — SODIUM CHLORIDE 0.9 % IV SOLN
250.0000 mL | Freq: Once | INTRAVENOUS | Status: AC
  Administered 2014-05-22: 250 mL via INTRAVENOUS

## 2014-05-22 NOTE — Progress Notes (Signed)
Per MD, patient to receive 1 unit of Platelets today. Patient informed and will be receiving platelets at the [facility]. Patient gave verbal understanding.  MD placed orders.

## 2014-05-22 NOTE — Procedures (Signed)
[facility]  Procedure Note  KESLER WICKHAM HTD:[id number] DOB: 1927-06-16 DOA: 05/22/2014   PCP: Precious Reel, MD   Associated Diagnosis: AML (acute myologenous leukemia)  Procedure Note: Pt received 1 unit of platetets    Condition During Procedure: Tolerated well   Condition at Discharge: Pt discharged to home; port flushed and deaccessed; no complications noted   Maryelizabeth Rowan, [facility]

## 2014-05-22 NOTE — H&P (Signed)
The patient is here for transfusion only  

## 2014-05-23 LAB — PREPARE PLATELET PHERESIS: Unit division: 0

## 2014-05-24 ENCOUNTER — Other Ambulatory Visit: Payer: Self-pay | Admitting: Hematology and Oncology

## 2014-05-24 DIAGNOSIS — C9200 Acute myeloblastic leukemia, not having achieved remission: Secondary | ICD-10-CM

## 2014-05-25 ENCOUNTER — Non-Acute Institutional Stay (HOSPITAL_COMMUNITY)
Admission: AD | Admit: 2014-05-25 | Discharge: 2014-05-25 | Disposition: A | Discharge status: Home / Self Care | Payer: Medicare Other | Source: Ambulatory Visit | Attending: Hematology and Oncology | Admitting: Hematology and Oncology

## 2014-05-25 ENCOUNTER — Encounter: Payer: Self-pay | Admitting: *Deleted

## 2014-05-25 ENCOUNTER — Other Ambulatory Visit: Payer: Self-pay | Admitting: Hematology and Oncology

## 2014-05-25 ENCOUNTER — Ambulatory Visit: Payer: Medicare Other

## 2014-05-25 ENCOUNTER — Ambulatory Visit (HOSPITAL_BASED_OUTPATIENT_CLINIC_OR_DEPARTMENT_OTHER): Payer: Medicare Other | Admitting: *Deleted

## 2014-05-25 ENCOUNTER — Ambulatory Visit (HOSPITAL_COMMUNITY)
Admission: RE | Admit: 2014-05-25 | Discharge: 2014-05-25 | Disposition: A | Discharge status: Home / Self Care | Payer: Medicare Other | Source: Ambulatory Visit | Attending: Hematology and Oncology | Admitting: Hematology and Oncology

## 2014-05-25 DIAGNOSIS — C9200 Acute myeloblastic leukemia, not having achieved remission: Secondary | ICD-10-CM | POA: Insufficient documentation

## 2014-05-25 DIAGNOSIS — D649 Anemia, unspecified: Secondary | ICD-10-CM | POA: Insufficient documentation

## 2014-05-25 DIAGNOSIS — D696 Thrombocytopenia, unspecified: Secondary | ICD-10-CM

## 2014-05-25 LAB — TECHNOLOGIST REVIEW

## 2014-05-25 LAB — CBC WITH DIFFERENTIAL/PLATELET
BASO%: 0 % (ref 0.0–2.0)
BASOS ABS: 0 10*3/uL (ref 0.0–0.1)
EOS ABS: 0 10*3/uL (ref 0.0–0.5)
EOS%: 0 % (ref 0.0–7.0)
HEMATOCRIT: 26.8 % — AB (ref 38.4–49.9)
HGB: 8.8 g/dL — ABNORMAL LOW (ref 13.0–17.1)
LYMPH%: 83.6 % — ABNORMAL HIGH (ref 14.0–49.0)
MCH: 30.6 pg (ref 27.2–33.4)
MCHC: 32.8 g/dL (ref 32.0–36.0)
MCV: 93.1 fL (ref 79.3–98.0)
MONO#: 0.1 10*3/uL (ref 0.1–0.9)
MONO%: 6.8 % (ref 0.0–14.0)
NEUT%: 9.6 % — AB (ref 39.0–75.0)
NEUTROS ABS: 0.1 10*3/uL — AB (ref 1.5–6.5)
Platelets: 9 10*3/uL — CL (ref 140–400)
RBC: 2.88 10*6/uL — ABNORMAL LOW (ref 4.20–5.82)
RDW: 17.5 % — ABNORMAL HIGH (ref 11.0–14.6)
WBC: 1.5 10*3/uL — AB (ref 4.0–10.3)
lymph#: 1.2 10*3/uL (ref 0.9–3.3)
nRBC: 0 % (ref 0–0)

## 2014-05-25 LAB — HOLD TUBE, BLOOD BANK

## 2014-05-25 LAB — TISSUE HYBRIDIZATION (BONE MARROW)-NCBH

## 2014-05-25 LAB — CHROMOSOME ANALYSIS, BONE MARROW

## 2014-05-25 MED ORDER — SODIUM CHLORIDE 0.9 % IJ SOLN: 3.0000 mL | INTRAMUSCULAR | Status: DC | PRN

## 2014-05-25 MED ORDER — ACETAMINOPHEN 325 MG PO TABS
650.0000 mg | ORAL_TABLET | Freq: Once | ORAL | Status: AC
  Administered 2014-05-25: 650 mg via ORAL
  Filled 2014-05-25: qty 2

## 2014-05-25 MED ORDER — SODIUM CHLORIDE 0.9 % IV SOLN
250.0000 mL | Freq: Once | INTRAVENOUS | Status: AC
  Administered 2014-05-25: 250 mL via INTRAVENOUS

## 2014-05-25 MED ORDER — HEPARIN SOD (PORK) LOCK FLUSH 100 UNIT/ML IV SOLN: 250.0000 [IU] | INTRAVENOUS | Status: DC | PRN

## 2014-05-25 MED ORDER — SODIUM CHLORIDE 0.9 % IJ SOLN
10.0000 mL | INTRAMUSCULAR | Status: AC | PRN
  Administered 2014-05-25: 10 mL

## 2014-05-25 MED ORDER — DIPHENHYDRAMINE HCL 25 MG PO CAPS
25.0000 mg | ORAL_CAPSULE | Freq: Once | ORAL | Status: AC
  Administered 2014-05-25: 25 mg via ORAL
  Filled 2014-05-25: qty 1

## 2014-05-25 MED ORDER — HEPARIN SOD (PORK) LOCK FLUSH 100 UNIT/ML IV SOLN
500.0000 [IU] | Freq: Every day | INTRAVENOUS | Status: AC | PRN
  Administered 2014-05-25: 500 [IU]
  Filled 2014-05-25: qty 5

## 2014-05-25 NOTE — Progress Notes (Signed)
Informed pt of lab results today.  Dr. Alvy Bimler ordered one unit of Platelets and he has appt at [facility] at 2 pm today for transfusion.  Pt verbalized understanding.

## 2014-05-25 NOTE — H&P (Signed)
The patient is here for transfusion only  

## 2014-05-25 NOTE — Procedures (Signed)
[facility]  Procedure Note  Kyle Brewer TGY:[id number] DOB: 04-28-1927 DOA: 05/25/2014   PCP: Precious Reel, MD   Associated Diagnosis: AML (acute myelogenous leukemia)  Procedure Note: 1 unit platelets   Condition During Procedure: Pt tolerated well   Condition at Discharge: Ambulatory; no complications noted   Maryelizabeth Rowan, [facility]

## 2014-05-26 ENCOUNTER — Ambulatory Visit: Payer: Medicare Other

## 2014-05-26 LAB — PREPARE PLATELET PHERESIS: UNIT DIVISION: 0

## 2014-05-27 ENCOUNTER — Ambulatory Visit: Payer: Medicare Other

## 2014-05-28 ENCOUNTER — Ambulatory Visit (HOSPITAL_BASED_OUTPATIENT_CLINIC_OR_DEPARTMENT_OTHER): Payer: Medicare Other

## 2014-05-28 ENCOUNTER — Other Ambulatory Visit (HOSPITAL_BASED_OUTPATIENT_CLINIC_OR_DEPARTMENT_OTHER): Payer: Medicare Other

## 2014-05-28 ENCOUNTER — Other Ambulatory Visit: Payer: Self-pay | Admitting: *Deleted

## 2014-05-28 VITALS — BP 101/57 | HR 70 | Temp 97.3°F | Resp 20

## 2014-05-28 DIAGNOSIS — D696 Thrombocytopenia, unspecified: Secondary | ICD-10-CM

## 2014-05-28 DIAGNOSIS — C9200 Acute myeloblastic leukemia, not having achieved remission: Secondary | ICD-10-CM

## 2014-05-28 LAB — CBC WITH DIFFERENTIAL/PLATELET
BASO%: 0 % (ref 0.0–2.0)
Basophils Absolute: 0 10*3/uL (ref 0.0–0.1)
EOS%: 0 % (ref 0.0–7.0)
Eosinophils Absolute: 0 10*3/uL (ref 0.0–0.5)
HCT: 26 % — ABNORMAL LOW (ref 38.4–49.9)
HGB: 8.5 g/dL — ABNORMAL LOW (ref 13.0–17.1)
LYMPH#: 1 10*3/uL (ref 0.9–3.3)
LYMPH%: 81 % — AB (ref 14.0–49.0)
MCH: 30.6 pg (ref 27.2–33.4)
MCHC: 32.7 g/dL (ref 32.0–36.0)
MCV: 93.5 fL (ref 79.3–98.0)
MONO#: 0.1 10*3/uL (ref 0.1–0.9)
MONO%: 7.1 % (ref 0.0–14.0)
NEUT#: 0.2 10*3/uL — CL (ref 1.5–6.5)
NEUT%: 11.9 % — ABNORMAL LOW (ref 39.0–75.0)
NRBC: 0 % (ref 0–0)
PLATELETS: 7 10*3/uL — AB (ref 140–400)
RBC: 2.78 10*6/uL — AB (ref 4.20–5.82)
RDW: 17.5 % — ABNORMAL HIGH (ref 11.0–14.6)
WBC: 1.3 10*3/uL — ABNORMAL LOW (ref 4.0–10.3)

## 2014-05-28 LAB — TECHNOLOGIST REVIEW

## 2014-05-28 LAB — HOLD TUBE, BLOOD BANK

## 2014-05-28 MED ORDER — SODIUM CHLORIDE 0.9 % IV SOLN
250.0000 mL | Freq: Once | INTRAVENOUS | Status: AC
  Administered 2014-05-28: 250 mL via INTRAVENOUS

## 2014-05-28 MED ORDER — HEPARIN SOD (PORK) LOCK FLUSH 100 UNIT/ML IV SOLN
500.0000 [IU] | Freq: Every day | INTRAVENOUS | Status: DC | PRN
  Administered 2014-05-28: 500 [IU]
  Filled 2014-05-28: qty 5

## 2014-05-28 MED ORDER — METHYLPREDNISOLONE SODIUM SUCC 40 MG IJ SOLR
INTRAMUSCULAR | Status: AC
  Filled 2014-05-28: qty 1

## 2014-05-28 MED ORDER — DIPHENHYDRAMINE HCL 25 MG PO CAPS
ORAL_CAPSULE | ORAL | Status: AC
  Filled 2014-05-28: qty 1

## 2014-05-28 MED ORDER — SODIUM CHLORIDE 0.9 % IJ SOLN
10.0000 mL | INTRAMUSCULAR | Status: AC | PRN
  Administered 2014-05-28: 10 mL
  Filled 2014-05-28: qty 10

## 2014-05-28 MED ORDER — METHYLPREDNISOLONE SODIUM SUCC 40 MG IJ SOLR
40.0000 mg | Freq: Once | INTRAMUSCULAR | Status: AC
  Administered 2014-05-28: 40 mg via INTRAVENOUS

## 2014-05-28 MED ORDER — ACETAMINOPHEN 325 MG PO TABS
ORAL_TABLET | ORAL | Status: AC
  Filled 2014-05-28: qty 2

## 2014-05-28 MED ORDER — ACETAMINOPHEN 325 MG PO TABS
650.0000 mg | ORAL_TABLET | Freq: Once | ORAL | Status: AC
  Administered 2014-05-28: 650 mg via ORAL

## 2014-05-28 MED ORDER — DIPHENHYDRAMINE HCL 25 MG PO CAPS
25.0000 mg | ORAL_CAPSULE | Freq: Once | ORAL | Status: AC
  Administered 2014-05-28: 25 mg via ORAL

## 2014-05-28 NOTE — Patient Instructions (Signed)
Platelet Transfusion Information This is information about transfusions of platelets. Platelets are tiny cells made by the bone marrow and found in the blood. When a blood vessel is damaged platelets rush to the damaged area to help form a clot. This begins the healing process. When platelets get very low your blood may have trouble clotting. This may be from:  Illness.  Blood disorder.  Chemotherapy to treat cancer. Often lower platelet counts do not usually cause problems.  Platelets usually last for 7 to 10 days. If they are not used not used in an injury, they are broken down by the liver or spleen. Symptoms of low platelet count include:  Nosebleeds.  Bleeding gums.  Heavy periods.  Bruising and tiny blood spots in the skin.  Pin point spots of bleeding are called (petechiae).  Larger bruises (purpura).  Bleeding can be more serious if it happens in the brain or bowel. Platelet transfusions are often used to keep the platelet count at an acceptable level. Serious bleeding due to low platelets is uncommon. RISKS AND COMPLICATIONS Severe side effects from platelet transfusions are uncommon. Minor reactions may include:  Itching.  Rashes.  High temperature and shivering. Medications are available to stop transfusion reactions. Let your caregivers know if you develop any of the above problems.  If you are having platelet transfusions frequently they may get less effective. This is called becoming refractory to platelets. It is uncommon. This can happen from non-immune causes and immune causes. Non-immune causes include:  High temperatures.  Some medications.  An enlarged spleen. Immune causes happen when your body discovers the platelets are not your own and begin making antibodies against them. The antibodies kill the platelets quickly. Even with platelet transfusions you may still notice problems with bleeding or bruising. Let your caregivers know about this. Other things  can be done to help if this happens.  BEFORE THE PROCEDURE   Your doctors will check your platelet count regularly.  If the platelet count is too low it may be necessary to have a platelet transfusion.  This is more important before certain procedures with a risk of bleeding such as a spinal tap.  Platelet transfusion reduces the risk of bleeding during or after the procedure.  Except in emergencies, giving a transfusion requires a written consent. Before blood is taken from a donor, a complete history is taken to make sure the person has no history of previous diseases, nor engages in risky social behavior. Examples of this are intravenous drug use or sexual activity with multiple partners. This could lead to infected blood or blood products being used. This history is done even in spite of the extensive testing to make sure the blood is safe. All blood products transfused are tested to make sure it is a match for the person getting the blood. It is also checked for infections. Blood is the safest it has ever been. The risk of getting an infection is very low. PROCEDURE  The platelets are stored in small plastic bags which are kept at a low temperature.  Each bag is called a unit and sometimes two units are given. They are given through an intravenous line by drip infusion over about one half hour.  Usually blood is collected from multiple people to get enough to transfuse.  Sometimes, the platelets are collected from a single person. This is done using a special machine that separates the platelets from the blood. The machine is called an apheresis machine. Platelets collected   in this way are called apheresed platelets. Apheresed platelets reduce the risk of becoming sensitive to the platelets. This lowers the chances of having a transfusion reaction.  As it only takes a short time to give the platelets, this treatment can be given in an outpatients department. Platelets can also be given  before or after other treatments. SEEK IMMEDIATE MEDICAL CARE IF: Any of the following symptoms over the next 12 hours or several days:  Shaking chills.  Fever with a temperature greater than 102 F (38.9 C) develops.  Back pain or muscle pain.  People around you feel you are not acting correctly, or you are confused.  Blood in the urine or bowel movements or bleeding from any place in your body.  Shortness of breath, or difficulty breathing.  Dizziness.  Fainting.  You break out in a rash or develop hives.  You have a decrease in the amount of urine you are putting out, or the urine turns a dark color or changes to pink, red, or [place].  A severe headache or stiff neck.  Bruising more easily. Document Released: 10/08/2007 Document Revised: 03/04/2012 Document Reviewed: 10/08/2007 Exit[employer] Patient Information 2014 [employer].  

## 2014-05-29 LAB — PREPARE PLATELET PHERESIS: Unit division: 0

## 2014-06-01 ENCOUNTER — Other Ambulatory Visit: Payer: Self-pay | Admitting: Oncology

## 2014-06-01 ENCOUNTER — Other Ambulatory Visit (HOSPITAL_BASED_OUTPATIENT_CLINIC_OR_DEPARTMENT_OTHER): Payer: Medicare Other

## 2014-06-01 ENCOUNTER — Other Ambulatory Visit: Payer: Self-pay | Admitting: Hematology and Oncology

## 2014-06-01 ENCOUNTER — Other Ambulatory Visit: Payer: Self-pay | Admitting: *Deleted

## 2014-06-01 ENCOUNTER — Ambulatory Visit (HOSPITAL_BASED_OUTPATIENT_CLINIC_OR_DEPARTMENT_OTHER): Payer: Medicare Other

## 2014-06-01 ENCOUNTER — Telehealth: Payer: Self-pay | Admitting: Hematology and Oncology

## 2014-06-01 ENCOUNTER — Telehealth: Payer: Self-pay | Admitting: Nurse Practitioner

## 2014-06-01 VITALS — BP 114/63 | HR 67 | Temp 97.8°F | Resp 18

## 2014-06-01 DIAGNOSIS — C9200 Acute myeloblastic leukemia, not having achieved remission: Secondary | ICD-10-CM

## 2014-06-01 DIAGNOSIS — D649 Anemia, unspecified: Secondary | ICD-10-CM

## 2014-06-01 LAB — CBC WITH DIFFERENTIAL/PLATELET
BASO%: 0 % (ref 0.0–2.0)
Basophils Absolute: 0 10*3/uL (ref 0.0–0.1)
EOS%: 0 % (ref 0.0–7.0)
Eosinophils Absolute: 0 10*3/uL (ref 0.0–0.5)
HCT: 22.9 % — ABNORMAL LOW (ref 38.4–49.9)
HGB: 7.5 g/dL — ABNORMAL LOW (ref 13.0–17.1)
LYMPH#: 0.8 10*3/uL — AB (ref 0.9–3.3)
LYMPH%: 74.3 % — ABNORMAL HIGH (ref 14.0–49.0)
MCH: 30.2 pg (ref 27.2–33.4)
MCHC: 32.8 g/dL (ref 32.0–36.0)
MCV: 92.3 fL (ref 79.3–98.0)
MONO#: 0.1 10*3/uL (ref 0.1–0.9)
MONO%: 5.5 % (ref 0.0–14.0)
NEUT#: 0.2 10*3/uL — CL (ref 1.5–6.5)
NEUT%: 20.2 % — ABNORMAL LOW (ref 39.0–75.0)
NRBC: 0 % (ref 0–0)
Platelets: 12 10*3/uL — ABNORMAL LOW (ref 140–400)
RBC: 2.48 10*6/uL — AB (ref 4.20–5.82)
RDW: 17.9 % — ABNORMAL HIGH (ref 11.0–14.6)
WBC: 1.1 10*3/uL — AB (ref 4.0–10.3)

## 2014-06-01 LAB — TECHNOLOGIST REVIEW: Technologist Review: 9

## 2014-06-01 LAB — HOLD TUBE, BLOOD BANK

## 2014-06-01 LAB — PREPARE RBC (CROSSMATCH)

## 2014-06-01 MED ORDER — FUROSEMIDE 10 MG/ML IJ SOLN
20.0000 mg | Freq: Once | INTRAMUSCULAR | Status: AC
  Administered 2014-06-01: 20 mg via INTRAVENOUS

## 2014-06-01 MED ORDER — HEPARIN SOD (PORK) LOCK FLUSH 100 UNIT/ML IV SOLN
500.0000 [IU] | Freq: Every day | INTRAVENOUS | Status: AC | PRN
  Administered 2014-06-01: 500 [IU]
  Filled 2014-06-01: qty 5

## 2014-06-01 MED ORDER — ACETAMINOPHEN 325 MG PO TABS
ORAL_TABLET | ORAL | Status: AC
  Filled 2014-06-01: qty 2

## 2014-06-01 MED ORDER — SODIUM CHLORIDE 0.9 % IV SOLN
250.0000 mL | Freq: Once | INTRAVENOUS | Status: AC
  Administered 2014-06-01: 250 mL via INTRAVENOUS

## 2014-06-01 MED ORDER — DIPHENHYDRAMINE HCL 25 MG PO CAPS
25.0000 mg | ORAL_CAPSULE | Freq: Once | ORAL | Status: AC
  Administered 2014-06-01: 25 mg via ORAL

## 2014-06-01 MED ORDER — ACETAMINOPHEN 325 MG PO TABS
650.0000 mg | ORAL_TABLET | Freq: Once | ORAL | Status: AC
  Administered 2014-06-01: 650 mg via ORAL

## 2014-06-01 MED ORDER — SODIUM CHLORIDE 0.9 % IJ SOLN
10.0000 mL | INTRAMUSCULAR | Status: AC | PRN
  Administered 2014-06-01: 10 mL
  Filled 2014-06-01: qty 10

## 2014-06-01 MED ORDER — DIPHENHYDRAMINE HCL 25 MG PO CAPS
ORAL_CAPSULE | ORAL | Status: AC
  Filled 2014-06-01: qty 1

## 2014-06-01 NOTE — Telephone Encounter (Signed)
Spoke to patient in the lobby and informed him that he will get blood transfusion today for hgb 7.5. He reports having a sore throat x2 days with night sweating. Patient does not know if he had a fever because he does not have a thermometer at home. He reports he is out of Amox rx. Per MD, no refill at this time unless patient is febrile. This RN to inform patient while in infusion room.

## 2014-06-01 NOTE — Patient Instructions (Signed)
Blood Transfusion  A blood transfusion replaces your blood or some of its parts. Blood is replaced when you have lost blood because of surgery, an accident, or for severe blood conditions like anemia. You can donate blood to be used on yourself if you have a planned surgery. If you lose blood during that surgery, your own blood can be given back to you. Any blood given to you is checked to make sure it matches your blood type. Your temperature, blood pressure, and heart rate (vital signs) will be checked often.  GET HELP RIGHT AWAY IF:   You feel sick to your stomach (nauseous) or throw up (vomit).  You have watery poop (diarrhea).  You have shortness of breath or trouble breathing.  You have blood in your pee (urine) or have dark colored pee.  You have chest pain or tightness.  Your eyes or skin turn yellow (jaundice).  You have a temperature by mouth above 102 F (38.9 C), not controlled by medicine.  You start to shake and have chills.  You develop a a red rash (hives) or feel itchy.  You develop lightheadedness or feel confused.  You develop back, joint, or muscle pain.  You do not feel hungry (lost appetite).  You feel tired, restless, or nervous.  You develop belly (abdominal) cramps. Document Released: 03/09/2009 Document Revised: 03/04/2012 Document Reviewed: 03/09/2009 [employer] Patient Information 2014 [employer].  

## 2014-06-01 NOTE — Telephone Encounter (Signed)
NG pt scheduled on CP1. 6/22 f/u moved to NG. pt given new appt schedule while in inf. no other appts per 6/8 pof - pt already having PRBC's.

## 2014-06-02 LAB — TYPE AND SCREEN
ABO/RH(D): O POS
Antibody Screen: NEGATIVE
Unit division: 0
Unit division: 0

## 2014-06-03 ENCOUNTER — Other Ambulatory Visit: Payer: Self-pay | Admitting: Hematology and Oncology

## 2014-06-03 DIAGNOSIS — C9200 Acute myeloblastic leukemia, not having achieved remission: Secondary | ICD-10-CM

## 2014-06-04 ENCOUNTER — Telehealth: Payer: Self-pay | Admitting: *Deleted

## 2014-06-04 ENCOUNTER — Other Ambulatory Visit (HOSPITAL_BASED_OUTPATIENT_CLINIC_OR_DEPARTMENT_OTHER): Payer: Medicare Other

## 2014-06-04 DIAGNOSIS — D696 Thrombocytopenia, unspecified: Secondary | ICD-10-CM

## 2014-06-04 DIAGNOSIS — D72819 Decreased white blood cell count, unspecified: Secondary | ICD-10-CM

## 2014-06-04 DIAGNOSIS — C9200 Acute myeloblastic leukemia, not having achieved remission: Secondary | ICD-10-CM

## 2014-06-04 DIAGNOSIS — D649 Anemia, unspecified: Secondary | ICD-10-CM

## 2014-06-04 LAB — CBC WITH DIFFERENTIAL/PLATELET
BASO%: 0 % (ref 0.0–2.0)
BASOS ABS: 0 10*3/uL (ref 0.0–0.1)
EOS%: 0.6 % (ref 0.0–7.0)
Eosinophils Absolute: 0 10*3/uL (ref 0.0–0.5)
HCT: 27.9 % — ABNORMAL LOW (ref 38.4–49.9)
HEMOGLOBIN: 9.3 g/dL — AB (ref 13.0–17.1)
LYMPH#: 1.2 10*3/uL (ref 0.9–3.3)
LYMPH%: 74.7 % — ABNORMAL HIGH (ref 14.0–49.0)
MCH: 30.8 pg (ref 27.2–33.4)
MCHC: 33.3 g/dL (ref 32.0–36.0)
MCV: 92.4 fL (ref 79.3–98.0)
MONO#: 0 10*3/uL — ABNORMAL LOW (ref 0.1–0.9)
MONO%: 1.9 % (ref 0.0–14.0)
NEUT%: 22.8 % — ABNORMAL LOW (ref 39.0–75.0)
NEUTROS ABS: 0.4 10*3/uL — AB (ref 1.5–6.5)
Platelets: 15 10*3/uL — ABNORMAL LOW (ref 140–400)
RBC: 3.02 10*6/uL — ABNORMAL LOW (ref 4.20–5.82)
RDW: 17.6 % — ABNORMAL HIGH (ref 11.0–14.6)
WBC: 1.5 10*3/uL — ABNORMAL LOW (ref 4.0–10.3)

## 2014-06-04 LAB — TECHNOLOGIST REVIEW: Technologist Review: 10

## 2014-06-04 LAB — HOLD TUBE, BLOOD BANK

## 2014-06-04 NOTE — Telephone Encounter (Signed)
Informed pt of lab results today and no need for blood or platelet transfusions. Keep lab appt as scheduled on Monday.  He verbalized understanding and Reports ongoing cough for past week.  States coughing esp bad at night.  Taking OTC cough and nightime medicine.  States no fevers.  Occasionally productive of yellow/green sputum.

## 2014-06-05 ENCOUNTER — Other Ambulatory Visit: Payer: Self-pay | Admitting: Medical Oncology

## 2014-06-08 ENCOUNTER — Other Ambulatory Visit (HOSPITAL_BASED_OUTPATIENT_CLINIC_OR_DEPARTMENT_OTHER): Payer: Medicare Other

## 2014-06-08 ENCOUNTER — Encounter: Payer: Self-pay | Admitting: Hematology and Oncology

## 2014-06-08 ENCOUNTER — Encounter: Payer: Self-pay | Admitting: *Deleted

## 2014-06-08 DIAGNOSIS — D72819 Decreased white blood cell count, unspecified: Secondary | ICD-10-CM

## 2014-06-08 DIAGNOSIS — D649 Anemia, unspecified: Secondary | ICD-10-CM

## 2014-06-08 DIAGNOSIS — C9200 Acute myeloblastic leukemia, not having achieved remission: Secondary | ICD-10-CM

## 2014-06-08 DIAGNOSIS — D696 Thrombocytopenia, unspecified: Secondary | ICD-10-CM

## 2014-06-08 LAB — CBC WITH DIFFERENTIAL/PLATELET
HCT: 26.5 % — ABNORMAL LOW (ref 38.4–49.9)
HGB: 8.6 g/dL — ABNORMAL LOW (ref 13.0–17.1)
MCH: 30.2 pg (ref 27.2–33.4)
MCHC: 32.5 g/dL (ref 32.0–36.0)
MCV: 93 fL (ref 79.3–98.0)
PLATELETS: 15 10*3/uL — AB (ref 140–400)
RBC: 2.85 10*6/uL — ABNORMAL LOW (ref 4.20–5.82)
RDW: 17.6 % — AB (ref 11.0–14.6)
WBC: 1.7 10*3/uL — ABNORMAL LOW (ref 4.0–10.3)

## 2014-06-08 LAB — MANUAL DIFFERENTIAL
ALC: 0.8 10*3/uL — ABNORMAL LOW (ref 0.9–3.3)
ANC (CHCC manual diff): 0.4 10*3/uL — CL (ref 1.5–6.5)
BLASTS: 15 % — AB (ref 0–0)
Band Neutrophils: 0 % (ref 0–10)
Basophil: 0 % (ref 0–2)
EOS: 0 % (ref 0–7)
LYMPH: 49 % (ref 14–49)
MONO: 10 % (ref 0–14)
MYELOCYTES: 1 % — AB (ref 0–0)
Metamyelocytes: 0 % (ref 0–0)
OTHER CELL: 0 % (ref 0–0)
PLT EST: DECREASED
PROMYELO: 0 % (ref 0–0)
SEG: 25 % — AB (ref 38–77)
Variant Lymph: 0 % (ref 0–0)
nRBC: 1 % — ABNORMAL HIGH (ref 0–0)

## 2014-06-08 LAB — HOLD TUBE, BLOOD BANK

## 2014-06-08 NOTE — Progress Notes (Signed)
Spoke to pt in lobby and informed of no need for transfusion today.  His Hgb is above 8.0 and his Platelets are greater than 10.  He will return on Thursday for lab as scheduled.  He reports ongoing chest congestion and coughing.  Denies any fevers.  Denies any worsening of symptoms.  He is taking OTC cough remedy which are helping.  Instructed pt to notify us if he develops any fevers or symptoms worsen, any worsening sob over baseline.  He verbalized understanding.

## 2014-06-11 ENCOUNTER — Other Ambulatory Visit (HOSPITAL_BASED_OUTPATIENT_CLINIC_OR_DEPARTMENT_OTHER): Payer: Medicare Other

## 2014-06-11 ENCOUNTER — Other Ambulatory Visit: Payer: Self-pay | Admitting: Hematology and Oncology

## 2014-06-11 DIAGNOSIS — C9200 Acute myeloblastic leukemia, not having achieved remission: Secondary | ICD-10-CM

## 2014-06-11 DIAGNOSIS — D649 Anemia, unspecified: Secondary | ICD-10-CM

## 2014-06-11 LAB — CBC WITH DIFFERENTIAL/PLATELET
BASO%: 0.4 % (ref 0.0–2.0)
BASOS ABS: 0 10*3/uL (ref 0.0–0.1)
EOS ABS: 0 10*3/uL (ref 0.0–0.5)
EOS%: 0 % (ref 0.0–7.0)
HEMATOCRIT: 26 % — AB (ref 38.4–49.9)
HEMOGLOBIN: 8.4 g/dL — AB (ref 13.0–17.1)
LYMPH%: 55.4 % — AB (ref 14.0–49.0)
MCH: 29.9 pg (ref 27.2–33.4)
MCHC: 32.3 g/dL (ref 32.0–36.0)
MCV: 92.5 fL (ref 79.3–98.0)
MONO#: 0.4 10*3/uL (ref 0.1–0.9)
MONO%: 15.2 % — ABNORMAL HIGH (ref 0.0–14.0)
NEUT%: 29 % — ABNORMAL LOW (ref 39.0–75.0)
NEUTROS ABS: 0.8 10*3/uL — AB (ref 1.5–6.5)
PLATELETS: 19 10*3/uL — AB (ref 140–400)
RBC: 2.81 10*6/uL — ABNORMAL LOW (ref 4.20–5.82)
RDW: 17.4 % — ABNORMAL HIGH (ref 11.0–14.6)
WBC: 2.8 10*3/uL — ABNORMAL LOW (ref 4.0–10.3)
lymph#: 1.5 10*3/uL (ref 0.9–3.3)
nRBC: 0 % (ref 0–0)

## 2014-06-11 LAB — HOLD TUBE, BLOOD BANK

## 2014-06-11 LAB — TECHNOLOGIST REVIEW: Technologist Review: 17

## 2014-06-12 ENCOUNTER — Telehealth: Payer: Self-pay | Admitting: Hematology and Oncology

## 2014-06-12 NOTE — Telephone Encounter (Signed)
s.w. pt and advised on 6.25.14 appt....pt ok and aware

## 2014-06-15 ENCOUNTER — Other Ambulatory Visit: Payer: Medicare Other

## 2014-06-15 ENCOUNTER — Ambulatory Visit: Payer: Medicare Other | Admitting: Hematology and Oncology

## 2014-06-18 ENCOUNTER — Ambulatory Visit (HOSPITAL_BASED_OUTPATIENT_CLINIC_OR_DEPARTMENT_OTHER): Payer: Medicare Other

## 2014-06-18 ENCOUNTER — Ambulatory Visit (HOSPITAL_BASED_OUTPATIENT_CLINIC_OR_DEPARTMENT_OTHER): Payer: Medicare Other | Admitting: Hematology and Oncology

## 2014-06-18 ENCOUNTER — Encounter: Payer: Self-pay | Admitting: Hematology and Oncology

## 2014-06-18 ENCOUNTER — Other Ambulatory Visit (HOSPITAL_BASED_OUTPATIENT_CLINIC_OR_DEPARTMENT_OTHER): Payer: Medicare Other

## 2014-06-18 ENCOUNTER — Telehealth: Payer: Self-pay | Admitting: Hematology and Oncology

## 2014-06-18 VITALS — BP 111/63 | HR 76 | Temp 97.9°F | Resp 30

## 2014-06-18 VITALS — BP 103/56 | HR 98 | Temp 97.8°F | Resp 20 | Ht 68.0 in | Wt 173.1 lb

## 2014-06-18 DIAGNOSIS — D630 Anemia in neoplastic disease: Secondary | ICD-10-CM

## 2014-06-18 DIAGNOSIS — D72819 Decreased white blood cell count, unspecified: Secondary | ICD-10-CM

## 2014-06-18 DIAGNOSIS — R059 Cough, unspecified: Secondary | ICD-10-CM

## 2014-06-18 DIAGNOSIS — R05 Cough: Secondary | ICD-10-CM

## 2014-06-18 DIAGNOSIS — R5381 Other malaise: Secondary | ICD-10-CM

## 2014-06-18 DIAGNOSIS — C9200 Acute myeloblastic leukemia, not having achieved remission: Secondary | ICD-10-CM

## 2014-06-18 DIAGNOSIS — R5383 Other fatigue: Secondary | ICD-10-CM

## 2014-06-18 DIAGNOSIS — D696 Thrombocytopenia, unspecified: Secondary | ICD-10-CM

## 2014-06-18 DIAGNOSIS — J449 Chronic obstructive pulmonary disease, unspecified: Secondary | ICD-10-CM

## 2014-06-18 DIAGNOSIS — R630 Anorexia: Secondary | ICD-10-CM

## 2014-06-18 LAB — CBC WITH DIFFERENTIAL/PLATELET
BASO%: 0.2 % (ref 0.0–2.0)
BASOS ABS: 0 10*3/uL (ref 0.0–0.1)
EOS%: 0 % (ref 0.0–7.0)
Eosinophils Absolute: 0 10*3/uL (ref 0.0–0.5)
HCT: 22.1 % — ABNORMAL LOW (ref 38.4–49.9)
HEMOGLOBIN: 7.2 g/dL — AB (ref 13.0–17.1)
LYMPH%: 45.1 % (ref 14.0–49.0)
MCH: 30 pg (ref 27.2–33.4)
MCHC: 32.6 g/dL (ref 32.0–36.0)
MCV: 92.1 fL (ref 79.3–98.0)
MONO#: 0.8 10*3/uL (ref 0.1–0.9)
MONO%: 18.5 % — AB (ref 0.0–14.0)
NEUT%: 36.2 % — ABNORMAL LOW (ref 39.0–75.0)
NEUTROS ABS: 1.6 10*3/uL (ref 1.5–6.5)
PLATELETS: 24 10*3/uL — AB (ref 140–400)
RBC: 2.4 10*6/uL — ABNORMAL LOW (ref 4.20–5.82)
RDW: 17.2 % — ABNORMAL HIGH (ref 11.0–14.6)
WBC: 4.3 10*3/uL (ref 4.0–10.3)
lymph#: 1.9 10*3/uL (ref 0.9–3.3)
nRBC: 0 % (ref 0–0)

## 2014-06-18 LAB — COMPREHENSIVE METABOLIC PANEL (CC13)
ALBUMIN: 2.6 g/dL — AB (ref 3.5–5.0)
ALT: 27 U/L (ref 0–55)
ANION GAP: 10 meq/L (ref 3–11)
AST: 17 U/L (ref 5–34)
Alkaline Phosphatase: 77 U/L (ref 40–150)
BILIRUBIN TOTAL: 0.42 mg/dL (ref 0.20–1.20)
BUN: 48.6 mg/dL — ABNORMAL HIGH (ref 7.0–26.0)
CHLORIDE: 111 meq/L — AB (ref 98–109)
CO2: 21 mEq/L — ABNORMAL LOW (ref 22–29)
Calcium: 9.3 mg/dL (ref 8.4–10.4)
Creatinine: 2 mg/dL — ABNORMAL HIGH (ref 0.7–1.3)
GLUCOSE: 105 mg/dL (ref 70–140)
Potassium: 4.8 mEq/L (ref 3.5–5.1)
SODIUM: 142 meq/L (ref 136–145)
Total Protein: 6.5 g/dL (ref 6.4–8.3)

## 2014-06-18 LAB — HOLD TUBE, BLOOD BANK

## 2014-06-18 LAB — PREPARE RBC (CROSSMATCH)

## 2014-06-18 LAB — TECHNOLOGIST REVIEW: Technologist Review: 20

## 2014-06-18 MED ORDER — DIPHENHYDRAMINE HCL 25 MG PO CAPS
ORAL_CAPSULE | ORAL | Status: AC
  Filled 2014-06-18: qty 1

## 2014-06-18 MED ORDER — DIPHENHYDRAMINE HCL 25 MG PO CAPS
25.0000 mg | ORAL_CAPSULE | Freq: Every day | ORAL | Status: DC | PRN
  Administered 2014-06-18: 25 mg via ORAL

## 2014-06-18 MED ORDER — MEGESTROL ACETATE 40 MG PO TABS: 80.0000 mg | ORAL_TABLET | Freq: Two times a day (BID) | ORAL | Status: AC

## 2014-06-18 MED ORDER — SODIUM CHLORIDE 0.9 % IJ SOLN
10.0000 mL | INTRAMUSCULAR | Status: AC | PRN
Start: 2014-06-18 — End: 2014-06-18
  Administered 2014-06-18: 10 mL
  Filled 2014-06-18: qty 10

## 2014-06-18 MED ORDER — ACETAMINOPHEN 325 MG PO TABS
650.0000 mg | ORAL_TABLET | Freq: Every day | ORAL | Status: DC | PRN
  Administered 2014-06-18: 650 mg via ORAL

## 2014-06-18 MED ORDER — ACETAMINOPHEN 325 MG PO TABS
ORAL_TABLET | ORAL | Status: AC
  Filled 2014-06-18: qty 2

## 2014-06-18 MED ORDER — SODIUM CHLORIDE 0.9 % IV SOLN
250.0000 mL | Freq: Once | INTRAVENOUS | Status: AC
  Administered 2014-06-18: 250 mL via INTRAVENOUS

## 2014-06-18 MED ORDER — HEPARIN SOD (PORK) LOCK FLUSH 100 UNIT/ML IV SOLN
500.0000 [IU] | Freq: Every day | INTRAVENOUS | Status: AC | PRN
  Administered 2014-06-18: 500 [IU]
  Filled 2014-06-18: qty 5

## 2014-06-18 NOTE — Assessment & Plan Note (Signed)
This is due to his cancer. I recommend a trial of Megace.

## 2014-06-18 NOTE — Patient Instructions (Signed)

## 2014-06-18 NOTE — Telephone Encounter (Signed)
gv adn printed appt sched and avs for pt for July  °

## 2014-06-18 NOTE — Assessment & Plan Note (Signed)
This is due to disease. He is not symptomatic.

## 2014-06-18 NOTE — Assessment & Plan Note (Signed)
He has chronic productive cough, likely due to a COPD. I would not recommend antibiotics for now.

## 2014-06-18 NOTE — Progress Notes (Signed)
Dr. Alvy Bimler notified of patient's consistently low blood pressure. Per Dr.Gorsuch, patient can discontinue lisinopril and norvasc. Patient and son notified of this and agreeable to this change. This RN wrote to discontinue both of these meds on patient's discharge papers. Cindi Carbon, RN

## 2014-06-18 NOTE — Assessment & Plan Note (Signed)
We have made an informed decision to discontinue chemotherapy 2 months ago and just to provide transfusion support. The number of blasts seen in his peripheral blood is getting worse. We had a frank discussion today. The patient is almost ready to be enrolled in hospice. He wants to proceed with more blood transfusion this week and will probably make up his mind next week about hospice care.

## 2014-06-18 NOTE — Assessment & Plan Note (Signed)
The patient is symptomatic. We will proceed with 2 units of blood transfusion.

## 2014-06-18 NOTE — Assessment & Plan Note (Signed)
The patient has mild productive cough of white sputum. I would defer antibiotic therapy for now as clinically, he does not look infected.

## 2014-06-18 NOTE — Progress Notes (Signed)
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 Since then, he had multiple bone marrow aspirate and biopsy. His initial bone marrow biopsy in October 2012 showed 20% blast, suspicious for AML. Due to his age, he was offered Vidaza. In May of 2014, repeat bone marrow aspirate and biopsy show his blast count has reduced to about 9%. In November 2014, repeat bone marrow aspirate and biopsy confirmed complete remission. Between November 2014 to February 2015, he had multiple dosage adjustment to his treatment due to pancytopenia. On 02/23/2014, repeat bone marrow aspirate and biopsy showed progressive leukemia. The patient went to [facility] again for a second opinion and subsequently return here for palliative treatment. On 03/09/2014, we restarted him on Vidaza at 100 mg/m2, 7 days on, out of cycle of 35 days On 04/13/2014, his treatment is switched to intravenous form On 05/14/2014, repeat bone marrow aspirate and biopsy showed persistent disease with mild progression  INTERVAL HISTORY: Please see below for problem oriented charting. He complained of fatigue, loss of appetite and chronic productive cough of white sputum. The patient denies any recent signs or symptoms of bleeding such as spontaneous epistaxis, hematuria or hematochezia. He denies any recent fever, chills, night sweats or diarrhea.  REVIEW OF SYSTEMS:   Constitutional: Denies fevers, chills  Eyes: Denies blurriness of vision Ears, nose, mouth, throat, and face: Denies mucositis or sore  throat Cardiovascular: Denies palpitation, chest discomfort or lower extremity swelling Gastrointestinal:  Denies nausea, heartburn or change in bowel habits Skin: Denies abnormal skin rashes Lymphatics: Denies new lymphadenopathy Neurological:Denies numbness, tingling or new weaknesses Behavioral/Psych: Mood is stable, no new changes  All other systems were reviewed with the patient and are negative.  I have reviewed the past medical history, past surgical history, social history and family history with the patient and they are unchanged from previous note.  ALLERGIES:  has No Known Allergies.  MEDICATIONS:  Current Outpatient Prescriptions  Medication Sig Dispense Refill  . acyclovir (ZOVIRAX) 400 MG tablet Take 1 tablet (400 mg total) by mouth daily.  30 tablet  6  . alendronate (FOSAMAX) 70 MG tablet Take 70 mg by mouth every Monday. Take with a full glass of water on an empty stomach.      Marland Kitchen amLODipine (NORVASC) 5 MG tablet Take 1 tablet (5 mg total) by mouth daily before breakfast.  90 tablet  0  . Calcium Carbonate-Vitamin D (CALCIUM 600 + D PO) Take 1 tablet by mouth 2 (two) times daily.      . finasteride (PROSCAR) 5 MG tablet Take 5 mg by mouth daily.      Marland Kitchen levothyroxine (SYNTHROID, LEVOTHROID) 50 MCG tablet Take 50 mcg by mouth every evening.       Marland Kitchen lisinopril (PRINIVIL,ZESTRIL) 10 MG tablet Take 10 mg by mouth every evening.       Marland Kitchen LORazepam (ATIVAN) 0.5 MG tablet Take 0.5 mg by mouth at bedtime as needed for anxiety (sleep).       . metoprolol succinate (TOPROL-XL) 100 MG 24 hr tablet Take 1 tablet (100 mg total) by mouth daily before breakfast. Take  with or immediately following a meal.  90 tablet  3  . Multiple Vitamin (MULTIVITAMIN WITH MINERALS) TABS Take 1 tablet by mouth daily.      . simvastatin (ZOCOR) 20 MG tablet Take 20 mg by mouth at bedtime.        Marland Kitchen terazosin (HYTRIN) 10 MG capsule Take 10 mg by mouth at bedtime.      . vitamin B-12 (CYANOCOBALAMIN) 1000 MCG  tablet Take 1,000 mcg by mouth daily.      . vitamin C (ASCORBIC ACID) 500 MG tablet Take 500 mg by mouth daily.      Marland Kitchen amoxicillin-clavulanate (AUGMENTIN) 875-125 MG per tablet Take 1 tablet by mouth 2 (two) times daily. For tooth abcess  14 tablet  1  . lidocaine-prilocaine (EMLA) cream Apply 1 application topically as needed. Apply to [facility] a cath site as needle prior to needle stick.  30 g  2  . megestrol (MEGACE) 40 MG tablet Take 2 tablets (80 mg total) by mouth 2 (two) times daily.  100 tablet  3   No current facility-administered medications for this visit.   Facility-Administered Medications Ordered in Other Visits  Medication Dose Route Frequency Provider Last Rate Last Dose  . acetaminophen (TYLENOL) tablet 650 mg  650 mg Oral Daily PRN Heath Lark, MD      . diphenhydrAMINE (BENADRYL) capsule 25 mg  25 mg Oral Daily PRN Heath Lark, MD      . heparin lock flush 100 unit/mL  500 Units Intracatheter Daily PRN Heath Lark, MD      . sodium chloride 0.9 % injection 10 mL  10 mL Intracatheter PRN Heath Lark, MD        PHYSICAL EXAMINATION: ECOG PERFORMANCE STATUS: 2 - Symptomatic, <50% confined to bed  Filed Vitals:   06/18/14 1309  BP: 103/56  Pulse: 98  Temp: 97.8 F (36.6 C)  Resp: 20   Filed Weights   06/18/14 1309  Weight: 173 lb 1.6 oz (78.518 kg)    GENERAL:alert, no distress and comfortable SKIN: skin color is pale, texture, turgor are normal, no rashes or significant lesions EYES: normal, Conjunctiva are pale and non-injected, sclera clear OROPHARYNX:no exudate, no erythema and lips, buccal mucosa, and tongue normal  NECK: supple, thyroid normal size, non-tender, without nodularity LYMPH:  no palpable lymphadenopathy in the cervical, axillary or inguinal LUNGS: clear to auscultation and percussion with normal breathing effort HEART: regular rate & rhythm and no murmurs and no lower extremity edema ABDOMEN:abdomen soft, non-tender and normal bowel  sounds Musculoskeletal:no cyanosis of digits and no clubbing  NEURO: alert & oriented x 3 with fluent speech, no focal motor/sensory deficits  LABORATORY DATA:  I have reviewed the data as listed    Component Value Date/Time   NA 142 06/18/2014 1249   NA 140 11/04/2012 1201   K 4.8 06/18/2014 1249   K 4.0 11/04/2012 1201   CL 111* 06/18/2013 0854   CL 107 11/04/2012 1201   CO2 21* 06/18/2014 1249   CO2 26 11/04/2012 1201   GLUCOSE 105 06/18/2014 1249   GLUCOSE 117* 06/18/2013 0854   GLUCOSE 85 11/04/2012 1201   BUN 48.6* 06/18/2014 1249   BUN 26* 11/04/2012 1201   CREATININE 2.0* 06/18/2014 1249   CREATININE 1.56* 11/04/2012 1201   CALCIUM 9.3 06/18/2014 1249   CALCIUM 9.9 11/04/2012 1201   PROT 6.5 06/18/2014 1249   ALBUMIN 2.6* 06/18/2014 1249   AST 17 06/18/2014 1249   ALT 27 06/18/2014 1249  ALKPHOS 77 06/18/2014 1249   BILITOT 0.42 06/18/2014 1249   GFRNONAA 39* 11/04/2012 1201   GFRAA 46* 11/04/2012 1201    No results found for this basename: SPEP,  UPEP,   kappa and lambda light chains    Lab Results  Component Value Date   WBC 4.3 06/18/2014   NEUTROABS 1.6 06/18/2014   HGB 7.2* 06/18/2014   HCT 22.1* 06/18/2014   MCV 92.1 06/18/2014   PLT 24* 06/18/2014      Chemistry      Component Value Date/Time   NA 142 06/18/2014 1249   NA 140 11/04/2012 1201   K 4.8 06/18/2014 1249   K 4.0 11/04/2012 1201   CL 111* 06/18/2013 0854   CL 107 11/04/2012 1201   CO2 21* 06/18/2014 1249   CO2 26 11/04/2012 1201   BUN 48.6* 06/18/2014 1249   BUN 26* 11/04/2012 1201   CREATININE 2.0* 06/18/2014 1249   CREATININE 1.56* 11/04/2012 1201      Component Value Date/Time   CALCIUM 9.3 06/18/2014 1249   CALCIUM 9.9 11/04/2012 1201   ALKPHOS 77 06/18/2014 1249   AST 17 06/18/2014 1249   ALT 27 06/18/2014 1249   BILITOT 0.42 06/18/2014 1249      ASSESSMENT & PLAN:  AML (acute myelogenous leukemia) We have made an informed decision to discontinue chemotherapy 2 months ago and just to provide  transfusion support. The number of blasts seen in his peripheral blood is getting worse. We had a frank discussion today. The patient is almost ready to be enrolled in hospice. He wants to proceed with more blood transfusion this week and will probably make up his mind next week about hospice care.  Anemia in neoplastic disease The patient is symptomatic. We will proceed with 2 units of blood transfusion.  Thrombocytopenia, unspecified This is due to disease. He is not symptomatic.  Leukopenia The patient has mild productive cough of white sputum. I would defer antibiotic therapy for now as clinically, he does not look infected.  COPD He has chronic productive cough, likely due to a COPD. I would not recommend antibiotics for now.  Anorexia This is due to his cancer. I recommend a trial of Megace.    All questions were answered. The patient knows to call the clinic with any problems, questions or concerns. No barriers to learning was detected. I spent 25 minutes counseling the patient face to face. The total time spent in the appointment was 30 minutes and more than 50% was on counseling and review of test results     [facility], [place], MD 06/18/2014 1:58 PM

## 2014-06-19 LAB — TYPE AND SCREEN
ABO/RH(D): O POS
Antibody Screen: NEGATIVE
UNIT DIVISION: 0
Unit division: 0

## 2014-06-25 ENCOUNTER — Other Ambulatory Visit (HOSPITAL_BASED_OUTPATIENT_CLINIC_OR_DEPARTMENT_OTHER): Payer: Medicare Other

## 2014-06-25 ENCOUNTER — Other Ambulatory Visit: Payer: Self-pay | Admitting: Hematology and Oncology

## 2014-06-25 ENCOUNTER — Ambulatory Visit (HOSPITAL_COMMUNITY)
Admission: RE | Admit: 2014-06-25 | Discharge: 2014-06-25 | Disposition: A | Discharge status: Home / Self Care | Payer: Medicare Other | Source: Ambulatory Visit | Attending: Hematology and Oncology | Admitting: Hematology and Oncology

## 2014-06-25 ENCOUNTER — Ambulatory Visit (HOSPITAL_BASED_OUTPATIENT_CLINIC_OR_DEPARTMENT_OTHER): Payer: Medicare Other

## 2014-06-25 ENCOUNTER — Telehealth: Payer: Self-pay | Admitting: *Deleted

## 2014-06-25 VITALS — BP 123/70 | HR 81 | Temp 99.3°F | Resp 24

## 2014-06-25 DIAGNOSIS — C9200 Acute myeloblastic leukemia, not having achieved remission: Secondary | ICD-10-CM

## 2014-06-25 DIAGNOSIS — D630 Anemia in neoplastic disease: Secondary | ICD-10-CM

## 2014-06-25 LAB — CBC WITH DIFFERENTIAL/PLATELET
BASO%: 0.2 % (ref 0.0–2.0)
Basophils Absolute: 0 10*3/uL (ref 0.0–0.1)
EOS ABS: 0 10*3/uL (ref 0.0–0.5)
EOS%: 0 % (ref 0.0–7.0)
HCT: 24.2 % — ABNORMAL LOW (ref 38.4–49.9)
HGB: 7.9 g/dL — ABNORMAL LOW (ref 13.0–17.1)
LYMPH%: 29.6 % (ref 14.0–49.0)
MCH: 29.6 pg (ref 27.2–33.4)
MCHC: 32.6 g/dL (ref 32.0–36.0)
MCV: 90.6 fL (ref 79.3–98.0)
MONO#: 0.6 10*3/uL (ref 0.1–0.9)
MONO%: 11.3 % (ref 0.0–14.0)
NEUT%: 58.9 % (ref 39.0–75.0)
NEUTROS ABS: 3.2 10*3/uL (ref 1.5–6.5)
PLATELETS: 8 10*3/uL — AB (ref 140–400)
RBC: 2.67 10*6/uL — ABNORMAL LOW (ref 4.20–5.82)
RDW: 16.6 % — AB (ref 11.0–14.6)
WBC: 5.4 10*3/uL (ref 4.0–10.3)
lymph#: 1.6 10*3/uL (ref 0.9–3.3)
nRBC: 0 % (ref 0–0)

## 2014-06-25 LAB — PREPARE RBC (CROSSMATCH)

## 2014-06-25 LAB — TECHNOLOGIST REVIEW

## 2014-06-25 LAB — HOLD TUBE, BLOOD BANK

## 2014-06-25 MED ORDER — SODIUM CHLORIDE 0.9 % IV SOLN
250.0000 mL | Freq: Once | INTRAVENOUS | Status: AC
  Administered 2014-06-25: 250 mL via INTRAVENOUS

## 2014-06-25 MED ORDER — ACETAMINOPHEN 325 MG PO TABS
ORAL_TABLET | ORAL | Status: AC
  Filled 2014-06-25: qty 2

## 2014-06-25 MED ORDER — HEPARIN SOD (PORK) LOCK FLUSH 100 UNIT/ML IV SOLN
500.0000 [IU] | Freq: Every day | INTRAVENOUS | Status: AC | PRN
  Administered 2014-06-25: 500 [IU]
  Filled 2014-06-25: qty 5

## 2014-06-25 MED ORDER — ACETAMINOPHEN 325 MG PO TABS
650.0000 mg | ORAL_TABLET | Freq: Once | ORAL | Status: AC
  Administered 2014-06-25: 650 mg via ORAL

## 2014-06-25 MED ORDER — DIPHENHYDRAMINE HCL 25 MG PO CAPS
ORAL_CAPSULE | ORAL | Status: AC
  Filled 2014-06-25: qty 1

## 2014-06-25 MED ORDER — FUROSEMIDE 10 MG/ML IJ SOLN
20.0000 mg | Freq: Once | INTRAMUSCULAR | Status: AC
  Administered 2014-06-25: 20 mg via INTRAVENOUS

## 2014-06-25 MED ORDER — DIPHENHYDRAMINE HCL 25 MG PO CAPS
25.0000 mg | ORAL_CAPSULE | Freq: Once | ORAL | Status: AC
  Administered 2014-06-25: 25 mg via ORAL

## 2014-06-25 MED ORDER — SODIUM CHLORIDE 0.9 % IJ SOLN
10.0000 mL | INTRAMUSCULAR | Status: AC | PRN
  Administered 2014-06-25: 10 mL
  Filled 2014-06-25: qty 10

## 2014-06-25 NOTE — Progress Notes (Signed)
Dr. Alvy Bimler notified of pt c/o increased work of breathing and continued cough. O2 sat97% on RA. MD ordered IV lasix after plt and prior to rbc transfusion today, see MAR. This RN to continue to monitor.

## 2014-06-25 NOTE — Progress Notes (Signed)
Patient has had a good response to the lasix. He has voided 3x since getting the lasix.  Last vol of urine was 350cc with last voiding.  He says he can breathe better.

## 2014-06-25 NOTE — Telephone Encounter (Signed)
Referral made to [facility] for home care services.  Dr. Alvy Bimler will be the Attending physician and Hospice to provide symptom management.   Spoke w/ son and pt in lobby and pt said he is ready to stop the transfusions.  His last transfusion will be today.

## 2014-06-25 NOTE — Patient Instructions (Signed)
Blood Transfusion Information WHAT IS A BLOOD TRANSFUSION? A transfusion is the replacement of blood or some of its parts. Blood is made up of multiple cells which provide different functions.  Red blood cells carry oxygen and are used for blood loss replacement.  White blood cells fight against infection.  Platelets control bleeding.  Plasma helps clot blood.  Other blood products are available for specialized needs, such as hemophilia or other clotting disorders. BEFORE THE TRANSFUSION  Who gives blood for transfusions?   You may be able to donate blood to be used at a later date on yourself (autologous donation).  Relatives can be asked to donate blood. This is generally not any safer than if you have received blood from a stranger. The same precautions are taken to ensure safety when a relative's blood is donated.  Healthy volunteers who are fully evaluated to make sure their blood is safe. This is blood bank blood. Transfusion therapy is the safest it has ever been in the practice of medicine. Before blood is taken from a donor, a complete history is taken to make sure that person has no history of diseases nor engages in risky social behavior (examples are intravenous drug use or sexual activity with multiple partners). The donor's travel history is screened to minimize risk of transmitting infections, such as malaria. The donated blood is tested for signs of infectious diseases, such as HIV and hepatitis. The blood is then tested to be sure it is compatible with you in order to minimize the chance of a transfusion reaction. If you or a relative donates blood, this is often done in anticipation of surgery and is not appropriate for emergency situations. It takes many days to process the donated blood. RISKS AND COMPLICATIONS Although transfusion therapy is very safe and saves many lives, the main dangers of transfusion include:   Getting an infectious disease.  Developing a  transfusion reaction. This is an allergic reaction to something in the blood you were given. Every precaution is taken to prevent this. The decision to have a blood transfusion has been considered carefully by your caregiver before blood is given. Blood is not given unless the benefits outweigh the risks. AFTER THE TRANSFUSION  Right after receiving a blood transfusion, you will usually feel much better and more energetic. This is especially true if your red blood cells have gotten low (anemic). The transfusion raises the level of the red blood cells which carry oxygen, and this usually causes an energy increase.  The nurse administering the transfusion will monitor you carefully for complications. HOME CARE INSTRUCTIONS  No special instructions are needed after a transfusion. You may find your energy is better. Speak with your caregiver about any limitations on activity for underlying diseases you may have. SEEK MEDICAL CARE IF:   Your condition is not improving after your transfusion.  You develop redness or irritation at the intravenous (IV) site. SEEK IMMEDIATE MEDICAL CARE IF:  Any of the following symptoms occur over the next 12 hours:  Shaking chills.  You have a temperature by mouth above 102 F (38.9 C), not controlled by medicine.  Chest, back, or muscle pain.  People around you feel you are not acting correctly or are confused.  Shortness of breath or difficulty breathing.  Dizziness and fainting.  You get a rash or develop hives.  You have a decrease in urine output.  Your urine turns a dark color or changes to pink, red, or [place]. Any of the following   symptoms occur over the next 10 days:  You have a temperature by mouth above 102 F (38.9 C), not controlled by medicine.  Shortness of breath.  Weakness after normal activity.  The white part of the eye turns yellow (jaundice).  You have a decrease in the amount of urine or are urinating less often.  Your  urine turns a dark color or changes to pink, red, or [place]. Document Released: 12/08/2000 Document Revised: 03/04/2012 Document Reviewed: 07/27/2008 [facility] Patient Information 2015 [place], [place]. This information is not intended to replace advice given to you by your health care provider. Make sure you discuss any questions you have with your health care provider. Platelet Transfusion Information This is information about transfusions of platelets. Platelets are tiny cells made by the bone marrow and found in the blood. When a blood vessel is damaged platelets rush to the damaged area to help form a clot. This begins the healing process. When platelets get very low your blood may have trouble clotting. This may be from:  Illness.  Blood disorder.  Chemotherapy to treat cancer. Often lower platelet counts do not usually cause problems.  Platelets usually last for 7 to 10 days. If they are not used not used in an injury, they are broken down by the liver or spleen. Symptoms of low platelet count include:  Nosebleeds.  Bleeding gums.  Heavy periods.  Bruising and tiny blood spots in the skin.  Pin point spots of bleeding are called (petechiae).  Larger bruises (purpura).  Bleeding can be more serious if it happens in the brain or bowel. Platelet transfusions are often used to keep the platelet count at an acceptable level. Serious bleeding due to low platelets is uncommon. RISKS AND COMPLICATIONS Severe side effects from platelet transfusions are uncommon. Minor reactions may include:  Itching.  Rashes.  High temperature and shivering. Medications are available to stop transfusion reactions. Let your caregivers know if you develop any of the above problems.  If you are having platelet transfusions frequently they may get less effective. This is called becoming refractory to platelets. It is uncommon. This can happen from non-immune causes and immune causes. Non-immune causes  include:  High temperatures.  Some medications.  An enlarged spleen. Immune causes happen when your body discovers the platelets are not your own and begin making antibodies against them. The antibodies kill the platelets quickly. Even with platelet transfusions you may still notice problems with bleeding or bruising. Let your caregivers know about this. Other things can be done to help if this happens.  BEFORE THE PROCEDURE   Your doctors will check your platelet count regularly.  If the platelet count is too low it may be necessary to have a platelet transfusion.  This is more important before certain procedures with a risk of bleeding such as a spinal tap.  Platelet transfusion reduces the risk of bleeding during or after the procedure.  Except in emergencies, giving a transfusion requires a written consent. Before blood is taken from a donor, a complete history is taken to make sure the person has no history of previous diseases, nor engages in risky social behavior. Examples of this are intravenous drug use or sexual activity with multiple partners. This could lead to infected blood or blood products being used. This history is done even in spite of the extensive testing to make sure the blood is safe. All blood products transfused are tested to make sure it is a match for the person getting  the blood. It is also checked for infections. Blood is the safest it has ever been. The risk of getting an infection is very low. PROCEDURE  The platelets are stored in small plastic bags which are kept at a low temperature.  Each bag is called a unit and sometimes two units are given. They are given through an intravenous line by drip infusion over about one half hour.  Usually blood is collected from multiple people to get enough to transfuse.  Sometimes, the platelets are collected from a single person. This is done using a special machine that separates the platelets from the blood. The  machine is called an apheresis machine. Platelets collected in this way are called apheresed platelets. Apheresed platelets reduce the risk of becoming sensitive to the platelets. This lowers the chances of having a transfusion reaction.  As it only takes a short time to give the platelets, this treatment can be given in an outpatients department. Platelets can also be given before or after other treatments. SEEK IMMEDIATE MEDICAL CARE IF: Any of the following symptoms over the next 12 hours or several days:  Shaking chills.  Fever with a temperature greater than 102 F (38.9 C) develops.  Back pain or muscle pain.  People around you feel you are not acting correctly, or you are confused.  Blood in the urine or bowel movements or bleeding from any place in your body.  Shortness of breath, or difficulty breathing.  Dizziness.  Fainting.  You break out in a rash or develop hives.  You have a decrease in the amount of urine you are putting out, or the urine turns a dark color or changes to pink, red, or [place].  A severe headache or stiff neck.  Bruising more easily. Document Released: 10/08/2007 Document Revised: 03/04/2012 Document Reviewed: 10/08/2007 [facility] Patient Information 2015 [place], [place]. This information is not intended to replace advice given to you by your health care provider. Make sure you discuss any questions you have with your health care provider.

## 2014-06-26 LAB — TYPE AND SCREEN
ABO/RH(D): O POS
ANTIBODY SCREEN: NEGATIVE
Unit division: 0

## 2014-06-26 LAB — PREPARE PLATELET PHERESIS: Unit division: 0

## 2014-07-02 ENCOUNTER — Other Ambulatory Visit: Payer: Medicare Other

## 2014-07-08 IMAGING — CT CT BIOPSY
2 of 4 series · 11 of 28 positions shown, 15 images · non-contrast
Comparison: none

CT GUIDED RIGHT ILIAC BONE MARROW ASPIRATION AND BONE MARROW CORE
BIOPSIES

Date:  05/15/2013
CLINICAL HISTORY: [85-year-old male with pain and L and inside PE.
He presents today for restaging bone marrow biopsy

[Series 2: rtn a/p w/o · axial · non-contrast · 0.74mm/px · z∈[-114,-34]mm · 8 of 21 slices shown]
[im 3/21  soft-tissue]
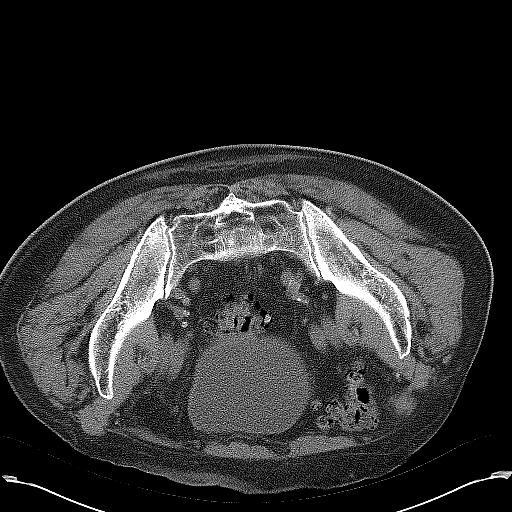
[im 5/21  soft-tissue]
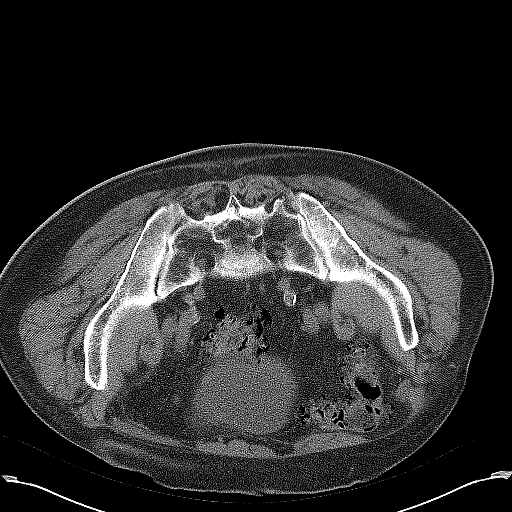
[im 7/21  soft-tissue]
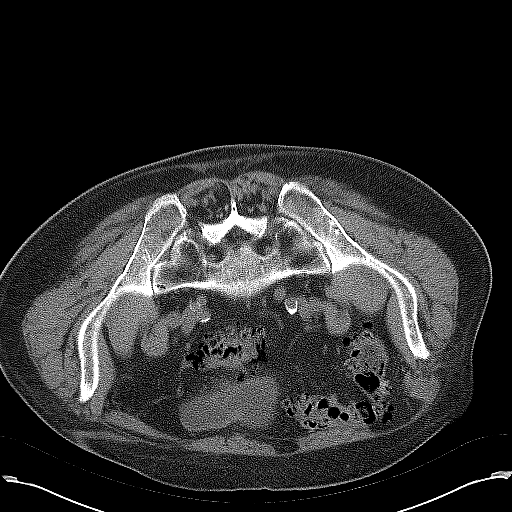
[im 10/21  soft-tissue]
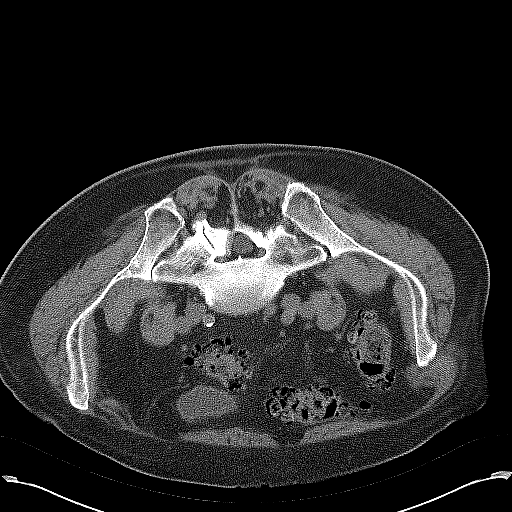
[im 12/21  soft-tissue]
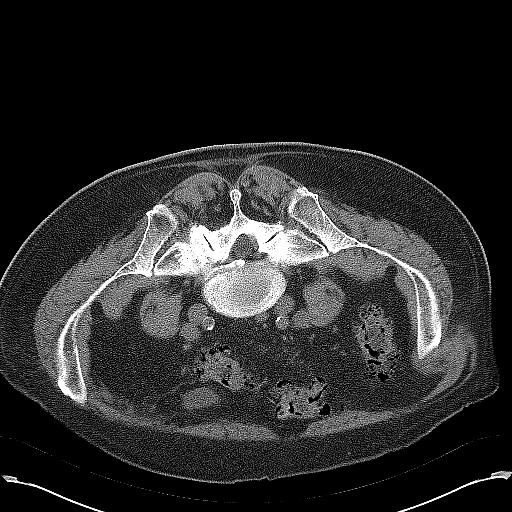
[im 15/21  soft-tissue]
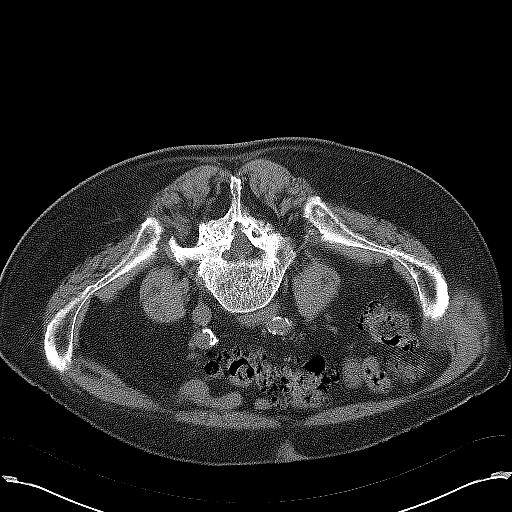
[im 17/21  soft-tissue]
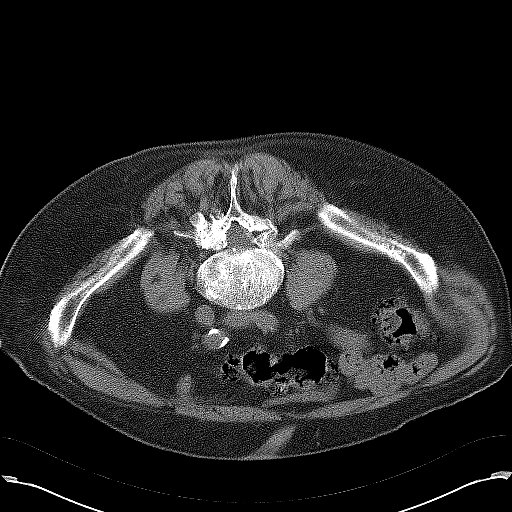
[im 19/21  soft-tissue]
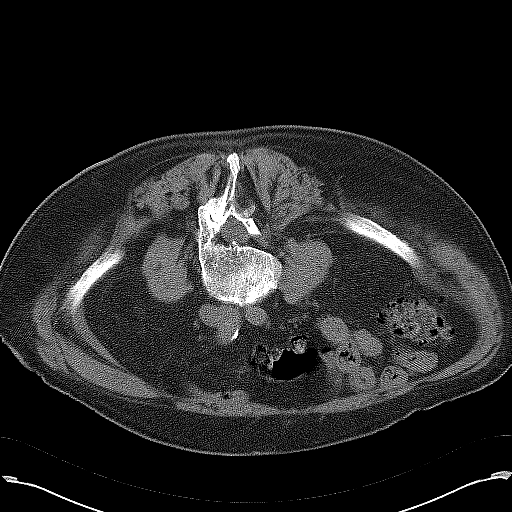

[Series 4: bonemarrow 5.0 b70s · axial · 0.74mm/px · z∈[-94,-84]mm · 3 of 3 slices shown, 7 images]
[im 1/3  soft-tissue]
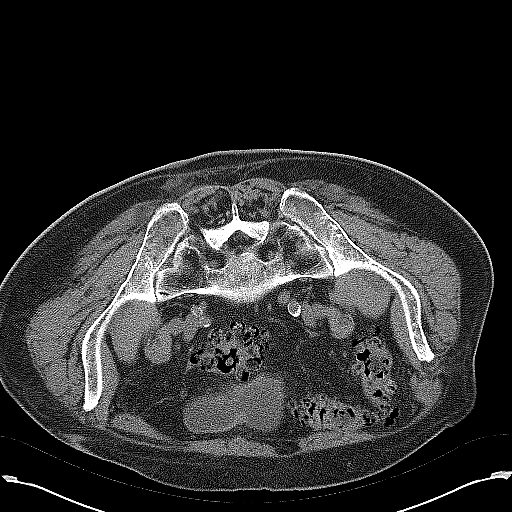
[im 1/3  lung]
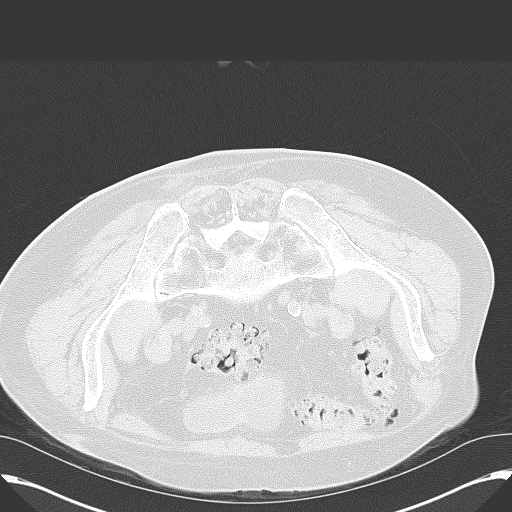
[im 1/3  bone]
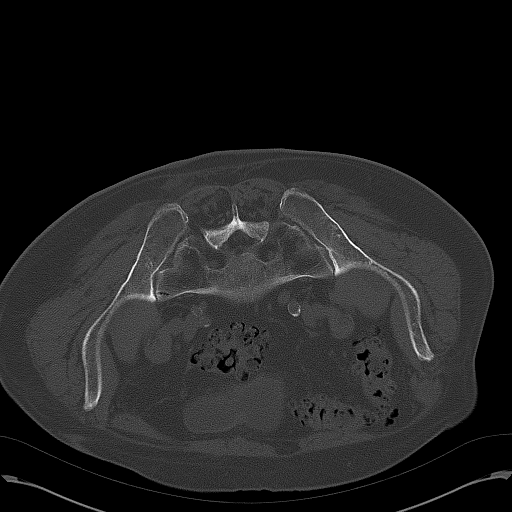
[im 2/3  soft-tissue]
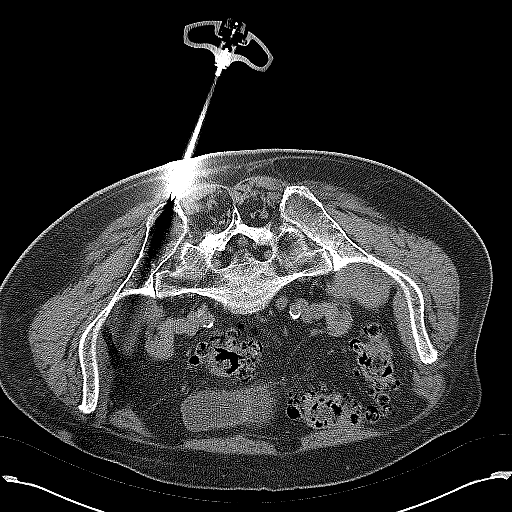
[im 2/3  lung]
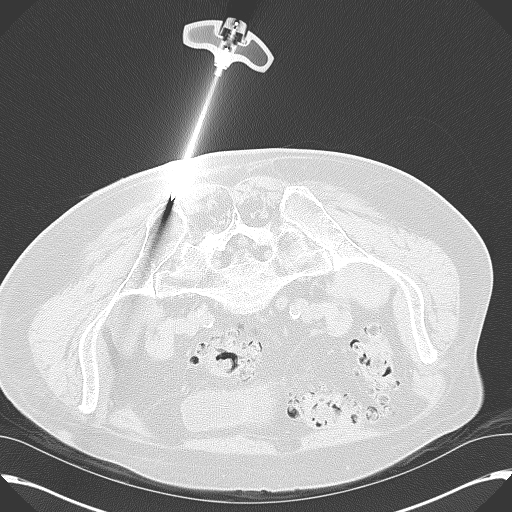
[im 3/3  soft-tissue]
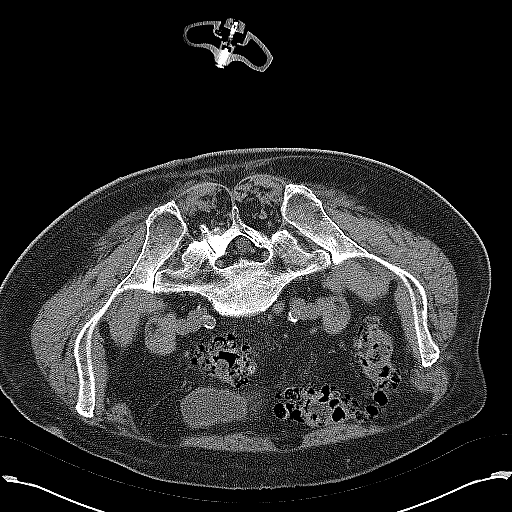
[im 3/3  lung]
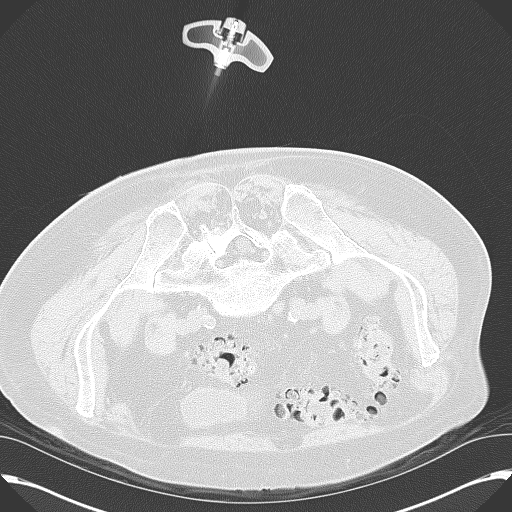

[11 of 28 positions shown; findings below may reference images not displayed]

Procedures Performed:

1. CT guided bone marrow aspiration and core biopsy

Sedation: Moderate (conscious) sedation was used.  One mg Versed,
100 mcg Fentanyl were administered intravenously.  The patient's
vital signs were monitored continuously by radiology nursing
throughout the procedure.

Sedation Time: 9 minutes

PROCEDURE/FINDINGS:

Informed consent was obtained from the patient following
explanation of the procedure, risks, benefits and alternatives.
The patient understands, agrees and consents for the procedure.
All questions were addressed.  A time out was performed.

The patient was positioned prone and noncontrast localization CT
was performed of the pelvis to demonstrate the iliac marrow spaces.

Maximal barrier sterile technique utilized including caps, mask,
sterile gowns, sterile gloves, large sterile drape, hand hygiene,
and betadine prep.

Under sterile conditions and local anesthesia, an 11 gauge coaxial
bone biopsy needle was advanced into the right iliac marrow space.
Needle position was confirmed with CT imaging. Initially, bone
marrow aspiration was performed. Next, the 11 gauge outer cannula
was utilized to obtain a right iliac bone marrow core biopsy.
Needle was removed. Hemostasis was obtained with compression. The
patient tolerated the procedure well. Samples were prepared with
the cytotechnologist. No immediate complications.
IMPRESSION: CT guided right iliac bone marrow aspiration and core biopsy.

[REDACTED]

## 2014-07-15 ENCOUNTER — Ambulatory Visit: Payer: Medicare Other

## 2014-07-25 DEATH — deceased
